# Patient Record
Sex: Female | Born: 1955 | Race: White | Hispanic: No | Marital: Married | Smoking: Former smoker
Health system: Southern US, Community
[De-identification: ages and names within clinical notes are randomized; demographics above are authoritative.]

## PROBLEM LIST (undated history)

## (undated) DIAGNOSIS — E119 Type 2 diabetes mellitus without complications: Secondary | ICD-10-CM

## (undated) DIAGNOSIS — J189 Pneumonia, unspecified organism: Secondary | ICD-10-CM

## (undated) DIAGNOSIS — E282 Polycystic ovarian syndrome: Secondary | ICD-10-CM

## (undated) DIAGNOSIS — I10 Essential (primary) hypertension: Secondary | ICD-10-CM

## (undated) DIAGNOSIS — R011 Cardiac murmur, unspecified: Secondary | ICD-10-CM

## (undated) DIAGNOSIS — K760 Fatty (change of) liver, not elsewhere classified: Secondary | ICD-10-CM

## (undated) DIAGNOSIS — E041 Nontoxic single thyroid nodule: Secondary | ICD-10-CM

## (undated) DIAGNOSIS — T7840XA Allergy, unspecified, initial encounter: Secondary | ICD-10-CM

## (undated) DIAGNOSIS — K219 Gastro-esophageal reflux disease without esophagitis: Secondary | ICD-10-CM

## (undated) DIAGNOSIS — T884XXA Failed or difficult intubation, initial encounter: Secondary | ICD-10-CM

## (undated) DIAGNOSIS — L680 Hirsutism: Secondary | ICD-10-CM

## (undated) DIAGNOSIS — M1990 Unspecified osteoarthritis, unspecified site: Secondary | ICD-10-CM

## (undated) DIAGNOSIS — C50412 Malignant neoplasm of upper-outer quadrant of left female breast: Secondary | ICD-10-CM

## (undated) DIAGNOSIS — R112 Nausea with vomiting, unspecified: Secondary | ICD-10-CM

## (undated) DIAGNOSIS — Z9889 Other specified postprocedural states: Secondary | ICD-10-CM

## (undated) DIAGNOSIS — C50919 Malignant neoplasm of unspecified site of unspecified female breast: Secondary | ICD-10-CM

## (undated) DIAGNOSIS — N920 Excessive and frequent menstruation with regular cycle: Secondary | ICD-10-CM

## (undated) DIAGNOSIS — N926 Irregular menstruation, unspecified: Secondary | ICD-10-CM

## (undated) HISTORY — DX: Cardiac murmur, unspecified: R01.1

## (undated) HISTORY — DX: Polycystic ovarian syndrome: E28.2

## (undated) HISTORY — DX: Irregular menstruation, unspecified: N92.6

## (undated) HISTORY — PX: ABDOMINAL HYSTERECTOMY: SHX81

## (undated) HISTORY — DX: Type 2 diabetes mellitus without complications: E11.9

## (undated) HISTORY — DX: Hirsutism: L68.0

## (undated) HISTORY — PX: CERVICAL CONE BIOPSY: SUR198

## (undated) HISTORY — DX: Pneumonia, unspecified organism: J18.9

## (undated) HISTORY — DX: Essential (primary) hypertension: I10

## (undated) HISTORY — DX: Unspecified osteoarthritis, unspecified site: M19.90

## (undated) HISTORY — DX: Allergy, unspecified, initial encounter: T78.40XA

## (undated) HISTORY — DX: Nontoxic single thyroid nodule: E04.1

## (undated) HISTORY — DX: Malignant neoplasm of unspecified site of unspecified female breast: C50.919

---

## 1979-02-17 HISTORY — PX: DILATION AND CURETTAGE OF UTERUS: SHX78

## 1999-09-30 ENCOUNTER — Encounter: Admission: RE | Admit: 1999-09-30 | Discharge: 1999-10-29 | Payer: Self-pay | Admitting: Occupational Medicine

## 1999-11-07 ENCOUNTER — Encounter: Admission: RE | Admit: 1999-11-07 | Discharge: 2000-02-05 | Payer: Self-pay | Admitting: Occupational Medicine

## 2001-02-17 ENCOUNTER — Encounter: Payer: Self-pay | Admitting: Family Medicine

## 2001-02-17 LAB — CONVERTED CEMR LAB

## 2004-01-22 ENCOUNTER — Emergency Department: Payer: Self-pay | Admitting: General Practice

## 2004-01-25 ENCOUNTER — Ambulatory Visit: Payer: Self-pay | Admitting: Family Medicine

## 2004-02-06 ENCOUNTER — Ambulatory Visit: Payer: Self-pay | Admitting: Family Medicine

## 2004-07-16 ENCOUNTER — Ambulatory Visit: Payer: Self-pay | Admitting: General Surgery

## 2004-07-24 ENCOUNTER — Ambulatory Visit: Payer: Self-pay | Admitting: Family Medicine

## 2004-08-15 ENCOUNTER — Ambulatory Visit: Payer: Self-pay | Admitting: Family Medicine

## 2004-10-03 ENCOUNTER — Ambulatory Visit: Payer: Self-pay | Admitting: Family Medicine

## 2005-03-02 ENCOUNTER — Ambulatory Visit: Payer: Self-pay | Admitting: Family Medicine

## 2005-10-21 ENCOUNTER — Ambulatory Visit: Payer: Self-pay | Admitting: Family Medicine

## 2006-11-29 ENCOUNTER — Encounter: Payer: Self-pay | Admitting: Family Medicine

## 2006-11-29 DIAGNOSIS — N6019 Diffuse cystic mastopathy of unspecified breast: Secondary | ICD-10-CM

## 2006-11-29 DIAGNOSIS — J309 Allergic rhinitis, unspecified: Secondary | ICD-10-CM | POA: Insufficient documentation

## 2007-01-18 ENCOUNTER — Ambulatory Visit: Payer: Self-pay | Admitting: Family Medicine

## 2007-01-18 DIAGNOSIS — E663 Overweight: Secondary | ICD-10-CM

## 2007-01-18 DIAGNOSIS — I10 Essential (primary) hypertension: Secondary | ICD-10-CM

## 2007-01-19 LAB — CONVERTED CEMR LAB
Calcium: 10.2 mg/dL (ref 8.4–10.5)
Cortisol, Plasma: 15.5 ug/dL
GFR calc Af Amer: 98 mL/min
GFR calc non Af Amer: 81 mL/min
TSH: 1.24 microintl units/mL (ref 0.35–5.50)

## 2007-02-24 ENCOUNTER — Ambulatory Visit: Payer: Self-pay | Admitting: Family Medicine

## 2007-02-24 ENCOUNTER — Encounter: Payer: Self-pay | Admitting: Family Medicine

## 2007-02-25 ENCOUNTER — Telehealth: Payer: Self-pay | Admitting: Family Medicine

## 2007-02-28 ENCOUNTER — Ambulatory Visit: Payer: Self-pay | Admitting: Family Medicine

## 2007-02-28 ENCOUNTER — Encounter: Payer: Self-pay | Admitting: Family Medicine

## 2007-03-01 ENCOUNTER — Telehealth: Payer: Self-pay | Admitting: Family Medicine

## 2007-03-02 ENCOUNTER — Encounter (INDEPENDENT_AMBULATORY_CARE_PROVIDER_SITE_OTHER): Payer: Self-pay | Admitting: *Deleted

## 2007-05-20 ENCOUNTER — Ambulatory Visit: Payer: Self-pay | Admitting: Family Medicine

## 2007-05-20 LAB — CONVERTED CEMR LAB
BUN: 11 mg/dL (ref 6–23)
CO2: 30 meq/L (ref 19–32)
Cholesterol: 172 mg/dL (ref 0–200)
Creatinine, Ser: 0.9 mg/dL (ref 0.4–1.2)
Direct LDL: 99.9 mg/dL
HDL: 39 mg/dL (ref >39.0)
Sodium: 141 meq/L (ref 135–145)
Total CHOL/HDL Ratio: 4.4
Triglycerides: 210 mg/dL (ref 0–149)
VLDL: 42 mg/dL — ABNORMAL HIGH (ref 0–40)

## 2007-05-24 ENCOUNTER — Ambulatory Visit: Payer: Self-pay | Admitting: Family Medicine

## 2007-05-24 DIAGNOSIS — M25569 Pain in unspecified knee: Secondary | ICD-10-CM

## 2007-07-14 ENCOUNTER — Ambulatory Visit: Payer: Self-pay | Admitting: Family Medicine

## 2007-07-14 DIAGNOSIS — H1851 Endothelial corneal dystrophy: Secondary | ICD-10-CM

## 2007-07-20 ENCOUNTER — Encounter: Payer: Self-pay | Admitting: Family Medicine

## 2007-07-25 ENCOUNTER — Ambulatory Visit: Payer: Self-pay | Admitting: Family Medicine

## 2007-08-26 ENCOUNTER — Encounter: Payer: Self-pay | Admitting: Family Medicine

## 2007-08-26 ENCOUNTER — Ambulatory Visit: Payer: Self-pay | Admitting: Family Medicine

## 2007-08-30 ENCOUNTER — Encounter (INDEPENDENT_AMBULATORY_CARE_PROVIDER_SITE_OTHER): Payer: Self-pay | Admitting: *Deleted

## 2007-09-13 ENCOUNTER — Ambulatory Visit: Payer: Self-pay | Admitting: Family Medicine

## 2007-09-14 LAB — CONVERTED CEMR LAB
BUN: 10 mg/dL (ref 6–23)
CO2: 27 meq/L (ref 19–32)
Calcium: 10.1 mg/dL (ref 8.4–10.5)
Chloride: 100 meq/L (ref 96–112)
GFR calc Af Amer: 97 mL/min
Magnesium: 2.1 mg/dL (ref 1.5–2.5)
Potassium: 3.7 meq/L (ref 3.5–5.1)

## 2007-10-17 ENCOUNTER — Ambulatory Visit: Payer: Self-pay | Admitting: Family Medicine

## 2007-11-23 ENCOUNTER — Ambulatory Visit: Payer: Self-pay | Admitting: Family Medicine

## 2008-02-27 ENCOUNTER — Ambulatory Visit: Payer: Self-pay | Admitting: Family Medicine

## 2008-02-29 ENCOUNTER — Ambulatory Visit: Payer: Self-pay | Admitting: Family Medicine

## 2008-02-29 ENCOUNTER — Encounter: Payer: Self-pay | Admitting: Family Medicine

## 2008-03-07 ENCOUNTER — Encounter (INDEPENDENT_AMBULATORY_CARE_PROVIDER_SITE_OTHER): Payer: Self-pay | Admitting: *Deleted

## 2008-03-26 ENCOUNTER — Encounter: Payer: Self-pay | Admitting: Family Medicine

## 2008-08-30 ENCOUNTER — Ambulatory Visit: Payer: Self-pay | Admitting: Family Medicine

## 2008-10-04 ENCOUNTER — Ambulatory Visit: Payer: Self-pay | Admitting: Family Medicine

## 2008-10-04 LAB — CONVERTED CEMR LAB
AST: 51 units/L — ABNORMAL HIGH (ref 0–37)
Basophils Absolute: 0 10*3/uL (ref 0.0–0.1)
Basophils Relative: 0.5 % (ref 0.0–3.0)
Bilirubin, Direct: 0 mg/dL (ref 0.0–0.3)
CO2: 28 meq/L (ref 19–32)
Chloride: 101 meq/L (ref 96–112)
Cholesterol: 212 mg/dL — ABNORMAL HIGH (ref 0–200)
Direct LDL: 49.7 mg/dL
Eosinophils Absolute: 0.1 10*3/uL (ref 0.0–0.7)
Eosinophils Relative: 1.2 % (ref 0.0–5.0)
HDL: 35.1 mg/dL — ABNORMAL LOW (ref >39.00)
Hemoglobin: 16 g/dL — ABNORMAL HIGH (ref 12.0–15.0)
Lymphs Abs: 1.6 10*3/uL (ref 0.7–4.0)
Neutro Abs: 6.5 10*3/uL (ref 1.4–7.7)
Potassium: 3.8 meq/L (ref 3.5–5.1)
RBC: 4.89 M/uL (ref 3.87–5.11)
Sodium: 138 meq/L (ref 135–145)
Total CHOL/HDL Ratio: 6
VLDL: 54.8 mg/dL — ABNORMAL HIGH (ref 0.0–40.0)

## 2008-10-09 ENCOUNTER — Ambulatory Visit: Payer: Self-pay | Admitting: Family Medicine

## 2008-10-09 DIAGNOSIS — E559 Vitamin D deficiency, unspecified: Secondary | ICD-10-CM

## 2008-10-11 LAB — CONVERTED CEMR LAB: Vit D, 25-Hydroxy: 7 ng/mL — ABNORMAL LOW (ref 30–89)

## 2008-11-08 ENCOUNTER — Ambulatory Visit: Payer: Self-pay | Admitting: Internal Medicine

## 2008-11-15 ENCOUNTER — Encounter: Payer: Self-pay | Admitting: Family Medicine

## 2008-12-06 ENCOUNTER — Encounter: Admission: RE | Admit: 2008-12-06 | Discharge: 2008-12-06 | Payer: Self-pay | Admitting: Endocrinology

## 2009-01-14 ENCOUNTER — Ambulatory Visit: Payer: Self-pay | Admitting: Family Medicine

## 2009-01-15 LAB — CONVERTED CEMR LAB: Vit D, 25-Hydroxy: 15 ng/mL — ABNORMAL LOW (ref 30–89)

## 2009-01-17 ENCOUNTER — Ambulatory Visit: Payer: Self-pay | Admitting: Family Medicine

## 2009-01-24 ENCOUNTER — Encounter: Admission: RE | Admit: 2009-01-24 | Discharge: 2009-02-13 | Payer: Self-pay | Admitting: Endocrinology

## 2009-03-12 ENCOUNTER — Encounter: Admission: RE | Admit: 2009-03-12 | Discharge: 2009-03-12 | Payer: Self-pay | Admitting: Endocrinology

## 2009-04-22 ENCOUNTER — Telehealth: Payer: Self-pay | Admitting: Family Medicine

## 2009-04-23 ENCOUNTER — Ambulatory Visit: Payer: Self-pay | Admitting: Family Medicine

## 2009-04-24 LAB — CONVERTED CEMR LAB: Vit D, 25-Hydroxy: 25 ng/mL — ABNORMAL LOW (ref 30–89)

## 2009-04-25 ENCOUNTER — Ambulatory Visit: Payer: Self-pay | Admitting: Family Medicine

## 2009-04-26 ENCOUNTER — Encounter: Payer: Self-pay | Admitting: Family Medicine

## 2009-04-26 LAB — HM DIABETES EYE EXAM: HM Diabetic Eye Exam: NORMAL

## 2009-05-06 ENCOUNTER — Encounter: Payer: Self-pay | Admitting: Family Medicine

## 2009-05-09 ENCOUNTER — Encounter: Payer: Self-pay | Admitting: Family Medicine

## 2009-05-09 ENCOUNTER — Ambulatory Visit: Payer: Self-pay | Admitting: Family Medicine

## 2009-05-13 ENCOUNTER — Encounter (INDEPENDENT_AMBULATORY_CARE_PROVIDER_SITE_OTHER): Payer: Self-pay | Admitting: *Deleted

## 2009-05-20 ENCOUNTER — Encounter: Payer: Self-pay | Admitting: Family Medicine

## 2009-06-24 ENCOUNTER — Ambulatory Visit: Payer: Self-pay

## 2009-06-25 ENCOUNTER — Ambulatory Visit: Payer: Self-pay

## 2009-06-25 HISTORY — PX: DILATION AND CURETTAGE OF UTERUS: SHX78

## 2009-07-08 ENCOUNTER — Ambulatory Visit: Payer: Self-pay | Admitting: Family Medicine

## 2009-07-23 ENCOUNTER — Encounter: Admission: RE | Admit: 2009-07-23 | Discharge: 2009-07-23 | Payer: Self-pay | Admitting: Endocrinology

## 2009-09-23 ENCOUNTER — Encounter (INDEPENDENT_AMBULATORY_CARE_PROVIDER_SITE_OTHER): Payer: Self-pay | Admitting: *Deleted

## 2009-10-11 ENCOUNTER — Ambulatory Visit: Payer: Self-pay | Admitting: Gastroenterology

## 2009-10-11 ENCOUNTER — Encounter: Payer: Self-pay | Admitting: Family Medicine

## 2009-10-15 LAB — PATHOLOGY REPORT

## 2009-11-21 ENCOUNTER — Encounter
Admission: RE | Admit: 2009-11-21 | Discharge: 2009-11-21 | Payer: Self-pay | Source: Home / Self Care | Attending: Endocrinology | Admitting: Endocrinology

## 2010-01-14 ENCOUNTER — Ambulatory Visit: Payer: Self-pay | Admitting: Family Medicine

## 2010-01-14 LAB — CONVERTED CEMR LAB
ALT: 33 units/L (ref 0–35)
Albumin: 4.2 g/dL (ref 3.5–5.2)
Alkaline Phosphatase: 61 units/L (ref 39–117)
Basophils Relative: 0.3 % (ref 0.0–3.0)
CO2: 28 meq/L (ref 19–32)
Cholesterol: 226 mg/dL — ABNORMAL HIGH (ref 0–200)
Direct LDL: 115.1 mg/dL
Eosinophils Absolute: 0.1 10*3/uL (ref 0.0–0.7)
HCT: 46.6 % — ABNORMAL HIGH (ref 36.0–46.0)
Hemoglobin: 16.2 g/dL — ABNORMAL HIGH (ref 12.0–15.0)
Lymphs Abs: 1.5 10*3/uL (ref 0.7–4.0)
MCHC: 34.7 g/dL (ref 30.0–36.0)
MCV: 100.4 fL — ABNORMAL HIGH (ref 78.0–100.0)
Monocytes Absolute: 0.7 10*3/uL (ref 0.1–1.0)
Monocytes Relative: 8 % (ref 3.0–12.0)
Neutro Abs: 5.9 10*3/uL (ref 1.4–7.7)
Potassium: 4.5 meq/L (ref 3.5–5.1)
RDW: 12.4 % (ref 11.5–14.6)
TSH: 0.66 microintl units/mL (ref 0.35–5.50)
Total CHOL/HDL Ratio: 5
Triglycerides: 218 mg/dL — ABNORMAL HIGH (ref 0.0–149.0)
WBC: 8.2 10*3/uL (ref 4.5–10.5)

## 2010-01-16 ENCOUNTER — Ambulatory Visit: Payer: Self-pay | Admitting: Family Medicine

## 2010-02-18 ENCOUNTER — Telehealth: Payer: Self-pay | Admitting: Family Medicine

## 2010-03-18 NOTE — Assessment & Plan Note (Signed)
Summary: 4 MONTH FOLLOW UP/[facility]   Vital Signs:  Patient profile:   55 year old female Weight:      212.25 pounds Temp:     99.0 degrees F oral Pulse rate:   80 / minute Pulse rhythm:   regular BP sitting:   148 / 78  (left arm) Cuff size:   large  Vitals Entered By: Sydell Axon LPN (April 25, 2009 3:23 PM) CC: 4 Month follow-up after labs, had nausea and diarrhea Sunday thru Monday   History of Present Illness: Alexandria Fry is a 55 y/o caucasian female who presents today for a 4 month follow up for Vitamin D labs.  7 months ago her Vit D was measured at 7 and 4 months ago was measured at 15.  She's been taking weekly Vit. D supplements (Ergocalciferol) for the last 7 months.  Has missed 2 doses recently, one was forgotten and the other due to illness.  Over the weekend she experienced nausea and vomiting and was instructed to take Immodium.  She took one dose and has since felt much better. However she had blood with her last bout of diarrhea with an internal pop and then two small blood clots and no further blood. She still hurts in the rectal area after sitting or after prolonged standing.  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 Take 1 Tablet  By Mouth Once A Day 2)  Flonase 50 Mcg/act  Susp (Fluticasone Propionate) .... One Squirt Each Nostril Twice A Day 3)  Metoprolol Succinate 50 Mg Xr24h-Tab (Metoprolol Succinate) .... Take 1/2  By Mouth in The Morning and 1 By Mouth in The Evening 4)  Ergocalciferol 50000 Unit Caps (Ergocalciferol) .... One Tab By Mouth Once A Week 5)  Levothyroxine Sodium 50 Mcg Tabs (Levothyroxine Sodium) .... Take One By Mouth Daily 6)  Metformin Hcl 500 Mg Tabs (Metformin Hcl) .... Take One By Mouth Daily  Allergies: 1)  ! Macrobid (Nitrofurantoin Monohyd Macro) 2)  ! Benadryl (Diphenhydramine-Zinc Acetate) 3)  ! Hydrocodone-Acetaminophen (Hydrocodone-Acetaminophen) 4)  ! Tylox (Oxycodone-Acetaminophen) 5)  ! Augmentin  Physical Exam  General:  Well-developed,well-nourished,in no acute distress; alert,appropriate and cooperative throughout examination Head:  Normocephalic and atraumatic without obvious abnormalities. No apparent alopecia or balding. Sinuses NT. Eyes:  Conjunctiva clear bilaterally.  Ears:  External ear exam shows no significant lesions or deformities.  Otoscopic examination reveals clear canals, tympanic membranes are intact bilaterally without bulging, retraction, inflammation or discharge. Hearing is grossly normal bilaterally. Nose:  External nasal examination shows no deformity or inflammation. Nasal mucosa are pink and moist without lesions or exudates. Mouth:  Oral mucosa and oropharynx without lesions or exudates. Pharynx significantly erythematous with some exudate.Teeth in good repair. Neck:  No deformities, masses, or tenderness noted. Lungs:  Normal respiratory effort, chest expands symmetrically. Lungs are clear to auscultation, no crackles or wheezes. Heart:  Normal rate and regular rhythm. S1 and S2 normal without gallop, murmur,  click, rub or other extra sounds. Abdomen:  Bowel sounds positive,abdomen soft and non-tender without masses, organomegaly or hernias noted. Rectal:  Declined.   Impression & Recommendations:  Problem # 1:  DIARRHEA, BLOODY (ICD-787.91) Assessment Improved Gastroenteritis is better but had episode of probable int hemms bleeding and  decompressing. Feel she should be seen by GI for colonoscopy. Orders: Gastroenterology Referral (GI)  Problem # 2:  OTHER SCREENING MAMMOGRAM (ICD-V76.12) Assessment: Unchanged Due mammo and will order. Is currently back to screening. Orders: Radiology Referral (Radiology)  Problem # 3:  UNSPECIFIED VITAMIN D DEFICIENCY (ICD-268.9) Assessment: Improved Vit D therapeutic. Will go to Vit D 1000Iu two times a day and recheck in te future.  Problem # 4:  HYPERTENSION (ICD-401.9) Assessment: Deteriorated Slightly high but wants to hold off increasing meds. Will follow and reassesss next time. Her updated medication list for this problem includes:    Maxzide-25 37.5-25 Mg Tabs (Triamterene-hctz) .Marland Kitchen... Take 1 tablet  by mouth once a day    Metoprolol Succinate 50 Mg Xr24h-tab (Metoprolol succinate) .Marland Kitchen... Take 1/2 by mouth in the morning and 1 by mouth in the evening  BP today: 148/78 Prior BP: 128/84 (01/17/2009)  Labs Reviewed: K+: 3.8 (10/04/2008) Creat: : 0.8 (10/04/2008)   Chol: 212 (10/04/2008)   HDL: 35.10 (10/04/2008)   LDL: DEL (05/20/2007)   TG: 274.0 (10/04/2008)  Complete Medication List: 1)  Maxzide-25 37.5-25 Mg Tabs (Triamterene-hctz) .... Take 1 tablet  by mouth once a day 2)  Flonase 50 Mcg/act Susp (Fluticasone propionate) .... One squirt each nostril twice a day 3)  Metoprolol Succinate 50 Mg Xr24h-tab (Metoprolol succinate) .... Take 1/2 by mouth in the morning and 1 by mouth in the evening 4)  Ergocalciferol 50000 Unit Caps (Ergocalciferol) .... One tab by mouth once a week 5)  Levothyroxine Sodium 50 Mcg Tabs (Levothyroxine sodium) .... Take one by mouth daily 6)  Metformin Hcl 500 Mg Tabs (Metformin hcl) .... Take one by mouth two times a day 7)  Anusol 1-12.5 % Oint (Pramoxine-zinc oxide in mo) .... Use as directed 8)  Anusol-hc 25 Mg Supp (Hydrocortisone acetate) .... Insert per rectum three times a day  Patient Instructions: 1)  Refer for mammo 2)  Go to Vit  D 1000Iu three times a day  3)  RTC Oct for Comp Exam with Pap. Check Vit D then. 4)  Recheck BP. Prescriptions: ANUSOL-HC 25 MG SUPP (HYDROCORTISONE ACETATE) insert Per rectum three times a day  #30 x 0   Entered and Authorized by:   Shaune Leeks MD   Signed by:   Shaune Leeks MD on 04/25/2009   Method used:   Electronically to        [id number] (retail)       [address]       [place], [place]  [address]       Ph: [phone]       Fax: [phone]   RxID:   [id number] ANUSOL 1-12.5 % OINT (PRAMOXINE-ZINC OXIDE IN MO) use as directed  #30 x 0   Entered and Authorized by:   Shaune Leeks MD   Signed by:   Shaune Leeks MD on 04/25/2009   Method used:   Electronically to        [id number] (retail)       [address]       [place], [place]  [address]       Ph:  [phone]       Fax: [phone]   RxID:   [id number]   Current Allergies (reviewed today): ! MACROBID (NITROFURANTOIN MONOHYD MACRO) ! BENADRYL (DIPHENHYDRAMINE-ZINC ACETATE) ! HYDROCODONE-ACETAMINOPHEN (HYDROCODONE-ACETAMINOPHEN) ! TYLOX (OXYCODONE-ACETAMINOPHEN) ! AUGMENTIN

## 2010-03-18 NOTE — Consult Note (Signed)
Summary: Alexandria Fry  Vikki Metheny,ANP-BC,GI,Note   Imported By: Beau Fanny 05/31/2009 10:29:09  _____________________________________________________________________  External Attachment:    Type:   Image     Comment:   External Document

## 2010-03-18 NOTE — Letter (Signed)
Summary: [facility],Note  Dr.Bindubal Balan,[facility],Note   Imported By: Beau Fanny 05/13/2009 16:42:19  _____________________________________________________________________  External Attachment:    Type:   Image     Comment:   External Document

## 2010-03-18 NOTE — Procedures (Signed)
Summary: Colonoscopy by Dr.Martin [facility]  Colonoscopy by Dr.Martin [facility]   Imported By: Beau Fanny 10/16/2009 16:30:57  _____________________________________________________________________  External Attachment:    Type:   Image     Comment:   External Document  Appended Document: Colonoscopy by Dr.Martin [facility]    Clinical Lists Changes  Observations: Added new observation of COLPO: 4 polyps removed  (10/11/2009 20:42)       Colposcopy  Procedure date:  10/11/2009  Findings:      4 polyps removed

## 2010-03-18 NOTE — Assessment & Plan Note (Signed)
Summary: CPX/CLE   Vital Signs:  Patient profile:   55 year old female Weight:      191.75 pounds Temp:     98.5 degrees F oral Pulse rate:   60 / minute Pulse rhythm:   regular BP sitting:   146 / 76  (left arm) Cuff size:   large  Vitals Entered By: Sydell Axon LPN 02-03-10 9:20 AM) CC: 30  Minute checkup, sees Dr. Luella Cook for her GYN care   History of Present Illness: Pt here for Comp Exam. She has lost almost 30 pounds.  Is walking and eating more carefully.  She has had colonoscopy and D&C, had propofalol as anesthetic and had bad sxs afterward...vertigo was the worst. It finally wore off and she is now feeling back to nml. She today is having probs with the right ear with accentuated heartbeat. She also has left shoulder pain generally.   Preventive Screening-Counseling & Management  Alcohol-Tobacco     Alcohol drinks/day: 2     Alcohol type: wine, mixed drink     Smoking Status: never     Passive Smoke Exposure: no  Caffeine-Diet-Exercise     Caffeine use/day: 1     Does Patient Exercise: yes     Type of exercise: walking     Exercise (avg: min/session): 30-60     Times/week: 7  Problems Prior to Update: 1)  Allergic Reaction (PRINCIPALLY SULFA)  (ICD-995.3) 2)  Diarrhea, Bloody  (ICD-787.91) 3)  Other Screening Mammogram  (ICD-V76.12) 4)  Hypertension  (ICD-401.9) 5)  Health Maintenance Exam  (ICD-V70.0) 6)  Unspecified Vitamin D Deficiency  (ICD-268.9) 7)  Ingrown Nail, R Gt Toe  (ICD-703.0) 8)  Strep Throat  (ICD-034.0) 9)  Fuch's Dystrophy  (ICD-371.57) 10)  Knee Pain, Left, Chronic  (ICD-719.46) 11)  Hypertension, Benign Essential  (ICD-401.1) 12)  Overweight  (ICD-278.02) 13)  Fibrocystic Breast Disease  (ICD-610.1) 14)  Mitral Valve Prolapse, Negative By Echo  (ICD-424.0) 15)  Allergic Rhinitis  (ICD-477.9)  Medications Prior to Update: 1)  Flonase 50 Mcg/act  Susp (Fluticasone Propionate) .... One Squirt Each Nostril Twice A Day 2)   Metoprolol Succinate 50 Mg Xr24h-Tab (Metoprolol Succinate) .... Take 1/2 By Mouth in The Morning and 1 By Mouth in The Evening 3)  Levothyroxine Sodium 50 Mcg Tabs (Levothyroxine Sodium) .... Take One By Mouth Daily 4)  Vitamin D 1000 Unit Tabs (Cholecalciferol) .... Take 3 By Mouth Daily  Current Medications (verified): 1)  Flonase 50 Mcg/act  Susp (Fluticasone Propionate) .... One Squirt Each Nostril Twice A Day 2)  Metoprolol Succinate 50 Mg Xr24h-Tab (Metoprolol Succinate) .... Take One By Mouth Two Times A Day 3)  Levothyroxine Sodium 50 Mcg Tabs (Levothyroxine Sodium) .... Take One By Mouth Daily 4)  Vitamin D 1000 Unit Tabs (Cholecalciferol) .... Take 3 By Mouth Daily  Allergies: 1)  ! Macrobid (Nitrofurantoin Monohyd Macro) 2)  ! Benadryl (Diphenhydramine-Zinc Acetate) 3)  ! Hydrocodone-Acetaminophen (Hydrocodone-Acetaminophen) 4)  ! Tylox (Oxycodone-Acetaminophen) 5)  ! Augmentin 6)  ! Sulfa 7)  ! Maxzide (Triamterene-Hctz) 8)  ! Bactrim Ds (Sulfamethoxazole-Trimethoprim)  Past History:  Past Medical History: Last updated: 11/08/2008 Allergic rhinitis Hypertension  Family History: Last updated: 02-03-10 Father: Died at age 16, pancreatic cancer,  he also had colon polyps Mother: A 26  Breast cancer with a mastectomy at age 60 that was hormone receptor positive.  She also had rectal cancer at age 11 with a proctectomy, ileostomy and colectomy. Multiple Polyps.  DM Brother A 50 Sister A 51 CV - none HBP + Mother DM + Mother Pancreatic cancer + Father Breast cancer + Mother Colon cancer + Mother Depression - none ETOH/Drug abuse + Father/ GF Strokes - none  Social History: Last updated: 11/29/2006 Marital Status: Married Children: 2 Occupation: Production manager for [employer]  Risk Factors: Alcohol Use: 2 (01/16/2010) Caffeine Use: 1 (01/16/2010) Exercise: yes (01/16/2010)  Risk Factors: Smoking Status: never (01/16/2010) Passive Smoke Exposure: no  (01/16/2010)  Past Surgical History: NSVD x 2 D&C [facility] BX 1981 ECHO, WNL, no MVP 06/15/00 D&C Nml (Dr Luella Cook) 06/25/2009 Colonoscopy  3 polyps benign (Dr Marva Panda) 10/11/2009  Family History: Father: Died at age 85, pancreatic cancer,  he also had colon polyps Mother: A 49  Breast cancer with a mastectomy at age 69 that was hormone receptor positive.  She also had rectal cancer at age 66 with a proctectomy, ileostomy and colectomy. Multiple Polyps. DM Brother A 68 Sister A 51 CV - none HBP + Mother DM + Mother Pancreatic cancer + Father Breast cancer + Mother Colon cancer + Mother Depression - none ETOH/Drug abuse + Father/ GF Strokes - none  Social History: Caffeine use/day:  1 Does Patient Exercise:  yes  Review of Systems General:  Denies chills, fatigue, fever, sweats, weakness, and weight loss. Eyes:  Denies blurring, discharge, and eye pain. ENT:  Denies decreased hearing, earache, nosebleeds, and ringing in ears; fullness in right ear.. CV:  Complains of swelling of feet; denies chest pain or discomfort, fainting, fatigue, palpitations, shortness of breath with exertion, and swelling of hands; improved, L swells more than right. Resp:  Denies cough, pleuritic, and wheezing. GI:  Denies abdominal pain, bloody stools, change in bowel habits, constipation, dark tarry stools, diarrhea, indigestion, loss of appetite, nausea, vomiting, vomiting blood, and yellowish skin color; occa BRBPR from fissure.. GU:  Denies discharge, dysuria, nocturia, and urinary frequency. MS:  Complains of joint pain and stiffness; denies low back pain, muscle aches, and cramps; left shoulder. Derm:  Complains of dryness; denies itching and rash; improved. Neuro:  Denies numbness, poor balance, tingling, and tremors; middle fingers sometimes go numb.Marland Kitchen  Physical Exam  General:  Well-developed,well-nourished,in no acute distress; alert,appropriate and cooperative throughout examination Head:   Normocephalic and atraumatic without obvious abnormalities. No apparent alopecia or balding. Sinuses NT. Eyes:  Conjunctiva clear bilaterally.  Ears:  External ear exam shows no significant lesions or deformities.  Otoscopic examination reveals clear canals, tympanic membranes are intact bilaterally without bulging, retraction, inflammation or discharge. Hearing is grossly normal bilaterally. L TM mildly dull to LR. Nose:  External nasal examination shows no deformity or inflammation. Nasal mucosa are pink and moist without lesions or exudates. Mouth:  Oral mucosa and oropharynx without lesions or exudates. Pharynx significantly erythematous with some exudate.Teeth in good repair. Neck:  No deformities, masses, or tenderness noted. Chest Wall:  No deformities, masses, or tenderness noted. Breasts:  per Dr Luella Cook Lungs:  Normal respiratory effort, chest expands symmetrically. Lungs are clear to auscultation, no crackles or wheezes. Good air exchange. Heart:  Normal rate and regular rhythm. S1 and S2 normal without gallop, murmur, click, rub or other extra sounds. Abdomen:  Bowel sounds positive,abdomen soft and non-tender without masses, organomegaly or hernias noted. Rectal:  per Dr Fara Olden:  per Dr Luella Cook. Msk:  No deformity or scoliosis noted of thoracic or lumbar spine.   Pulses:  R and L carotid,radial,femoral,dorsalis pedis and posterior tibial pulses are full and equal  bilaterally Extremities:  L shoulder with slightly decr ROM, mostly extension and ext rotation. Neurologic:  No cranial nerve deficits noted. Station and gait are normal. Sensory, motor and coordinative functions appear intact. Skin:  Intact without suspicious lesions or rashes, a few focally erythem and swollen patches on the left wrist and arms. Cervical Nodes:  No lymphadenopathy noted Inguinal Nodes:  No significant adenopathy Psych:  Cognition and judgment appear intact. Alert and cooperative with normal  attention span and concentration. No apparent delusions, illusions, hallucinations   Impression & Recommendations:  Problem # 1:  HEALTH MAINTENANCE EXAM (ICD-V70.0) Assessment Comment Only  Problem # 2:  HYPERTENSION, BENIGN ESSENTIAL (ICD-401.1) Assessment: Deteriorated Will observe. She takes her BP at home and has been stable. Her updated medication list for this problem includes:    Metoprolol Succinate 50 Mg Xr24h-tab (Metoprolol succinate) .Marland Kitchen... Take one by mouth two times a day  BP today: 146/76 Prior BP: 124/74 (07/08/2009)  Labs Reviewed: K+: 4.5 (01/14/2010) Creat: : 0.7 (01/14/2010)   Chol: 226 (01/14/2010)   HDL: 44.10 (01/14/2010)   LDL: DEL (05/20/2007)   TG: 218.0 (01/14/2010)  Problem # 3:  UNSPECIFIED VITAMIN D DEFICIENCY (ICD-268.9) Assessment: Unchanged Minimally decreased. Try to remember better to take the pill.  Problem # 4:  INGROWN NAIL, R GT TOE (ICD-703.0) Assessment: Unchanged She thinks it may need partial removal again with phenol trmt to the matrix.  Problem # 5:  KNEE PAIN, LEFT, CHRONIC (ICD-719.46) Assessment: Unchanged Discussed chronic exercises to do.   Problem # 6:  OVERWEIGHT (ICD-278.02) Assessment: Improved Has lost weight by hard work. Congrats! Cont efforts.   BP today: 146/76 Prior BP: 124/74 (07/08/2009)  Labs Reviewed: K+: 4.5 (01/14/2010) Creat: : 0.7 (01/14/2010)   Chol: 226 (01/14/2010)   HDL: 44.10 (01/14/2010)   LDL: DEL (05/20/2007)   TG: 218.0 (01/14/2010)  Problem # 7:  ALLERGIC RHINITIS (ICD-477.9) Assessment: Unchanged Cont meds as needed. Her updated medication list for this problem includes:    Flonase 50 Mcg/act Susp (Fluticasone propionate) ..... One squirt each nostril twice a day  Discussed use of allergy medications and environmental measures.   Complete Medication List: 1)  Flonase 50 Mcg/act Susp (Fluticasone propionate) .... One squirt each nostril twice a day 2)  Metoprolol Succinate 50 Mg  Xr24h-tab (Metoprolol succinate) .... Take one by mouth two times a day 3)  Levothyroxine Sodium 50 Mcg Tabs (Levothyroxine sodium) .... Take one by mouth daily 4)  Vitamin D 1000 Unit Tabs (Cholecalciferol) .... Take 3 by mouth daily  Other Orders: Tdap => 72yrs IM (30865) Admin 1st Vaccine (78469)  Patient Instructions: 1)  RTC 6 mos, sooner as needed.   Orders Added: 1)  Tdap => 69yrs IM [90715] 2)  Admin 1st Vaccine [90471] 3)  Est. Patient 40-64 years [99396]   Immunization History:  Influenza Immunization History:    Influenza:  historical (11/27/2009)  Immunizations Administered:  Tetanus Vaccine:    Vaccine Type: Tdap    Site: left deltoid    Mfr: GlaxoSmithKline    Dose: 0.5 ml    Route: IM    Given by: Mervin Hack CMA (AAMA)    Exp. Date: 12/06/2011    Lot #: [id number]    VIS given: 01/04/08 version given January 16, 2010.   Immunization History:  Influenza Immunization History:    Influenza:  Historical (11/27/2009)  Immunizations Administered:  Tetanus Vaccine:    Vaccine Type: Tdap    Site: left deltoid    Mfr:  GlaxoSmithKline    Dose: 0.5 ml    Route: IM    Given by: Mervin Hack CMA (AAMA)    Exp. Date: 12/06/2011    Lot #: [id number]    VIS given: 01/04/08 version given January 16, 2010.  Current Allergies (reviewed today): ! MACROBID (NITROFURANTOIN MONOHYD MACRO) ! BENADRYL (DIPHENHYDRAMINE-ZINC ACETATE) ! HYDROCODONE-ACETAMINOPHEN (HYDROCODONE-ACETAMINOPHEN) ! TYLOX (OXYCODONE-ACETAMINOPHEN) ! AUGMENTIN ! SULFA ! MAXZIDE (TRIAMTERENE-HCTZ) ! BACTRIM DS (SULFAMETHOXAZOLE-TRIMETHOPRIM)

## 2010-03-18 NOTE — Progress Notes (Signed)
Summary: diarrhea  Phone Note Call from Patient Call back at Home Phone [phone] Call back at [phone]   Caller: Patient Call For: Shaune Leeks MD Summary of Call: Pt has been on metformin since december, dose was increased in january.  She has had some diarrhea since starting this but developed watery diarrhea yesterday.  She is asking what she can take for this.  She wants to take kayopectate, but bottle says not to take if diabetic.  Please advise. Initial call taken by: Lowella Petties CMA,  April 22, 2009 8:11 AM  Follow-up for Phone Call        Use Immodium, 2 to start and one every 4-6 hrs if diarrhea conts. Every day is new cycle, start with 2 in AM. See me if continues. Follow-up by: Shaune Leeks MD,  April 22, 2009 8:43 AM  Additional Follow-up for Phone Call Additional follow up Details #1::        Advised pt. Additional Follow-up by: Lowella Petties CMA,  April 22, 2009 9:46 AM

## 2010-03-18 NOTE — Letter (Signed)
Summary: Dr.Frank Spaeth,[facility],Primary Care Provider Repo  Dr.Frank Spaeth,[facility] Care Provider Report   Imported By: Beau Fanny 05/31/2009 16:24:00  _____________________________________________________________________  External Attachment:    Type:   Image     Comment:   External Document  Appended Document: Dr.Frank Spaeth,[facility] Care Provider Repo    Clinical Lists Changes  Observations: Added new observation of DMEYEEXAMNXT: 04/2010 (06/02/2009 13:52) Added new observation of DMEYEEXMRES: normal (04/26/2009 13:53) Added new observation of EYE EXAM BY: Dr Marlyne Beards (04/26/2009 13:53) Added new observation of DIAB EYE EX: normal (04/26/2009 13:53)        Diabetes Management Exam:    Eye Exam:       Eye Exam done elsewhere          Date: 04/26/2009          Results: normal          Done by: Dr Marlyne Beards

## 2010-03-18 NOTE — Letter (Signed)
Summary: Results Follow up Letter  [place] [facility]  [address] [place], [place] [address]   Phone: [phone]  Fax: [phone]    05/13/2009 MRN: [id number]     Alexandria Fry [address] [place], [place]  [address]    Dear Alexandria Fry,  The following are the results of your recent test(s):  Test         Result    Pap Smear:        Normal _____  Not Normal _____ Comments: ______________________________________________________ Cholesterol: LDL(Bad cholesterol):         Your goal is less than:         HDL (Good cholesterol):       Your goal is more than: Comments:  ______________________________________________________ Mammogram:        Normal __x___  Not Normal _____ Comments:Repeat in 1 year  ___________________________________________________________________ Hemoccult:        Normal _____  Not normal _______ Comments:    _____________________________________________________________________ Other Tests:    We routinely do not discuss normal results over the telephone.  If you desire a copy of the results, or you have any questions about this information we can discuss them at your next office visit.   Sincerely,    Laurita Quint MD

## 2010-03-18 NOTE — Letter (Signed)
Summary: Alexandria Fry letter  [place] [facility]  [address] [place], [place] [address]   Phone: [phone]  Fax: [phone]       09/23/2009 MRN: [id number]  LYNNMARIE LOVETT [address] [place], [place]  [address]  Dear Ms. [facility] [place], and [facility] announce the retirement of Arta Silence, M.D., from full-time practice at the [facility] office effective August 15, 2009 and his plans of returning part-time.  It is important to Dr. Hetty Ely and to our practice that you understand that [facility] has seven physicians in our office for your health care needs.  We will continue to offer the same exceptional care that you have today.    Dr. Hetty Ely has spoken to many of you about his plans for retirement and returning part-time in the fall.   We will continue to work with you through the transition to schedule appointments for you in the office and meet the high standards that [facility] is committed to.   Again, it is with great pleasure that we share the news that Dr. Hetty Ely will return to [facility] at [facility] in October of 2011 with a reduced schedule.    If you have any questions, or would like to request an appointment with one of our physicians, please call us at [phone] and press the option for Scheduling an appointment.  We take pleasure in providing you with excellent patient care and look forward to seeing you at your next office visit.  Our [facility] Physicians are:  Tillman Abide, M.D. Laurita Quint, M.D. Roxy Manns, M.D. Kerby Nora, M.D. Hannah Beat, M.D. Ruthe Mannan, M.D. We proudly welcomed Raechel Ache, M.D. and Eustaquio Boyden, M.D. to the practice in July/August 2011.  Sincerely,  [facility]

## 2010-03-18 NOTE — Assessment & Plan Note (Signed)
Summary: ?ALLERGIC REACTION TO MEDICATION/CE   Vital Signs:  Patient profile:   55 year old female Weight:      206.50 pounds BMI:     34.49 Temp:     98.8 degrees F oral Pulse rate:   64 / minute Pulse rhythm:   regular BP sitting:   124 / 74  (left arm) Cuff size:   large  Vitals Entered By: Sydell Axon LPN (Jul 08, 2009 8:54 AM) CC: ? Reaction to Maxzide, hives and tightness in chest   History of Present Illness: Pt here for recheck. She had to take Bactrim Ds for a UTI and had a bad reaction to it. Then really has had reaction to Metformin and Maxzide. She has been off all of these and is on only Metoprolol. She ihas been and now again proven that she is sensitive to multiple moities, what seems principally sulfa in any and all forms. She has mini37malk to no swelling this AM. She recently had D&C for mennomettroragia with benign findings.  Problems Prior to Update: 1)  Diarrhea, Bloody  (ICD-787.91) 2)  Other Screening Mammogram  (ICD-V76.12) 3)  Hypertension  (ICD-401.9) 4)  Health Maintenance Exam  (ICD-V70.0) 5)  Unspecified Vitamin D Deficiency  (ICD-268.9) 6)  Ingrown Nail, R Gt Toe  (ICD-703.0) 7)  Strep Throat  (ICD-034.0) 8)  Fuch's Dystrophy  (ICD-371.57) 9)  Knee Pain, Left, Chronic  (ICD-719.46) 10)  Hypertension, Benign Essential  (ICD-401.1) 11)  Overweight  (ICD-278.02) 12)  Fibrocystic Breast Disease  (ICD-610.1) 13)  Mitral Valve Prolapse, Negative By Echo  (ICD-424.0) 14)  Allergic Rhinitis  (ICD-477.9)  Medications Prior to Update: 1)  Maxzide-25 37.5-25 Mg  Tabs (Triamterene-Hctz) .... Take 1 Tablet  By Mouth Once A Day 2)  Flonase 50 Mcg/act  Susp (Fluticasone Propionate) .... One Squirt Each Nostril Twice A Day 3)  Metoprolol Succinate 50 Mg Xr24h-Tab (Metoprolol Succinate) .... Take 1/2 By Mouth in The Morning and 1 By Mouth in The Evening 4)  Ergocalciferol 50000 Unit Caps (Ergocalciferol) .... One Tab By Mouth Once A Week 5)  Levothyroxine  Sodium 50 Mcg Tabs (Levothyroxine Sodium) .... Take One By Mouth Daily 6)  Metformin Hcl 500 Mg Tabs (Metformin Hcl) .... Take One By Mouth Two Times A Day 7)  Anusol 1-12.5 % Oint (Pramoxine-Zinc Oxide in Mo) .... Use As Directed 8)  Anusol-Hc 25 Mg Supp (Hydrocortisone Acetate) .... Insert Per Rectum Three Times A Day  Allergies: 1)  ! Macrobid (Nitrofurantoin Monohyd Macro) 2)  ! Benadryl (Diphenhydramine-Zinc Acetate) 3)  ! Hydrocodone-Acetaminophen (Hydrocodone-Acetaminophen) 4)  ! Tylox (Oxycodone-Acetaminophen) 5)  ! Augmentin 6)  ! Sulfa 7)  ! Maxzide (Triamterene-Hctz) 8)  ! Bactrim Ds (Sulfamethoxazole-Trimethoprim)  Physical Exam  General:  Well-developed,well-nourished,in no acute distress; alert,appropriate and cooperative throughout examination Head:  Normocephalic and atraumatic without obvious abnormalities. No apparent alopecia or balding. Sinuses NT. Eyes:  Conjunctiva clear bilaterally.  Ears:  External ear exam shows no significant lesions or deformities.  Otoscopic examination reveals clear canals, tympanic membranes are intact bilaterally without bulging, retraction, inflammation or discharge. Hearing is grossly normal bilaterally. Nose:  External nasal examination shows no deformity or inflammation. Nasal mucosa are pink and moist without lesions or exudates. Mouth:  Oral mucosa and oropharynx without lesions or exudates. Pharynx significantly erythematous with some exudate.Teeth in good repair. Neck:  No deformities, masses, or tenderness noted. Lungs:  Normal respiratory effort, chest expands symmetrically. Lungs are clear to auscultation, no crackles or wheezes. Heart:  Normal rate and regular rhythm. S1 and S2 normal without gallop, murmur, click, rub or other extra sounds. Skin:  Intact without suspicious lesions or rashes, a few focally erythem and swollen patches on the left wrist and arms.   Impression & Recommendations:  Problem # 1:  ALLERGIC REACTION  (PRINCIPALLY SULFA) (ICD-995.3) Assessment New Probably has caused most of the events she has recently experienced. She is now off most meds. Cont as is. Avoid most diuretics. Avoid AceIs as she had anaphylactic reaction to those in the past.  Problem # 2:  HYPERTENSION (ICD-401.9) Assessment: Improved Cont on only Metoprpolol. The following medications were removed from the medication list:    Maxzide-25 37.5-25 Mg Tabs (Triamterene-hctz) .Marland Kitchen... Take 1 tablet  by mouth once a day Her updated medication list for this problem includes:    Metoprolol Succinate 50 Mg Xr24h-tab (Metoprolol succinate) .Marland Kitchen... Take 1/2 by mouth in the morning and 1 by mouth in the evening  BP today: 124/74 Prior BP: 148/78 (04/25/2009)  Labs Reviewed: K+: 3.8 (10/04/2008) Creat: : 0.8 (10/04/2008)   Chol: 212 (10/04/2008)   HDL: 35.10 (10/04/2008)   LDL: DEL (05/20/2007)   TG: 274.0 (10/04/2008)  Complete Medication List: 1)  Flonase 50 Mcg/act Susp (Fluticasone propionate) .... One squirt each nostril twice a day 2)  Metoprolol Succinate 50 Mg Xr24h-tab (Metoprolol succinate) .... Take 1/2 by mouth in the morning and 1 by mouth in the evening 3)  Levothyroxine Sodium 50 Mcg Tabs (Levothyroxine sodium) .... Take one by mouth daily 4)  Vitamin D 1000 Unit Tabs (Cholecalciferol) .... Take 3 by mouth daily  Patient Instructions: 1)  RTC as needed. 2)  F/U with Dr Talmage Nap as scheduled.  Current Allergies (reviewed today): ! MACROBID (NITROFURANTOIN MONOHYD MACRO) ! BENADRYL (DIPHENHYDRAMINE-ZINC ACETATE) ! HYDROCODONE-ACETAMINOPHEN (HYDROCODONE-ACETAMINOPHEN) ! TYLOX (OXYCODONE-ACETAMINOPHEN) ! AUGMENTIN ! SULFA ! MAXZIDE (TRIAMTERENE-HCTZ) ! BACTRIM DS (SULFAMETHOXAZOLE-TRIMETHOPRIM)

## 2010-03-20 NOTE — Progress Notes (Signed)
Summary: Rx Metoprolol  Phone Note Refill Request Call back at [phone] Message [facility]:  [facility] on February 18, 2010 8:25 AM  Received refill request for Metoprolol 50 mg, take 1/2 in the morning and 1 in the pm. Does not match med sheet. It appears that medication was changed at last visit.   Method Requested: Electronic Initial call taken by: Sydell Axon LPN,  February 18, 2010 8:27 AM    Prescriptions: METOPROLOL SUCCINATE 50 MG XR24H-TAB (METOPROLOL SUCCINATE) Take one by mouth two times a day  #60 x 12   Entered and Authorized by:   Shaune Leeks MD   Signed by:   Shaune Leeks MD on 02/18/2010   Method used:   [facility] to        [id number]       [address]       [place], [place]  [address]       Ph: [phone]       Fax: [phone]   RxID:   [id number]

## 2010-03-25 ENCOUNTER — Encounter: Payer: Managed Care, Other (non HMO) | Attending: Endocrinology | Admitting: *Deleted

## 2010-03-25 ENCOUNTER — Encounter: Admit: 2010-03-25 | Payer: Self-pay | Admitting: Endocrinology

## 2010-03-25 DIAGNOSIS — E669 Obesity, unspecified: Secondary | ICD-10-CM | POA: Insufficient documentation

## 2010-03-25 DIAGNOSIS — Z713 Dietary counseling and surveillance: Secondary | ICD-10-CM | POA: Insufficient documentation

## 2010-04-04 ENCOUNTER — Telehealth: Payer: Self-pay | Admitting: Family Medicine

## 2010-04-15 NOTE — Progress Notes (Signed)
Summary: order for mammogram   Phone Note Call from Patient Call back at Work Phone [phone]   Caller: Patient Call For: Shaune Leeks MD Summary of Call: Patient is due for a mammogram. She goes to [facility]. She would like early morning appt.  Initial call taken by: Melody Comas,  April 04, 2010 9:00 AM  Follow-up for Phone Call        [facility] on 05/14/2010 at 7:30am. Order faxed. Follow-up by: Carlton Adam,  April 09, 2010 4:16 PM

## 2010-05-14 ENCOUNTER — Ambulatory Visit: Payer: Self-pay | Admitting: Family Medicine

## 2010-05-16 ENCOUNTER — Telehealth: Payer: Self-pay | Admitting: *Deleted

## 2010-05-16 NOTE — Telephone Encounter (Signed)
Agree with below.  rec stop canned water, could very well be reaction if had sulfa in it.  If swelling worsening, will need urgent eval.  Agree with [facility] or ER.

## 2010-05-16 NOTE — Telephone Encounter (Signed)
Patient called and said she was concerned that her tongue was swelling. She said she is allergic to Sulfa drugs and had been drinking some canned water up until Wednesday when she noticed that it contained sulfonamides. She said she has had an increasing metallic taste in her mouth since she began drinking it and then her tongue was swollen today. She denies any difficulty breathing, swallowing, SOB but said she had hives a couple of days ago. She said that her tongue had previously been swollen before when her thyroid was found to be low and is wondering if that may have something to do with it now. I told her that I couldn't tell her that over the phone and suggested that she seek urgent care just because her tongue is swollen. I explained that it could continue to swell and cause problems and that she should go for immediate evaluation. She verbalized understanding but said she may hold off just because she wasn't currently having any problems. I advised her again that problems could start quickly if indeed it was some type of allergic reaction. I told her I didn't think it was the water from 2 days ago, but possibly from something today. She said she can't take benadryl due to allergy to that. She said she understood and would probably go get looked at. This is FYI only, but I told her I would call her back if there was anything any different that I needed to tell her.

## 2010-05-20 ENCOUNTER — Encounter: Payer: Self-pay | Admitting: Family Medicine

## 2010-05-21 ENCOUNTER — Ambulatory Visit (INDEPENDENT_AMBULATORY_CARE_PROVIDER_SITE_OTHER): Payer: Managed Care, Other (non HMO) | Admitting: Family Medicine

## 2010-05-21 ENCOUNTER — Encounter: Payer: Self-pay | Admitting: Family Medicine

## 2010-05-21 VITALS — BP 110/72 | HR 60 | Temp 99.0°F | Ht 65.0 in | Wt 191.8 lb

## 2010-05-21 DIAGNOSIS — T7840XA Allergy, unspecified, initial encounter: Secondary | ICD-10-CM

## 2010-05-21 NOTE — Progress Notes (Signed)
S: Pt here for allergic reaction. She had tried to decrease carbs and she started drinking a carbonated water and finally realized she was having a reaction to the water with an overwhelming metallic taste which eventually became so reactive that she had some swelling of the throat eventually. 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 She finally had a bigger reaction at work last Friday where her tongue swelled and she went to [facility] and was seen. She was given DepoMedrol 80mg  IM and OTC Zyrtec at night since. She has continued with a muscle soreness of the back of the throat toeward the end of the dosing cycle of the Zyrtec. She has had some swelling percieved of the upper eyelid on the right. She has been using cinnamon and cloves daily. The cloves kind of make her tongue burn a lot like the burning she had with the reaction she had. O:  WDWN WF NAD        HEENT WNL except mild swelling upper right eyelid, Tongue appears slightly swollen generally, No nodes felt.       Heart RRR       Lungs CTA A: Presumed allergic reaction to carbonated water with sulfates P: Hold steroids for now, unless referral more than two weeks. Would pulse with 50mg  Prednisone daily if grtr than two weeks.     Incr Zyrtec to bid every twelve hours for a few weeks.     RTC if sxs worsen, to ER for acutwe breathing problems     Has Epi pen.

## 2010-05-21 NOTE — Patient Instructions (Addendum)
Refer to allergy

## 2010-06-24 ENCOUNTER — Ambulatory Visit: Payer: Self-pay | Admitting: Dietician

## 2011-01-13 ENCOUNTER — Other Ambulatory Visit: Payer: Self-pay | Admitting: Family Medicine

## 2011-01-13 DIAGNOSIS — I10 Essential (primary) hypertension: Secondary | ICD-10-CM

## 2011-01-13 DIAGNOSIS — D751 Secondary polycythemia: Secondary | ICD-10-CM

## 2011-01-13 DIAGNOSIS — R739 Hyperglycemia, unspecified: Secondary | ICD-10-CM

## 2011-01-13 DIAGNOSIS — E559 Vitamin D deficiency, unspecified: Secondary | ICD-10-CM

## 2011-01-13 DIAGNOSIS — E781 Pure hyperglyceridemia: Secondary | ICD-10-CM

## 2011-01-19 ENCOUNTER — Other Ambulatory Visit (INDEPENDENT_AMBULATORY_CARE_PROVIDER_SITE_OTHER): Payer: Managed Care, Other (non HMO)

## 2011-01-19 DIAGNOSIS — E559 Vitamin D deficiency, unspecified: Secondary | ICD-10-CM

## 2011-01-19 DIAGNOSIS — E781 Pure hyperglyceridemia: Secondary | ICD-10-CM

## 2011-01-19 DIAGNOSIS — R7309 Other abnormal glucose: Secondary | ICD-10-CM

## 2011-01-19 DIAGNOSIS — D751 Secondary polycythemia: Secondary | ICD-10-CM

## 2011-01-19 DIAGNOSIS — R739 Hyperglycemia, unspecified: Secondary | ICD-10-CM

## 2011-01-19 LAB — CBC WITH DIFFERENTIAL/PLATELET
Basophils Absolute: 0 10*3/uL (ref 0.0–0.1)
Eosinophils Absolute: 0.1 10*3/uL (ref 0.0–0.7)
Lymphocytes Relative: 17.5 % (ref 12.0–46.0)
MCV: 99.6 fl (ref 78.0–100.0)
Neutro Abs: 5.7 10*3/uL (ref 1.4–7.7)
Neutrophils Relative %: 73.1 % (ref 43.0–77.0)
Platelets: 208 10*3/uL (ref 150.0–400.0)
RDW: 12.6 % (ref 11.5–14.6)
WBC: 7.7 10*3/uL (ref 4.5–10.5)

## 2011-01-19 LAB — HEPATIC FUNCTION PANEL
ALT: 28 U/L (ref 0–35)
Albumin: 4.3 g/dL (ref 3.5–5.2)
Alkaline Phosphatase: 53 U/L (ref 39–117)
Bilirubin, Direct: 0 mg/dL (ref 0.0–0.3)

## 2011-01-19 LAB — RENAL FUNCTION PANEL
BUN: 19 mg/dL (ref 6–23)
Chloride: 104 mEq/L (ref 96–112)
GFR: 81.59 mL/min (ref >60.00)
Glucose, Bld: 104 mg/dL — ABNORMAL HIGH (ref 70–99)
Phosphorus: 3 mg/dL (ref 2.3–4.6)
Sodium: 140 mEq/L (ref 135–145)

## 2011-01-19 LAB — LIPID PANEL
Cholesterol: 238 mg/dL — ABNORMAL HIGH (ref 0–200)
HDL: 49.2 mg/dL (ref >39.00)
VLDL: 37.6 mg/dL (ref 0.0–40.0)

## 2011-01-19 LAB — TSH: TSH: 0.73 u[IU]/mL (ref 0.35–5.50)

## 2011-01-21 ENCOUNTER — Ambulatory Visit (INDEPENDENT_AMBULATORY_CARE_PROVIDER_SITE_OTHER): Payer: Managed Care, Other (non HMO) | Admitting: Family Medicine

## 2011-01-21 ENCOUNTER — Encounter: Payer: Self-pay | Admitting: Family Medicine

## 2011-01-21 VITALS — BP 150/80 | HR 64 | Temp 97.9°F | Ht 65.0 in | Wt 202.8 lb

## 2011-01-21 DIAGNOSIS — E042 Nontoxic multinodular goiter: Secondary | ICD-10-CM | POA: Insufficient documentation

## 2011-01-21 DIAGNOSIS — N6019 Diffuse cystic mastopathy of unspecified breast: Secondary | ICD-10-CM

## 2011-01-21 DIAGNOSIS — K601 Chronic anal fissure: Secondary | ICD-10-CM

## 2011-01-21 DIAGNOSIS — E559 Vitamin D deficiency, unspecified: Secondary | ICD-10-CM

## 2011-01-21 DIAGNOSIS — I10 Essential (primary) hypertension: Secondary | ICD-10-CM

## 2011-01-21 DIAGNOSIS — E039 Hypothyroidism, unspecified: Secondary | ICD-10-CM

## 2011-01-21 DIAGNOSIS — K602 Anal fissure, unspecified: Secondary | ICD-10-CM

## 2011-01-21 DIAGNOSIS — E663 Overweight: Secondary | ICD-10-CM

## 2011-01-21 MED ORDER — LEVOTHYROXINE SODIUM 50 MCG PO TABS: 50.0000 ug | ORAL_TABLET | Freq: Every day | ORAL | Status: DC

## 2011-01-21 NOTE — Progress Notes (Signed)
  Subjective:    Patient ID: Alexandria Fry, female    DOB: May 19, 1955, 55 y.o.   MRN: [id number]  HPI Pt here for Comp Exam. She sees Dr Luella Cook for her Gyn care, last seen in Jun. Her last Mammo was 3/12, nml. She has no complaints but had Tdap in Nov and had signif swelling and then tenderness from that. This impeded her exercise. In Apr she had uterine bleeding. 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  Her nos today are acutely worse.     Review of Systems  Constitutional: Negative for fever, chills, diaphoresis, fatigue and unexpected weight change.  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 ). Negative for ear pain, rhinorrhea, trouble swallowing and tinnitus.   Eyes: Negative for pain, discharge and visual disturbance.  Respiratory: Negative for cough, shortness of breath and wheezing.   Cardiovascular: Negative for chest pain, palpitations and leg swelling.       No Fainting or Fatigue.  Gastrointestinal: Negative for nausea, vomiting, abdominal pain, diarrhea, constipation and blood in stool.       No Heartburn  Genitourinary: Negative for dysuria and frequency.  Musculoskeletal: Negative for myalgias, back pain and arthralgias.  Skin: Negative for rash.       No Itching or Dryness.  Neurological: Negative for tremors and numbness.       No Tingling. No Balance Problems.  Hematological: Negative for adenopathy. Does not bruise/bleed easily.  Psychiatric/Behavioral: Negative for dysphoric mood and agitation.       Objective:   Physical Exam  Constitutional: She is oriented to person, place, and time. She appears well-developed and well-nourished. No distress.  HENT:  Head: Normocephalic and atraumatic.  Left Ear: External ear normal.  Nose: Nose normal.  Mouth/Throat: Oropharynx is clear and moist. No oropharyngeal exudate.  Eyes: Conjunctivae and EOM are normal. Pupils are equal, round, and  reactive to light. No scleral icterus.  Neck: Normal range of motion. Neck supple. No thyromegaly present.  Cardiovascular: Normal rate, regular rhythm and normal heart sounds.  Exam reveals no friction rub.   No murmur heard. Pulmonary/Chest: Effort normal and breath sounds normal. No respiratory distress. She has no wheezes. She has no rales.  Abdominal: Soft. Bowel sounds are normal. She exhibits no mass. There is no tenderness.  Genitourinary:       Breast /pelvic not done.  Musculoskeletal: Normal range of motion. She exhibits no edema and no tenderness.  Lymphadenopathy:    She has no cervical adenopathy.  Neurological: She is alert and oriented to person, place, and time. She has normal reflexes.  Skin: Skin is warm and dry. No rash noted. She is not diaphoretic. No erythema.  Psychiatric: She has a normal mood and affect. Her behavior is normal. Judgment and thought content normal.          Assessment & Plan:  HMPE

## 2011-01-21 NOTE — Assessment & Plan Note (Signed)
Could still be better. Increase to 5 1000Iu daily.

## 2011-01-21 NOTE — Assessment & Plan Note (Signed)
Had been started, followed and titrated by endocrinology who has now turned her lose due to her stability. Will prescribe here and follow. Lab Results  Component Value Date   TSH 0.73 01/19/2011

## 2011-01-21 NOTE — Assessment & Plan Note (Signed)
Improved but had been better. Encouraged.

## 2011-01-21 NOTE — Patient Instructions (Addendum)
Refer to Dr Michela Pitcher for Surgery.

## 2011-01-21 NOTE — Assessment & Plan Note (Signed)
Mildly elevated. Will follow her weight. Encouraged to lose more.

## 2011-01-21 NOTE — Assessment & Plan Note (Signed)
Mammo to be scheduled.

## 2011-02-12 ENCOUNTER — Telehealth: Payer: Self-pay | Admitting: Internal Medicine

## 2011-02-12 MED ORDER — VITAMIN D 1000 UNITS PO CAPS: 1000.0000 [IU] | ORAL_CAPSULE | Freq: Three times a day (TID) | ORAL | Status: DC

## 2011-02-12 MED ORDER — LEVOTHYROXINE SODIUM 50 MCG PO TABS: 50.0000 ug | ORAL_TABLET | Freq: Every day | ORAL | Status: DC

## 2011-02-12 MED ORDER — METOPROLOL SUCCINATE ER 50 MG PO TB24: ORAL_TABLET | ORAL | Status: DC

## 2011-02-12 MED ORDER — FLUTICASONE PROPIONATE 50 MCG/ACT NA SUSP: 1.0000 | Freq: Every day | NASAL | Status: DC | PRN

## 2011-02-12 NOTE — Telephone Encounter (Signed)
Written.

## 2011-02-12 NOTE — Telephone Encounter (Signed)
Patient called and stated her new insurance starts in January and she was told to call and you would print out her meds for a 90 day supply and year's worth of refills until she can find someone in the network.

## 2011-02-12 NOTE — Telephone Encounter (Signed)
Patient advised by telephone that written scripts are up front and ready for pickup.

## 2011-02-20 ENCOUNTER — Telehealth: Payer: Self-pay | Admitting: *Deleted

## 2011-02-20 MED ORDER — METOPROLOL SUCCINATE ER 50 MG PO TB24: ORAL_TABLET | ORAL | Status: DC

## 2011-02-20 MED ORDER — LEVOTHYROXINE SODIUM 50 MCG PO TABS: 50.0000 ug | ORAL_TABLET | Freq: Every day | ORAL | Status: DC

## 2011-02-20 NOTE — Telephone Encounter (Signed)
Patient came to pick up Rx for mail order from Dr. Hetty Ely.  She says she takes the Toprol 50 mg 1 tablet twice daily.  Her Rx says 1/2 tab in the am and 1 tab in the pm.  She says her dose has fluctuated from time to time but Endo has told her to stick with 1 twice daily.  Also, she does not need the Vitamin D.  She and Dr. Kathie Rhodes talked about it and her level is not that low so she is taking OTC Vit D as replacement.  Old Rx in your in box.

## 2011-02-20 NOTE — Telephone Encounter (Signed)
New rx's printed, old rx's voided and shredded.  I took the rx for vit D off the list.  toprol dose adjust on med list.  Please give to patient.  Thanks.

## 2011-03-13 ENCOUNTER — Other Ambulatory Visit: Payer: Self-pay | Admitting: *Deleted

## 2011-03-13 MED ORDER — METOPROLOL SUCCINATE ER 50 MG PO TB24: ORAL_TABLET | ORAL | Status: DC

## 2011-05-19 ENCOUNTER — Ambulatory Visit: Payer: Self-pay | Admitting: Family Medicine

## 2011-05-19 ENCOUNTER — Encounter: Payer: Self-pay | Admitting: Family Medicine

## 2011-05-21 ENCOUNTER — Encounter: Payer: Self-pay | Admitting: *Deleted

## 2011-06-16 ENCOUNTER — Encounter: Payer: Self-pay | Admitting: Family Medicine

## 2011-12-21 ENCOUNTER — Ambulatory Visit (INDEPENDENT_AMBULATORY_CARE_PROVIDER_SITE_OTHER): Payer: Managed Care, Other (non HMO) | Admitting: Family Medicine

## 2011-12-21 ENCOUNTER — Encounter: Payer: Self-pay | Admitting: Family Medicine

## 2011-12-21 VITALS — BP 150/84 | HR 72 | Temp 99.0°F | Wt 221.8 lb

## 2011-12-21 DIAGNOSIS — J069 Acute upper respiratory infection, unspecified: Secondary | ICD-10-CM

## 2011-12-21 MED ORDER — BENZONATATE 200 MG PO CAPS: 200.0000 mg | ORAL_CAPSULE | Freq: Three times a day (TID) | ORAL | Status: AC | PRN

## 2011-12-21 NOTE — Progress Notes (Signed)
2 weeks ago got a flu shot.  Over the last week, had diarrhea, ST, voice changes, clear rhinorrhea and congestion.  No fevers.  No vomiting. Eyes are injected, but this is similar to baseline.  Had restarted flonase in last week, w/o any improvement yet.  Barky cough but no sputum.  Non smoker.    Works at [employer].    Meds, vitals, and allergies reviewed.   ROS: See HPI.  Otherwise, noncontributory.  GEN: nad, alert and oriented HEENT: mucous membranes moist, tm w/o erythema, nasal exam w/o erythema, clear discharge noted,  OP with cobblestoning, conjunctiva injected B NECK: supple w/o LA CV: rrr.   Murmur noted PULM: ctab, no inc wob EXT: no edema SKIN: no acute rash

## 2011-12-21 NOTE — Patient Instructions (Signed)
Drink plenty of fluids, use tessalon for the cough, and gargle with warm salt water for your throat.  This should gradually improve.  Take care.  Let us know if you have other concerns.

## 2011-12-22 ENCOUNTER — Encounter: Payer: Self-pay | Admitting: Family Medicine

## 2011-12-22 DIAGNOSIS — J069 Acute upper respiratory infection, unspecified: Secondary | ICD-10-CM | POA: Insufficient documentation

## 2011-12-22 NOTE — Assessment & Plan Note (Signed)
Likely viral, nontoxic.  Tessalon for cough and f/u prn.  Supportive tx o/w.  She agrees.

## 2012-01-04 ENCOUNTER — Encounter: Payer: Self-pay | Admitting: *Deleted

## 2012-01-19 ENCOUNTER — Other Ambulatory Visit: Payer: Self-pay | Admitting: Family Medicine

## 2012-01-19 DIAGNOSIS — I10 Essential (primary) hypertension: Secondary | ICD-10-CM

## 2012-01-19 DIAGNOSIS — E039 Hypothyroidism, unspecified: Secondary | ICD-10-CM

## 2012-01-19 DIAGNOSIS — E559 Vitamin D deficiency, unspecified: Secondary | ICD-10-CM

## 2012-01-22 ENCOUNTER — Other Ambulatory Visit (INDEPENDENT_AMBULATORY_CARE_PROVIDER_SITE_OTHER): Payer: Managed Care, Other (non HMO)

## 2012-01-22 DIAGNOSIS — E559 Vitamin D deficiency, unspecified: Secondary | ICD-10-CM

## 2012-01-22 DIAGNOSIS — E039 Hypothyroidism, unspecified: Secondary | ICD-10-CM

## 2012-01-22 DIAGNOSIS — I10 Essential (primary) hypertension: Secondary | ICD-10-CM

## 2012-01-22 LAB — COMPREHENSIVE METABOLIC PANEL
ALT: 35 U/L (ref 0–35)
BUN: 14 mg/dL (ref 6–23)
Calcium: 9 mg/dL (ref 8.4–10.5)
GFR: 75.66 mL/min (ref >60.00)
Potassium: 4.1 mEq/L (ref 3.5–5.1)
Total Bilirubin: 0.8 mg/dL (ref 0.3–1.2)
Total Protein: 7.4 g/dL (ref 6.0–8.3)

## 2012-01-22 LAB — LIPID PANEL
Cholesterol: 204 mg/dL — ABNORMAL HIGH (ref 0–200)
VLDL: 60 mg/dL — ABNORMAL HIGH (ref 0.0–40.0)

## 2012-01-22 LAB — LDL CHOLESTEROL, DIRECT: Direct LDL: 117.5 mg/dL

## 2012-01-22 LAB — TSH: TSH: 1.16 u[IU]/mL (ref 0.35–5.50)

## 2012-01-29 ENCOUNTER — Encounter: Payer: Self-pay | Admitting: Family Medicine

## 2012-01-29 ENCOUNTER — Ambulatory Visit (INDEPENDENT_AMBULATORY_CARE_PROVIDER_SITE_OTHER): Payer: Managed Care, Other (non HMO) | Admitting: Family Medicine

## 2012-01-29 VITALS — BP 152/88 | HR 67 | Temp 99.1°F | Ht 65.0 in | Wt 221.0 lb

## 2012-01-29 DIAGNOSIS — E663 Overweight: Secondary | ICD-10-CM

## 2012-01-29 DIAGNOSIS — E039 Hypothyroidism, unspecified: Secondary | ICD-10-CM

## 2012-01-29 DIAGNOSIS — I10 Essential (primary) hypertension: Secondary | ICD-10-CM

## 2012-01-29 DIAGNOSIS — Z0000 Encounter for general adult medical examination without abnormal findings: Secondary | ICD-10-CM

## 2012-01-29 MED ORDER — FLUTICASONE PROPIONATE 50 MCG/ACT NA SUSP: 1.0000 | Freq: Every day | NASAL | Status: DC | PRN

## 2012-01-29 MED ORDER — LEVOTHYROXINE SODIUM 50 MCG PO TABS: 50.0000 ug | ORAL_TABLET | Freq: Every day | ORAL | Status: DC

## 2012-01-29 MED ORDER — METOPROLOL SUCCINATE ER 50 MG PO TB24: ORAL_TABLET | ORAL | Status: DC

## 2012-01-29 NOTE — Progress Notes (Signed)
CPE- See plan.  Routine anticipatory guidance given to patient.  See health maintenance. Tetanus 2011 Flu shot 2013 Colonoscopy 2011 Mammogram up to date Breast/pap/pelvic via GYN clinic.  2-3 glasses of wine a day, is cutting back.  Labs d/w pt.  Living will d/w pt.  Husband designated if incapacitated.  Diet and exercise discussed.   Hypothyroid.  TSH wnl.  Compliant with meds.  No dysphagia.    Recently had neg endometrial biopsy per gyn clinic.  Has very irregular menses.  Per pt, labs indicated that she hadn't gone through menopause yet.   Hypertension:    Using medication without problems or lightheadedness: yes Chest pain with exertion:no Edema:no Short of breath:no  PMH and SH reviewed  Meds, vitals, and allergies reviewed.   ROS: See HPI.  Otherwise negative.    GEN: nad, alert and oriented HEENT: mucous membranes moist NECK: supple w/o LA, no tmg CV: rrr. PULM: ctab, no inc wob ABD: soft, +bs EXT: no edema SKIN: no acute rash

## 2012-01-29 NOTE — Patient Instructions (Addendum)
I would try biotin for your nails.  Try to work on your weight with diet and exercise.   Take care.

## 2012-01-31 ENCOUNTER — Encounter: Payer: Self-pay | Admitting: Family Medicine

## 2012-01-31 DIAGNOSIS — Z0000 Encounter for general adult medical examination without abnormal findings: Secondary | ICD-10-CM | POA: Insufficient documentation

## 2012-01-31 NOTE — Assessment & Plan Note (Signed)
Continue to work on diet and weight, continue meds.  She'll follow up on BP out of clinic.  If persistently elevated then notify clinic.  Recheck 160/80.

## 2012-01-31 NOTE — Assessment & Plan Note (Signed)
Routine anticipatory guidance given to patient.  See health maintenance. Tetanus 2011 Flu shot 2013 Colonoscopy 2011 Mammogram up to date Breast/pap/pelvic via GYN clinic.  2-3 glasses of wine a day, is cutting back.  Labs d/w pt.  Living will d/w pt.  Husband designated if incapacitated.  Diet and exercise discussed.

## 2012-01-31 NOTE — Assessment & Plan Note (Signed)
Controlled, continue replacement and recheck yearly.

## 2012-01-31 NOTE — Assessment & Plan Note (Signed)
D/w pt about diet and exercise.  She is cutting back on etoh.

## 2012-02-17 DIAGNOSIS — J189 Pneumonia, unspecified organism: Secondary | ICD-10-CM

## 2012-02-17 HISTORY — DX: Pneumonia, unspecified organism: J18.9

## 2012-08-01 ENCOUNTER — Emergency Department: Payer: Self-pay | Admitting: Emergency Medicine

## 2012-08-04 ENCOUNTER — Ambulatory Visit (INDEPENDENT_AMBULATORY_CARE_PROVIDER_SITE_OTHER): Payer: Managed Care, Other (non HMO) | Admitting: Family Medicine

## 2012-08-04 ENCOUNTER — Encounter: Payer: Self-pay | Admitting: Family Medicine

## 2012-08-04 VITALS — BP 142/86 | HR 72 | Temp 98.0°F | Wt 223.8 lb

## 2012-08-04 DIAGNOSIS — E039 Hypothyroidism, unspecified: Secondary | ICD-10-CM

## 2012-08-04 DIAGNOSIS — R040 Epistaxis: Secondary | ICD-10-CM

## 2012-08-04 DIAGNOSIS — I10 Essential (primary) hypertension: Secondary | ICD-10-CM

## 2012-08-04 LAB — BASIC METABOLIC PANEL
BUN: 13 mg/dL (ref 6–23)
CO2: 30 mEq/L (ref 19–32)
Chloride: 102 mEq/L (ref 96–112)
Glucose, Bld: 107 mg/dL — ABNORMAL HIGH (ref 70–99)
Potassium: 4.1 mEq/L (ref 3.5–5.1)
Sodium: 137 mEq/L (ref 135–145)

## 2012-08-04 NOTE — Progress Notes (Signed)
Seen at [facility] ER (08/01/12) initially, then [facility] (6/17 and 6/18).  Packed yesterday at ENT.  Will be removed on Monday at ENT.   BP was high at the ER, but she admits to being panicked. She had been eating more salty food. She may have missed some doses of her BP med. Her ankles were puffy, but resolved in meantime. No CP, SOB.  She admits social stressors may play a role in her BP elevation.    She wanted to f/u today about her BP.    Meds, vitals, and allergies reviewed.   ROS: See HPI.  Otherwise, noncontributory.  nad L nostril packed.   R nostril wnl rrr ctab abd soft Ext w/o edema

## 2012-08-04 NOTE — Patient Instructions (Addendum)
Go to the lab on the way out.  We'll contact you with your lab report. Take care.  Don't change your meds for now.   

## 2012-08-05 DIAGNOSIS — R040 Epistaxis: Secondary | ICD-10-CM | POA: Insufficient documentation

## 2012-08-05 LAB — CBC WITH DIFFERENTIAL/PLATELET
Basophils Relative: 0.1 % (ref 0.0–3.0)
Eosinophils Relative: 0.9 % (ref 0.0–5.0)
Lymphocytes Relative: 11.1 % — ABNORMAL LOW (ref 12.0–46.0)
MCV: 100.6 fl — ABNORMAL HIGH (ref 78.0–100.0)
Monocytes Relative: 5.9 % (ref 3.0–12.0)
Neutrophils Relative %: 82 % — ABNORMAL HIGH (ref 43.0–77.0)
Platelets: 209 10*3/uL (ref 150.0–400.0)
RBC: 4.71 Mil/uL (ref 3.87–5.11)
WBC: 10 10*3/uL (ref 4.5–10.5)

## 2012-08-05 NOTE — Assessment & Plan Note (Signed)
Improved today.  She had missed some doses of med.  Social factors and diet may have contributed to recent elevation.  Continue current meds, check basic labs today, f/u with ENT next week.  She agrees.

## 2012-08-05 NOTE — Assessment & Plan Note (Signed)
Per ENT

## 2012-09-27 ENCOUNTER — Ambulatory Visit (INDEPENDENT_AMBULATORY_CARE_PROVIDER_SITE_OTHER): Payer: Managed Care, Other (non HMO) | Admitting: Family Medicine

## 2012-09-27 ENCOUNTER — Encounter: Payer: Self-pay | Admitting: Family Medicine

## 2012-09-27 VITALS — BP 148/80 | HR 84 | Temp 100.0°F | Wt 223.0 lb

## 2012-09-27 DIAGNOSIS — R509 Fever, unspecified: Secondary | ICD-10-CM

## 2012-09-27 NOTE — Progress Notes (Signed)
Dec in appetite, diffuse aches, dry cough.  Sinus pressure but not rhinorrhea.  L mastoid pain prev, resolved not ttp now.  HA with cough. Mild scratchy throat, better now.  Fatigued, sleeping more.  Fevers noted.  Sweaty.  Taking advil.  She had possible brief vision change today in the lower fields, resolved, unclear if both eyes.  No other ocular sx.  No vision loss.  Meds, vitals, and allergies reviewed.   ROS: See HPI.  Otherwise, noncontributory.  GEN: Sweaty but NAD, alert and oriented HEENT: mucous membranes moist, tm w/o erythema, nasal exam w/o erythema, clear discharge noted,  OP with cobblestoning NECK: supple w/o LA, goiter noted CV: rrr.   PULM: ctab, no inc wob EXT: no edema SKIN: no acute rash CN 2-12 wnl B, S/S wnl with equal DTRs Fundus wnl, PERRL

## 2012-09-27 NOTE — Patient Instructions (Addendum)
Drink plenty of fluids, take ibuprofen as needed, and gargle with warm salt water for your throat if needed.  This should gradually improve.  Take care.  Let us know if you have other concerns.

## 2012-09-28 NOTE — Assessment & Plan Note (Signed)
With fever but nontoxic and okay for outpatient f/u.  Likely viral with diffuse sx.  She agrees with plan for supportive tx and f/u prn.  See instructions.

## 2012-09-30 ENCOUNTER — Inpatient Hospital Stay: Payer: Self-pay | Admitting: Internal Medicine

## 2012-09-30 ENCOUNTER — Encounter: Payer: Self-pay | Admitting: Family Medicine

## 2012-09-30 ENCOUNTER — Telehealth: Payer: Self-pay

## 2012-09-30 ENCOUNTER — Ambulatory Visit (INDEPENDENT_AMBULATORY_CARE_PROVIDER_SITE_OTHER): Payer: Managed Care, Other (non HMO) | Admitting: Family Medicine

## 2012-09-30 ENCOUNTER — Ambulatory Visit (INDEPENDENT_AMBULATORY_CARE_PROVIDER_SITE_OTHER)
Admission: RE | Admit: 2012-09-30 | Discharge: 2012-09-30 | Disposition: A | Discharge status: Home / Self Care | Payer: Managed Care, Other (non HMO) | Source: Ambulatory Visit | Attending: Family Medicine | Admitting: Family Medicine

## 2012-09-30 VITALS — BP 146/76 | HR 96 | Temp 99.5°F | Wt 226.0 lb

## 2012-09-30 DIAGNOSIS — R05 Cough: Secondary | ICD-10-CM

## 2012-09-30 DIAGNOSIS — J189 Pneumonia, unspecified organism: Secondary | ICD-10-CM | POA: Insufficient documentation

## 2012-09-30 LAB — COMPREHENSIVE METABOLIC PANEL
Albumin: 2.7 g/dL — ABNORMAL LOW (ref 3.4–5.0)
Alkaline Phosphatase: 133 U/L (ref 50–136)
Anion Gap: 10 (ref 7–16)
Calcium, Total: 9.2 mg/dL (ref 8.5–10.1)
Chloride: 104 mmol/L (ref 98–107)
Co2: 23 mmol/L (ref 21–32)
EGFR (Non-African Amer.): >60
Glucose: 93 mg/dL (ref 65–99)
Osmolality: 272 (ref 275–301)
Potassium: 3.7 mmol/L (ref 3.5–5.1)
SGOT(AST): 52 U/L — ABNORMAL HIGH (ref 15–37)
SGPT (ALT): 50 U/L (ref 12–78)
Sodium: 137 mmol/L (ref 136–145)
Total Protein: 8.2 g/dL (ref 6.4–8.2)

## 2012-09-30 LAB — CBC
HCT: 44.3 % (ref 35.0–47.0)
HGB: 15 g/dL (ref 12.0–16.0)
MCH: 33.4 pg (ref 26.0–34.0)
MCHC: 33.8 g/dL (ref 32.0–36.0)
Platelet: 275 10*3/uL (ref 150–440)
WBC: 21.6 10*3/uL — ABNORMAL HIGH (ref 3.6–11.0)

## 2012-09-30 LAB — TROPONIN I: Troponin-I: <0.02 ng/mL

## 2012-09-30 NOTE — Telephone Encounter (Signed)
Pt seen 09/27/12; still has fever (no thermometer) and non prod cough; wheezing and SOB worse; pt said can wait for appt this afternoon; pt scheduled at 09/30/12 at 2:30 pm. Pt advised if condition worsens or changes prior to appt pt will call back.

## 2012-09-30 NOTE — Progress Notes (Signed)
Last OV note reviewed.  In the interval, no CP.  She is more sob and coughing more.  Still with fevers and sweats.  No ear pain.  Voice is hoarse.  No ST.    Meds, vitals, and allergies reviewed.   ROS: See HPI.  Otherwise, noncontributory.  nad but sweaty Mmm rrr No focal dec in BS No wheeze abd soft Ext w/o edema  Pulse ox 87-91% on RA here in clinic.   CXR with [facility].

## 2012-09-30 NOTE — Patient Instructions (Signed)
To ER

## 2012-09-30 NOTE — Assessment & Plan Note (Signed)
D/w pt.  Will send to [facility] ER for likely inpatient tx as pulse ox is not high, she is more SOB over the last few days and isn't improving.  Would be reasonable for likely levaquin tx, but will defer to ER/inpatient MD.  She agrees with ER eval.

## 2012-10-01 LAB — CBC WITH DIFFERENTIAL/PLATELET
Bands: 10 %
Comment - H1-Com3: NORMAL
HCT: 40.7 % (ref 35.0–47.0)
HGB: 13.7 g/dL (ref 12.0–16.0)
Lymphocytes: 5 %
MCV: 98 fL (ref 80–100)
Monocytes: 1 %
RDW: 12.7 % (ref 11.5–14.5)
Segmented Neutrophils: 84 %
WBC: 19.3 10*3/uL — ABNORMAL HIGH (ref 3.6–11.0)

## 2012-10-01 LAB — BASIC METABOLIC PANEL
Anion Gap: 5 — ABNORMAL LOW (ref 7–16)
BUN: 8 mg/dL (ref 7–18)
Co2: 29 mmol/L (ref 21–32)
Creatinine: 0.83 mg/dL (ref 0.60–1.30)
EGFR (African American): >60
Osmolality: 272 (ref 275–301)
Sodium: 135 mmol/L — ABNORMAL LOW (ref 136–145)

## 2012-10-02 ENCOUNTER — Telehealth: Payer: Self-pay | Admitting: Family Medicine

## 2012-10-02 LAB — CBC WITH DIFFERENTIAL/PLATELET
Basophil #: 0.1 10*3/uL (ref 0.0–0.1)
Basophil %: 0.3 %
Eosinophil #: 0.1 10*3/uL (ref 0.0–0.7)
Eosinophil %: 0.7 %
HGB: 13.3 g/dL (ref 12.0–16.0)
MCHC: 33.2 g/dL (ref 32.0–36.0)
Monocyte %: 6.7 %
Neutrophil #: 14.6 10*3/uL — ABNORMAL HIGH (ref 1.4–6.5)
Neutrophil %: 84.3 %
RBC: 4.06 10*6/uL (ref 3.80–5.20)
RDW: 13.1 % (ref 11.5–14.5)
WBC: 17.3 10*3/uL — ABNORMAL HIGH (ref 3.6–11.0)

## 2012-10-02 NOTE — Telephone Encounter (Signed)
Sent to ER with [facility] Friday.  Please get update on patient.  Thanks.

## 2012-10-03 LAB — VANCOMYCIN, TROUGH: Vancomycin, Trough: 10 ug/mL (ref 10–20)

## 2012-10-03 NOTE — Telephone Encounter (Signed)
Noted, thanks, I'll await hospital records.

## 2012-10-03 NOTE — Telephone Encounter (Signed)
Left message on VM.

## 2012-10-03 NOTE — Telephone Encounter (Signed)
Pt admitted [facility] rm 235; on multiple IV antibiotics; pt feels terrible having a lot of nausea.Pt wanted Dr Para March to know even though took xray with her pt had to repeat xray at hospital. Pt not sure when will be able to go home. Pt did have severe itching and flushing reaction to Levaquin while in hospital. Added to allergy list.

## 2012-10-04 LAB — CBC WITH DIFFERENTIAL/PLATELET
Basophil #: 0 10*3/uL (ref 0.0–0.1)
Basophil %: 0.3 %
HGB: 13.6 g/dL (ref 12.0–16.0)
Lymphocyte #: 0.9 10*3/uL — ABNORMAL LOW (ref 1.0–3.6)
Lymphocyte %: 8.6 %
MCH: 34 pg (ref 26.0–34.0)
MCHC: 34.9 g/dL (ref 32.0–36.0)
MCV: 98 fL (ref 80–100)
Neutrophil #: 8.5 10*3/uL — ABNORMAL HIGH (ref 1.4–6.5)
Neutrophil %: 79.8 %
RBC: 4.01 10*6/uL (ref 3.80–5.20)
RDW: 13.3 % (ref 11.5–14.5)
WBC: 10.6 10*3/uL (ref 3.6–11.0)

## 2012-10-04 LAB — CREATININE, SERUM
Creatinine: 0.83 mg/dL (ref 0.60–1.30)
EGFR (Non-African Amer.): >60

## 2012-10-06 DIAGNOSIS — I319 Disease of pericardium, unspecified: Secondary | ICD-10-CM

## 2012-10-06 LAB — CBC WITH DIFFERENTIAL/PLATELET
Lymphocytes: 15 %
MCHC: 33.9 g/dL (ref 32.0–36.0)
MCV: 98 fL (ref 80–100)
Metamyelocyte: 1 %
Monocytes: 7 %
Platelet: 277 10*3/uL (ref 150–440)
RDW: 13.1 % (ref 11.5–14.5)
Variant Lymphocyte - H1-Rlymph: 1 %
WBC: 9.5 10*3/uL (ref 3.6–11.0)

## 2012-10-06 LAB — BASIC METABOLIC PANEL
Chloride: 101 mmol/L (ref 98–107)
Co2: 34 mmol/L — ABNORMAL HIGH (ref 21–32)
Creatinine: 0.75 mg/dL (ref 0.60–1.30)
EGFR (African American): >60
EGFR (Non-African Amer.): >60
Glucose: 102 mg/dL — ABNORMAL HIGH (ref 65–99)
Osmolality: 274 (ref 275–301)
Sodium: 138 mmol/L (ref 136–145)

## 2012-10-07 ENCOUNTER — Telehealth: Payer: Self-pay | Admitting: Family Medicine

## 2012-10-07 LAB — VANCOMYCIN, TROUGH: Vancomycin, Trough: 14 ug/mL (ref 10–20)

## 2012-10-07 NOTE — Telephone Encounter (Signed)
I called pt.  Still admitted, on O2, has IV abx and possible broch planned per patient report.  She feels some better.  I app help of all involved.  Will await inpatient notes.  She thanked me for the call.

## 2012-10-10 LAB — VANCOMYCIN, TROUGH: Vancomycin, Trough: 20 ug/mL (ref 10–20)

## 2012-10-10 LAB — CREATININE, SERUM
Creatinine: 0.72 mg/dL (ref 0.60–1.30)
EGFR (African American): >60

## 2012-10-10 LAB — PLATELET COUNT: Platelet: 343 10*3/uL (ref 150–440)

## 2012-10-19 ENCOUNTER — Ambulatory Visit (INDEPENDENT_AMBULATORY_CARE_PROVIDER_SITE_OTHER): Payer: Managed Care, Other (non HMO) | Admitting: Family Medicine

## 2012-10-19 ENCOUNTER — Encounter: Payer: Self-pay | Admitting: Family Medicine

## 2012-10-19 VITALS — BP 128/78 | HR 72 | Temp 98.6°F | Wt 218.0 lb

## 2012-10-19 DIAGNOSIS — Z23 Encounter for immunization: Secondary | ICD-10-CM

## 2012-10-19 DIAGNOSIS — J189 Pneumonia, unspecified organism: Secondary | ICD-10-CM

## 2012-10-19 NOTE — Patient Instructions (Addendum)
I'll work on Environmental manager.   Wear the oxygen when walking.  Gradually try to increase your distances but don't walk in the heat.   When you can walk more easily then try walking off oxygen and then try the stairs.  Give me an update in about 1 week.  Take care.

## 2012-10-19 NOTE — Assessment & Plan Note (Addendum)
Done with abx, no fevrs.  desats to 86% here walking on RA.  Still needs to be on O2 for walking, sleeping.   Continue O2 for now.  Gradually inc exercise.  When walking distance required for work, can return to work.  Out of work in meantime.   She agrees.  Recheck CXR in about 4 weeks.  Reviewing all available hospital records.   >25 min spent with face to face with patient, >50% counseling and/or coordinating care.  She has been out of work from 09/27/12 to now with this illness.  She will likely be out for 1-2 more weeks.  She'll update me on her progress next week.

## 2012-10-19 NOTE — Progress Notes (Signed)
Admitted for PNA.  Initial OV 8/12. Recheck 8/15.  Admitted 8/15-->8/25.    Inpatient course- Echo done.  Not intubated.  Mult abx, completed as inpatient.  Discharged on O2.    Still on O2 at night and if up and active. Not used if sitting.  She is less SOB than prev. She can occ get up 1 flight of stairs if going slowly but this is difficult for her.  Off O2 at OV today. She'll need to be able to walk up stairs or walk sig distance to get to/from work.   She feels better from admission and at discharge.  Still with come cough.  No fevers.   PMH and SH reviewed  ROS: See HPI, otherwise noncontributory.  Meds, vitals, and allergies reviewed.   nad ncat Mmm Murmur noted rrr ctab abd soft, not ttp Ext w/o cyanosis.   desats to 86% here walking on RA.

## 2012-10-26 ENCOUNTER — Telehealth: Payer: Self-pay

## 2012-10-26 NOTE — Telephone Encounter (Signed)
Patient notified as instructed by telephone. Patient states that she is to return to work Monday 11-07-12 and does not feel that she is going to be able to go to work with her oxygen. Patient states that she has her pulmonary testing scheduled for 11/16/12. Patient will get the paperwork needed from her company to be completed to extend her work release and will have it delivered to the office to be completed.

## 2012-10-26 NOTE — Telephone Encounter (Signed)
Note, thanks.

## 2012-10-26 NOTE — Telephone Encounter (Signed)
I think the pulmonary recs are reasonable, for the additional testing and the O2 at 3L.   I would continue with that plan and I can update her work release as needed.  I'll await the pulmonary testing.   I would like another update from patient in about 2 weeks, sooner if needed.  My expectation is that she will still make progress.  Thanks for the update.

## 2012-10-26 NOTE — Telephone Encounter (Signed)
Pt was seen 10/19/12 and pt feels basically the same as when seen on 10/19/12. Pt saw pulmonologist today; pt to have another echocardiogram, have a sleep study and pulmonary function test. Pt to have f/u appt with pulmonologist in mid October. Pt said walking is not going any better. Today at appt without Oxygen after 30 seconds pulse ox 86 %, O2 at 2 L after walking 2 mins pulse ox was 86%. Pt was advised when walking to have O2 at 3 L. Please advise. Pt request cb with Dr Lianne Bushy thoughts.

## 2012-11-02 ENCOUNTER — Ambulatory Visit (INDEPENDENT_AMBULATORY_CARE_PROVIDER_SITE_OTHER): Payer: Managed Care, Other (non HMO) | Admitting: Family Medicine

## 2012-11-02 ENCOUNTER — Encounter: Payer: Self-pay | Admitting: Family Medicine

## 2012-11-02 VITALS — BP 140/80 | HR 64 | Temp 98.2°F | Wt 220.2 lb

## 2012-11-02 DIAGNOSIS — J189 Pneumonia, unspecified organism: Secondary | ICD-10-CM

## 2012-11-02 NOTE — Assessment & Plan Note (Signed)
She could walk farther today than at last check but still dropped to 86% RA.  She feels some better, not worse overall, but clearly not back to normal.  Would hold her out of work until exercise tolerance is improved and not desating.  She agrees.  Nontoxic. Call back with update.

## 2012-11-02 NOTE — Patient Instructions (Addendum)
Out of work from today 11/13/12.   Gradually try to increase your walking distance and time.   If you need another extension notify me at the end of next week.  Take care.

## 2012-11-02 NOTE — Progress Notes (Signed)
Was seen at pulmonary but even with supplemental O2 she had dec in exercise tolerance and would still desat.  She got SOB walking into the clinic today from the waiting room to the exam room, ie short distance. She feels some better, not worse overall, but clearly not back to normal. She uses O2 with the treadmill at home and she is gradually trying to increase her time with that.  She has tried going up stairs. Can do 2 flights very slowly, but not 3.  She needs to be able to do 3 to go back to work.  No sputum, very minimal cough.  No CP.  No fevers.  She has f/u with pulmonary pending.  She continues with IS at home.    She could walk farther today than at last check but still dropped to 86% RA.   Meds, vitals, and allergies reviewed.   ROS: See HPI.  Otherwise, noncontributory.  nad ncat mmm ctab  rrr Ext well perfused.  Trace BLE edema.

## 2012-11-08 ENCOUNTER — Telehealth: Payer: Self-pay

## 2012-11-08 NOTE — Telephone Encounter (Signed)
I wrote the letter - please print and I will sign it, thanks

## 2012-11-08 NOTE — Telephone Encounter (Signed)
Pt said she just needs a letter saying that we are extending her short term disability and then needs it faxed to [facility] (fax # in our system under media tab)

## 2012-11-08 NOTE — Telephone Encounter (Signed)
That is fine - do I need to fill out a form ? - not familiar since not my patient

## 2012-11-08 NOTE — Telephone Encounter (Signed)
Pt left v/m requesting an extension of short term disability for one more week to allow pt time to wien off oxygen.pt request cb.

## 2012-11-08 NOTE — Telephone Encounter (Signed)
Letter faxed and pt notified.

## 2012-11-14 ENCOUNTER — Encounter: Payer: Self-pay | Admitting: Family Medicine

## 2012-11-14 ENCOUNTER — Ambulatory Visit: Payer: Self-pay | Admitting: Family Medicine

## 2012-11-15 ENCOUNTER — Ambulatory Visit (INDEPENDENT_AMBULATORY_CARE_PROVIDER_SITE_OTHER)
Admission: RE | Admit: 2012-11-15 | Discharge: 2012-11-15 | Disposition: A | Discharge status: Home / Self Care | Payer: Managed Care, Other (non HMO) | Source: Ambulatory Visit | Attending: Family Medicine | Admitting: Family Medicine

## 2012-11-15 ENCOUNTER — Encounter: Payer: Self-pay | Admitting: Family Medicine

## 2012-11-15 ENCOUNTER — Ambulatory Visit (INDEPENDENT_AMBULATORY_CARE_PROVIDER_SITE_OTHER): Payer: Managed Care, Other (non HMO) | Admitting: Family Medicine

## 2012-11-15 VITALS — BP 114/60 | HR 60 | Temp 98.8°F | Wt 219.5 lb

## 2012-11-15 DIAGNOSIS — M25539 Pain in unspecified wrist: Secondary | ICD-10-CM | POA: Insufficient documentation

## 2012-11-15 DIAGNOSIS — J189 Pneumonia, unspecified organism: Secondary | ICD-10-CM

## 2012-11-15 DIAGNOSIS — M25532 Pain in left wrist: Secondary | ICD-10-CM

## 2012-11-15 NOTE — Patient Instructions (Addendum)
Go to the lab on the way out.  We'll contact you with your xray report. Use a wrist brace on your left wrist.  Ice it and take ibuprofen with food if needed.   I would get a flu shot each fall.

## 2012-11-15 NOTE — Assessment & Plan Note (Signed)
See cxr report.  Clinically improved.  Able to walk at faster pace in office with O2 88-90%, returns to 96% at rest.  Less SOB now with exertion.  Return to work 11/21/12.  Form signed for patient. She'll f/u with pulm.

## 2012-11-15 NOTE — Progress Notes (Signed)
Exercise tolerance has increased, more recently.  Off O2 now.  Was able to get to grocery store w/o O2.  Able to walk 10 min on treadmill.  This is improved.  She still gets some fatigue.  Not coughing.  No sputum.  No fevers.  She feels better overall.  Has fu with pulmonary pending for tomorrow.   L wrist pain after recent overuse, opening a jar, lifting a light weight. No trauma.  Prev puffy, better after icing.    Meds, vitals, and allergies reviewed.   ROS: See HPI.  Otherwise, noncontributory.  nad ncat Mmm rrr ctab abd soft, not ttp Ext w/o edema No cyanosis  L wrist minimally puffy along the distal ulna with pain on radial/ulnar deviation but not point tender at a bony prominence  Distally nv intact

## 2012-11-15 NOTE — Assessment & Plan Note (Signed)
Likely strain, tendonitis.  Would use a brace and ice, f/u prn.  She agrees.  Gi caution on nsaids.

## 2012-11-17 ENCOUNTER — Encounter: Payer: Self-pay | Admitting: Family Medicine

## 2012-12-08 ENCOUNTER — Ambulatory Visit (INDEPENDENT_AMBULATORY_CARE_PROVIDER_SITE_OTHER): Payer: Managed Care, Other (non HMO) | Admitting: Family Medicine

## 2012-12-08 ENCOUNTER — Encounter: Payer: Self-pay | Admitting: Family Medicine

## 2012-12-08 VITALS — BP 142/84 | HR 84 | Temp 98.4°F | Wt 221.0 lb

## 2012-12-08 DIAGNOSIS — R05 Cough: Secondary | ICD-10-CM

## 2012-12-08 NOTE — Patient Instructions (Signed)
I think this is either allergies or a cold.  Work from home today, update me tomorrow.  Get some rest and drink plenty of fluids.  Take care.

## 2012-12-08 NOTE — Assessment & Plan Note (Signed)
Nontoxic, doesn't resemble the prev illness.  Likely is either environmental allergies or a virus.  Benign exam.  Out of work today, rest, she'll notify if worsening.  She agrees.

## 2012-12-08 NOTE — Progress Notes (Signed)
Prev with PNA hx noted. Was doing better until this AM.  She didn't know if she had recent sx due to allergies.  Possible sick contacts at a restaurant.  No fevers. R ear feels plugged, R nasal congestion. Ears popping. Some cough this AM, dry.  Used afrin and that helped with nasal congestion.  ST, mild, irritated.  She doesn't feel poorly but is a little fatigued.    Meds, vitals, and allergies reviewed.   ROS: See HPI.  Otherwise, noncontributory.  GEN: nad, alert and oriented HEENT: mucous membranes moist, tm w/o erythema, nasal exam w/o erythema, clear discharge noted,  OP with cobblestoning, sinuses not ttp NECK: supple w/o LA CV: rrr.   PULM: completely ctab, no inc wob, no wheeze EXT: no edema SKIN: no acute rash

## 2012-12-09 ENCOUNTER — Encounter: Payer: Self-pay | Admitting: Family Medicine

## 2012-12-16 ENCOUNTER — Ambulatory Visit (INDEPENDENT_AMBULATORY_CARE_PROVIDER_SITE_OTHER): Payer: Managed Care, Other (non HMO)

## 2012-12-16 DIAGNOSIS — Z23 Encounter for immunization: Secondary | ICD-10-CM

## 2013-01-30 ENCOUNTER — Other Ambulatory Visit: Payer: Self-pay | Admitting: Family Medicine

## 2013-01-30 MED ORDER — LEVOTHYROXINE SODIUM 50 MCG PO TABS: 50.0000 ug | ORAL_TABLET | Freq: Every day | ORAL | Status: DC

## 2013-01-30 MED ORDER — METOPROLOL SUCCINATE ER 50 MG PO TB24: ORAL_TABLET | ORAL | Status: DC

## 2013-05-11 ENCOUNTER — Encounter: Payer: Self-pay | Admitting: Family Medicine

## 2013-05-11 ENCOUNTER — Ambulatory Visit (INDEPENDENT_AMBULATORY_CARE_PROVIDER_SITE_OTHER)
Admission: RE | Admit: 2013-05-11 | Discharge: 2013-05-11 | Disposition: A | Discharge status: Home / Self Care | Payer: Managed Care, Other (non HMO) | Source: Ambulatory Visit | Attending: Family Medicine | Admitting: Family Medicine

## 2013-05-11 ENCOUNTER — Ambulatory Visit (INDEPENDENT_AMBULATORY_CARE_PROVIDER_SITE_OTHER): Payer: Managed Care, Other (non HMO) | Admitting: Family Medicine

## 2013-05-11 VITALS — BP 126/84 | HR 67 | Temp 98.4°F | Wt 215.2 lb

## 2013-05-11 DIAGNOSIS — R059 Cough, unspecified: Secondary | ICD-10-CM

## 2013-05-11 DIAGNOSIS — R05 Cough: Secondary | ICD-10-CM

## 2013-05-11 MED ORDER — BENZONATATE 200 MG PO CAPS: 200.0000 mg | ORAL_CAPSULE | Freq: Three times a day (TID) | ORAL | Status: DC | PRN

## 2013-05-11 NOTE — Assessment & Plan Note (Signed)
Likely viral given the exam.  Ctab. Nontoxic.  Use tessalon for cough for now.  Check cxr given her hx.  She agrees with plan.

## 2013-05-11 NOTE — Patient Instructions (Signed)
Use zyrtec, nasal saline, and get some rest. Out of work until the cough is improved.  Go to the lab on the way out.  We'll contact you with your xray report. Take care.

## 2013-05-11 NOTE — Progress Notes (Signed)
Pre visit review using our clinic review tool, if applicable. No additional management support is needed unless otherwise documented below in the visit note.  She had fully recovered from PNA, was walking on treadmill 1 mile at a time.  She had a GI illness in the meantime, that resolved.  Sick exposures noted.  Then the cough started in the last week. She thought it was allergies initially, with sneezing and rhinorrhea.  Worse in the meantime.  Fatigue.  Coughing.  Sputum this AM, discolored.  No vomiting. No fevers.  Dec in appetite noted.  The cough feels like it is coming from her throat, not down in her chest.  Meds, vitals, and allergies reviewed.   ROS: See HPI.  Otherwise, noncontributory.  GEN: nad, alert and oriented HEENT: mucous membranes moist, tm w/o erythema, nasal exam w/o erythema, clear discharge noted,  OP with cobblestoning, sinuses not ttp NECK: supple w/o LA CV: rrr.   PULM: ctab, no inc wob EXT: no edema SKIN: no acute rash

## 2013-12-22 ENCOUNTER — Ambulatory Visit: Payer: Self-pay | Admitting: Family Medicine

## 2013-12-25 ENCOUNTER — Encounter: Payer: Self-pay | Admitting: Family Medicine

## 2013-12-26 ENCOUNTER — Encounter: Payer: Self-pay | Admitting: *Deleted

## 2014-01-28 ENCOUNTER — Other Ambulatory Visit: Payer: Self-pay | Admitting: Family Medicine

## 2014-01-28 DIAGNOSIS — I10 Essential (primary) hypertension: Secondary | ICD-10-CM

## 2014-01-30 ENCOUNTER — Other Ambulatory Visit (INDEPENDENT_AMBULATORY_CARE_PROVIDER_SITE_OTHER): Payer: Managed Care, Other (non HMO)

## 2014-01-30 DIAGNOSIS — I10 Essential (primary) hypertension: Secondary | ICD-10-CM

## 2014-01-30 LAB — LIPID PANEL
Cholesterol: 228 mg/dL — ABNORMAL HIGH (ref 0–200)
HDL: 40.2 mg/dL
NonHDL: 187.8
Total CHOL/HDL Ratio: 6
Triglycerides: 300 mg/dL — ABNORMAL HIGH (ref 0.0–149.0)
VLDL: 60 mg/dL — ABNORMAL HIGH (ref 0.0–40.0)

## 2014-01-30 LAB — COMPREHENSIVE METABOLIC PANEL WITH GFR
ALT: 38 U/L — ABNORMAL HIGH (ref 0–35)
AST: 38 U/L — ABNORMAL HIGH (ref 0–37)
Albumin: 4.2 g/dL (ref 3.5–5.2)
Alkaline Phosphatase: 51 U/L (ref 39–117)
BUN: 14 mg/dL (ref 6–23)
CO2: 28 meq/L (ref 19–32)
Calcium: 9.4 mg/dL (ref 8.4–10.5)
Chloride: 102 meq/L (ref 96–112)
Creatinine, Ser: 0.9 mg/dL (ref 0.4–1.2)
GFR: 73.08 mL/min
Glucose, Bld: 112 mg/dL — ABNORMAL HIGH (ref 70–99)
Potassium: 4.2 meq/L (ref 3.5–5.1)
Sodium: 136 meq/L (ref 135–145)
Total Bilirubin: 0.7 mg/dL (ref 0.2–1.2)
Total Protein: 7.8 g/dL (ref 6.0–8.3)

## 2014-01-30 LAB — TSH: TSH: 1.11 u[IU]/mL (ref 0.35–4.50)

## 2014-01-30 LAB — LDL CHOLESTEROL, DIRECT: LDL DIRECT: 124.2 mg/dL

## 2014-02-06 ENCOUNTER — Encounter: Payer: Self-pay | Admitting: Family Medicine

## 2014-02-06 ENCOUNTER — Ambulatory Visit (INDEPENDENT_AMBULATORY_CARE_PROVIDER_SITE_OTHER): Payer: Managed Care, Other (non HMO) | Admitting: Family Medicine

## 2014-02-06 VITALS — BP 146/84 | HR 61 | Temp 98.7°F | Ht 65.0 in | Wt 231.2 lb

## 2014-02-06 DIAGNOSIS — L680 Hirsutism: Secondary | ICD-10-CM

## 2014-02-06 DIAGNOSIS — E663 Overweight: Secondary | ICD-10-CM

## 2014-02-06 DIAGNOSIS — E559 Vitamin D deficiency, unspecified: Secondary | ICD-10-CM

## 2014-02-06 DIAGNOSIS — E039 Hypothyroidism, unspecified: Secondary | ICD-10-CM

## 2014-02-06 DIAGNOSIS — Z7189 Other specified counseling: Secondary | ICD-10-CM

## 2014-02-06 DIAGNOSIS — Z0000 Encounter for general adult medical examination without abnormal findings: Secondary | ICD-10-CM

## 2014-02-06 DIAGNOSIS — E785 Hyperlipidemia, unspecified: Secondary | ICD-10-CM

## 2014-02-06 DIAGNOSIS — J069 Acute upper respiratory infection, unspecified: Secondary | ICD-10-CM

## 2014-02-06 MED ORDER — METOPROLOL SUCCINATE ER 50 MG PO TB24: ORAL_TABLET | ORAL | Status: DC

## 2014-02-06 MED ORDER — LEVOTHYROXINE SODIUM 50 MCG PO TABS: 50.0000 ug | ORAL_TABLET | Freq: Every day | ORAL | Status: DC

## 2014-02-06 NOTE — Progress Notes (Signed)
Pre visit review using our clinic review tool, if applicable. No additional management support is needed unless otherwise documented below in the visit note.  CPE- See plan.  Routine anticipatory guidance given to patient.  See health maintenance. Tetanus 2011 Flu 2015 Shingles and PNA not due now.  PNA prev done 2014 Pap per gyn, she'll f/u.  DXA not due Mammogram done 2015 Living will d/w pt. Husband designated if patient were incapacitated.  Diet and exercise d/w pt.  She is trying to work this around her job schedule.   Hypothyroid.  On replacement but has had some neck mass enlargement. TSH wnl.  Minimal dysphagia.  Fatigue, scalp hair changes noted.    She has noted some facial hair growth.    Mother died this year.  Condolences offered.   Recent URI sx.  No FCNAVD.  R ear pain recently.  Stuffy.  Some cough.   ST.    PMH and SH reviewed  Meds, vitals, and allergies reviewed.   ROS: See HPI.  Otherwise negative.    GEN: nad, alert and oriented HEENT: mucous membranes moist, tm w/o erythema, nasal exam w/o erythema, clear discharge noted,  OP with cobblestoning NECK: supple w/o LA, she may have some thyroid enlargement but her habitus limits clear delineation of the tissue borders.  CV: rrr.   PULM: ctab, no inc wob EXT: no edema SKIN: no acute rash

## 2014-02-06 NOTE — Patient Instructions (Signed)
Go to the lab on the way out.  We'll contact you with your lab report. Rosaria Ferries will call about your referral for the ultrasound and we'll go from there.  Take care.  Don't change your meds in the meantime. It looks like you have a cold that should gradually improve.  Glad to see you.  Please check with gynecology about your pap smear when you get a chance.

## 2014-02-07 DIAGNOSIS — E785 Hyperlipidemia, unspecified: Secondary | ICD-10-CM | POA: Insufficient documentation

## 2014-02-07 DIAGNOSIS — Z7189 Other specified counseling: Secondary | ICD-10-CM | POA: Insufficient documentation

## 2014-02-07 DIAGNOSIS — J069 Acute upper respiratory infection, unspecified: Secondary | ICD-10-CM | POA: Insufficient documentation

## 2014-02-07 LAB — T3, FREE: T3, Free: 2.5 pg/mL (ref 2.3–4.2)

## 2014-02-07 LAB — LUTEINIZING HORMONE: LH: 23.54 m[IU]/mL

## 2014-02-07 LAB — FOLLICLE STIMULATING HORMONE: FSH: 14.7 m[IU]/mL

## 2014-02-07 LAB — VITAMIN D 25 HYDROXY (VIT D DEFICIENCY, FRACTURES): VITD: 21.76 ng/mL — ABNORMAL LOW (ref 30.00–100.00)

## 2014-02-07 LAB — T4, FREE: FREE T4: 0.86 ng/dL (ref 0.60–1.60)

## 2014-02-07 LAB — TESTOSTERONE: TESTOSTERONE: 66.05 ng/dL — AB (ref 15.00–40.00)

## 2014-02-07 NOTE — Assessment & Plan Note (Signed)
D/w pt about diet and weight loss.   

## 2014-02-07 NOTE — Assessment & Plan Note (Signed)
Looks to be viral, nontoxic, should resolve.  Supportive tx, f/u prn.  D/w pt.

## 2014-02-07 NOTE — Assessment & Plan Note (Signed)
TSH wnl but with likely goiter and hair changes, progressive hirsutism. Check labs today and we'll get u/s done.  We'll proceed from there.  She agrees.

## 2014-02-07 NOTE — Assessment & Plan Note (Signed)
Needs weight loss more than med change. D/w pt about diet and weight loss. Labs d/w pt.

## 2014-02-07 NOTE — Assessment & Plan Note (Signed)
Routine anticipatory guidance given to patient.  See health maintenance. Tetanus 2011 Flu 2015 Shingles and PNA not due now.  PNA prev done 2014 Pap per gyn, she'll f/u.  DXA not due Mammogram done 2015 Living will d/w pt. Husband designated if patient were incapacitated.  Diet and exercise d/w pt.  She is trying to work this around her job schedule.

## 2014-02-07 NOTE — Assessment & Plan Note (Signed)
Recheck vit D.  See notes on labs.

## 2014-02-11 ENCOUNTER — Other Ambulatory Visit: Payer: Self-pay | Admitting: Family Medicine

## 2014-02-11 DIAGNOSIS — L680 Hirsutism: Secondary | ICD-10-CM

## 2014-02-11 DIAGNOSIS — E559 Vitamin D deficiency, unspecified: Secondary | ICD-10-CM

## 2014-02-24 ENCOUNTER — Other Ambulatory Visit: Payer: Self-pay | Admitting: Family Medicine

## 2014-03-07 ENCOUNTER — Ambulatory Visit: Payer: Self-pay | Admitting: Family Medicine

## 2014-03-08 ENCOUNTER — Encounter: Payer: Self-pay | Admitting: Family Medicine

## 2014-03-21 ENCOUNTER — Encounter: Payer: Self-pay | Admitting: Endocrinology

## 2014-03-21 ENCOUNTER — Ambulatory Visit (INDEPENDENT_AMBULATORY_CARE_PROVIDER_SITE_OTHER): Payer: Managed Care, Other (non HMO) | Admitting: Endocrinology

## 2014-03-21 VITALS — BP 150/86 | HR 75 | Resp 14 | Ht 65.0 in | Wt 232.8 lb

## 2014-03-21 DIAGNOSIS — E042 Nontoxic multinodular goiter: Secondary | ICD-10-CM | POA: Insufficient documentation

## 2014-03-21 DIAGNOSIS — R7989 Other specified abnormal findings of blood chemistry: Secondary | ICD-10-CM | POA: Insufficient documentation

## 2014-03-21 DIAGNOSIS — E349 Endocrine disorder, unspecified: Secondary | ICD-10-CM

## 2014-03-21 DIAGNOSIS — E039 Hypothyroidism, unspecified: Secondary | ICD-10-CM

## 2014-03-21 MED ORDER — DEXAMETHASONE 1 MG PO TABS: ORAL_TABLET | ORAL | Status: DC

## 2014-03-21 NOTE — Progress Notes (Signed)
Reason for visit: Hypothyroidism, thyroid nodules, hirsutism  HPI  Alexandria Fry is a 59 y.o.-year-old female, referred by her PCP, Elsie Stain, MD,  for evaluation for hypothyroidism, thyroid nodules, hirsutism.  Hypothyroidism- Patient recalls being diagnosed with hypothyroidism in 2010/2011  following blood work, which by her report was in normal range - but she was started on treatment with levothyroxine based on her symptoms of hypothyroidism. Most recently, she has been on Levothyroxine 50 mcg. Patient has been taking this medication fasting, with water, separate from breakfast > 30 minutes and from pills ( calcium, iron, PPIs, multivitamins)> 4 hours. Denies missing any doses recently. About 3 weeks ago, her generic company changed and her current levothyroxine is working better for her.  Denies any recent use of iodine supplements or herbal medications.   I reviewed pt's thyroid tests: Lab Results  Component Value Date   TSH 1.11 01/30/2014   TSH 0.82 08/04/2012   TSH 1.16 01/22/2012   TSH 0.73 01/19/2011   TSH 0.66 01/14/2010   TSH 1.41 05/20/2007   TSH 1.24 01/18/2007   FREET4 0.86 02/06/2014   FREET4 1.00 08/04/2012   T3FREE 2.5 02/06/2014     Review of systems: [ x  ] complains of    [  ] denies [  ] weight gain-recently stable  [  ] constipation  [ x ] fatigue -always  [  ] dry skin  [  ] cold intolerance-lately [  ] hair loss [  ] noticing any enlargement in size of thyroid [  ] lumps in neck [ x ] dysphagia [  ] change in voice [  ]  SOB with lying down.  she has family history of  thyroid disorders in: none. No Family history of thyroid cancer.  No history of XRT to head or neck.   Thyroid nodules-  Due to history of dysphagia- the patient had a thyroid US done jan 20th, 2016 [facility].  It showed 2 sub cm hypoechoic nodules right thyroid lobe ( 5 x 8mm inferior pole with adjacent 5 x 3 x42mmm, inferior pole) Subtle Isoechoic nodule sub cm mid left lobe  (5x3x25mm) No LAD  I have reviewed the report with her at clinic visit.  Dysphagia is mild since Thanksgiving 2015- occasionally has been choking on her food and feels that her thraot closes - able to eat most foods though No other compressive symptoms  Hirsutism and elevated testosterone- She states that she always has been having excess hair growth on her face, chin, now having more on sideburns, occasionally on chest and abdomen- terminal hair- has been shaving abnormal hair Some recent worsening in this. Recent FSH not in menopausal range.  Recent Testosterone elevated.  Apparently, she has had a workup of this in the past.  Prior menses regular q 5 weeks, then started having irreg periods since age 43. 2 D+C since then for heavy intermittent bleeding. LMP Nov 2015 with spotting x 1 day. Reports pelvic US was done and negative at that time.  Skin tags and acne worsening.  Has been on metformin around 2011. At that time, "there was a lot going on and she was on multiple drugs with sulfa" and she was thought to develop an allergy and taken off metformin. Also, possibly had GI upset with metformin. Had been on high protein, low carb diet 2012, cant follow that anymore- lowest weight 180 lbs- recently stressed and having more work lately. hasnt been able to keep up with lifestyle.  No recent steroids, no abnormal stretch marks No change in shoe size/ring size Hx preDM  Lab Results  Component Value Date   TESTOSTERONE 66.05* 02/06/2014    I have reviewed the patient's past medical history, family and social history, surgical history, medications and allergies.    Past Medical History  Diagnosis Date  . Allergic rhinitis   . Hypertension   . Murmur, cardiac   . Hypothyroid   . Irregular menses     with neg endometrial biopsy 2013  . Pneumonia     2014   Past Surgical History  Procedure Laterality Date  . Vaginal delivery      NSVD x 2  . Dilation and curettage of uterus  1981     [facility]   . Dilation and curettage of uterus  06/25/2009    normal (Dr. Laurey Morale)   Family History  Problem Relation Age of Onset  . Cancer Mother 50    breast with a mastectomy that was hormone receptor  and rectal cancer at age 63 with a protectomy, ileostomy and colectomy, multiple polyps  . Diabetes Mother   . Hypertension Mother   . Colon cancer Mother   . Breast cancer Mother   . Osteoporosis Mother   . Cancer Father     pancreatic  . Colon polyps Father   . Alcohol abuse Father   . Heart disease Father   . Early death Sister 59    suicide  . Thyroid disease Neg Hx    History   Social History  . Marital Status: Married    Spouse Name: N/A    Number of Children: 2  . 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 Smoking status: Former Smoker -- 1.00 packs/day for 10 years    Quit date: 01/20/1982  . Smokeless tobacco: Never Used     Comment: quit for 28 years  . Alcohol Use: 0.0 oz/week     Comment: white wine  . Drug Use: No  . Sexual Activity: Yes   Other Topics Concern  . Not on file   Social History Narrative   Lives with husband   1 son at home   1 out of the house   Current Outpatient Prescriptions on File Prior to Visit  Medication Sig Dispense Refill  . cholecalciferol (VITAMIN D) 1000 UNITS tablet Take 5,000 Units by mouth daily.     Marland Kitchen levothyroxine (SYNTHROID) 50 MCG tablet Take 1 tablet (50 mcg total) by mouth daily. 90 tablet 3  . loteprednol (ALREX) 0.2 % SUSP 1 drop daily as needed.    . metoprolol succinate (TOPROL-XL) 50 MG 24 hr tablet Take 1 by mouth twice a day 180 tablet 3  . Multiple Vitamin (MULTIVITAMIN) tablet Take 1 tablet by mouth daily.     No current facility-administered medications on file prior to visit.   Allergies  Allergen Reactions  . Ace Inhibitors     tachycardia  . Amoxicillin-Pot Clavulanate     REACTION: Swelling  . Ceftin [Cefuroxime Axetil]      Rash  . Diphenhydramine Hcl     REACTION: Burns skin  . Hydrochlorothiazide W-Triamterene     REACTION: Hives and GI upset  . Hydrocodone-Acetaminophen     REACTION: Headache  . Levaquin [Levofloxacin In D5w] Itching    Severe itching and flushing.  . Metformin And Related  hives  . Nitrofurantoin     REACTION: Swelling, SOB  . Oxycodone-Acetaminophen     REACTION: Headache  . Sulfamethoxazole-Trimethoprim     REACTION: Hives and swollen neck  . Sulfonamide Derivatives     REACTION: Hives, tightness in chest  . Zithromax [Azithromycin] Hives      ROS: Review of Systems: [x]  complains of  [  ] denies General:   [  ] Recent weight change [ x ] Fatigue  [  ] Loss of appetite Eyes: [x  ]  Vision Difficulty [  ]  Eye pain ENT: [  ]  Hearing difficulty [  x]  Difficulty Swallowing CVS: [  ] Chest pain [  ]  Palpitations/Irregular Heart beat [  ]  Shortness of breath lying flat [ x ] Swelling of legs Resp: [  ] Frequent Cough [  ] Shortness of Breath  [  ]  Wheezing GI: [ x ] Heartburn  [  ] Nausea or Vomiting  [ x ] Diarrhea [  ] Constipation  [  ] Abdominal Pain GU: [  ]  Polyuria  [  ]  nocturia Bones/joints:  [ x ]  Muscle aches  [ x ] Joint Pain  [  ] Bone pain Skin/Hair/Nails: [  ]  Rash  [  ] New stretch marks [ x ]  Itching [  ] Hair loss [ x ]  Excessive hair growth Reproduction: [  ] Low sexual desire , [  ]  Women: Menstrual cycle problems [  ]  Women: Breast Discharge [  ] Men: Difficulty with erections [  ]  Men: Enlarged Breasts CNS: [  ] Frequent Headaches [  ] Blurry vision [  ] Tremors [  ] Seizures [  ] Loss of consciousness [  ] Localized weakness Endocrine: [  ]  Excess thirst [  ]  Feeling excessively hot [  x]  Feeling excessively cold Heme: [  ]  Easy bruising [ x ]  Enlarged glands or lumps in neck Allergy: [  ]  Food allergies [ x ] Environmental allergies  PE: BP 150/86 mmHg  Pulse 75  Resp 14  Ht 5\' 5"  (1.651 m)  Wt 232 lb 12 oz (105.575 kg)   BMI 38.73 kg/m2  SpO2 94% Wt Readings from Last 3 Encounters:  03/21/14 232 lb 12 oz (105.575 kg)  02/06/14 231 lb 4 oz (104.894 kg)  05/11/13 215 lb 4 oz (97.637 kg)    HEENT: [place]/AT, EOMI, no icterus, no proptosis, no chemosis, no mild lid lag, no retraction, eyes close completely, round facies, hirsutism over chin+ Neck: thyroid gland - smooth, mild enlargement, non-tender, no erythema, no tracheal deviation; negative Pemberton's sign; no lymphadenopathy; no bruits Lungs: good air entry, clear bilaterally Heart: S1&S2 normal, regular rate & rhythm; no murmurs, rubs or gallops Abd: soft, NT, ND, no HSM, +BS, no abnormal straie Ext: no tremor in hands bilaterally, no edema, 2+ DP/PT pulses, good muscle mass Neuro: normal gait, 2+ reflexes bilaterally, normal 5/5 strength, no proximal myopathy  Derm: no pretibial myxoedema/skin dryness   ASSESSMENT: 1. Hypothyroidism 2. Thyroid nodules 3. Elevated testosterone  Problem List Items Addressed This Visit      Endocrine   Hypothyroidism    We discussed about hypothyroidism-signs, symptoms and common lab testing.  It is not clear whether her labs at diagnosis were abnormal for TSH but she was symptomatic and by her report, that is why she was  started on thyroid hormone. TSH on EMR review around that time have been in normal range.  Her generic levothyroxine company changed recently. I have asked her to continue current levothyroxine for now and repeat levels for thyroid in 4 weeks. At that time, we can try to wean her off the thyroid medication if possible based on symptoms.  She is agreeable.  Would not recommend levothyroxine for nodule shrinkage.        Non-toxic multinodular goiter    Reviewed with her thyroid nodule etiology, pathology, follow up , risk of malignancy between 5-15% Currently, her nodules are sub cm and without mention of any concerning features. No risk factors for thyroid cancer.  Would recommend continue to  follow up with serial thyroid US and repeat US in 1 year ( Jan 2017) to assess nodule stability.  Notify sooner if compressive symptoms worsen or notices a nodule in her neck.   I dont think that the dysphagia is related to the thyroid at this time, given her recent thyroid US findings. If this continues to a symptom, she was asked to follow back with her PCP for further evaluation.         Other   Elevated testosterone level in female - Primary    We discussed that she most likely has insulin resistance and PCOS. Diagnosis is difficult as she is perimenopausal age , but given her [place] values -wonder how much of this recent menstrual irregularity is related to PCOS rather than age.   Will rule out subclinical Cushing's given her habitus and also check for screening IGF-1, PRL, A1c.   If this workup is negative, given the elevated Testosterone levels and her age, will get a follow up pelvic US to rule out ovarian masses.  Following that can address treatment - either trial of metformin versus treatment for hirsutism with Spironolactone. Discussed healthy lifestyle measures in the interim.   She is agreeable. RTC 1 month      Relevant Medications   dexamethasone (DECADRON) tablet   Other Relevant Orders   Cortisol   Insulin-like growth factor   Prolactin   Hemoglobin A1c       Ryka Beighley PUSHKAR  03/22/2014   11:50 AM

## 2014-03-21 NOTE — Patient Instructions (Signed)
Healthy eating , regular exercise, weight loss.   You should do a "dexamethasone suppression test."  for this- Take dexamethasone 1 mg at 11 pm, then come in for a "cortisol" blood test the next morning between 8-9 am. You do not need to be fasting for this test.  Further testing/treatment based on labs.   Obtain old pelvic US reports.   Thyroid US follow up in 1 year.

## 2014-03-21 NOTE — Progress Notes (Signed)
Pre visit review using our clinic review tool, if applicable. No additional management support is needed unless otherwise documented below in the visit note. 

## 2014-03-22 ENCOUNTER — Encounter: Payer: Self-pay | Admitting: Endocrinology

## 2014-03-22 NOTE — Assessment & Plan Note (Signed)
We discussed about hypothyroidism-signs, symptoms and common lab testing.  It is not clear whether her labs at diagnosis were abnormal for TSH but she was symptomatic and by her report, that is why she was started on thyroid hormone. TSH on EMR review around that time have been in normal range.  Her generic levothyroxine company changed recently. I have asked her to continue current levothyroxine for now and repeat levels for thyroid in 4 weeks. At that time, we can try to wean her off the thyroid medication if possible based on symptoms.  She is agreeable.  Would not recommend levothyroxine for nodule shrinkage.

## 2014-03-22 NOTE — Assessment & Plan Note (Addendum)
We discussed that she most likely has insulin resistance and PCOS. Diagnosis is difficult as she is perimenopausal age , but given her [place] values -wonder how much of this recent menstrual irregularity is related to PCOS rather than age.   Will rule out subclinical Cushing's given her habitus and also check for screening IGF-1, PRL, A1c.   If this workup is negative, given the elevated Testosterone levels and her age, will get a follow up pelvic US to rule out ovarian masses.  Following that can address treatment - either trial of metformin versus treatment for hirsutism with Spironolactone. Discussed healthy lifestyle measures in the interim.   She is agreeable. RTC 1 month

## 2014-03-22 NOTE — Assessment & Plan Note (Signed)
Reviewed with her thyroid nodule etiology, pathology, follow up , risk of malignancy between 5-15% Currently, her nodules are sub cm and without mention of any concerning features. No risk factors for thyroid cancer.  Would recommend continue to follow up with serial thyroid US and repeat US in 1 year ( Jan 2017) to assess nodule stability.  Notify sooner if compressive symptoms worsen or notices a nodule in her neck.   I dont think that the dysphagia is related to the thyroid at this time, given her recent thyroid US findings. If this continues to a symptom, she was asked to follow back with her PCP for further evaluation.

## 2014-03-30 ENCOUNTER — Other Ambulatory Visit (INDEPENDENT_AMBULATORY_CARE_PROVIDER_SITE_OTHER): Payer: Managed Care, Other (non HMO)

## 2014-03-30 DIAGNOSIS — E349 Endocrine disorder, unspecified: Secondary | ICD-10-CM

## 2014-03-30 LAB — HEMOGLOBIN A1C: HEMOGLOBIN A1C: 5.7 % (ref 4.6–6.5)

## 2014-03-30 LAB — CORTISOL: Cortisol, Plasma: 1.3 ug/dL

## 2014-03-31 LAB — PROLACTIN: Prolactin: 2.9 ng/mL

## 2014-04-05 ENCOUNTER — Other Ambulatory Visit: Payer: Self-pay | Admitting: Endocrinology

## 2014-04-05 DIAGNOSIS — R7989 Other specified abnormal findings of blood chemistry: Secondary | ICD-10-CM

## 2014-04-05 LAB — INSULIN-LIKE GROWTH FACTOR
IGF-I, LC/MS: 104 ng/mL (ref 50–317)
Z-Score (Female): -0.5 SD (ref ?–2.0)

## 2014-04-12 ENCOUNTER — Ambulatory Visit: Payer: Self-pay | Admitting: Endocrinology

## 2014-04-17 LAB — HM PAP SMEAR: HM PAP: NORMAL

## 2014-04-18 ENCOUNTER — Ambulatory Visit (INDEPENDENT_AMBULATORY_CARE_PROVIDER_SITE_OTHER): Payer: Managed Care, Other (non HMO) | Admitting: Endocrinology

## 2014-04-18 ENCOUNTER — Encounter: Payer: Self-pay | Admitting: Endocrinology

## 2014-04-18 VITALS — BP 144/82 | HR 78 | Resp 14 | Ht 65.0 in | Wt 235.2 lb

## 2014-04-18 DIAGNOSIS — E039 Hypothyroidism, unspecified: Secondary | ICD-10-CM

## 2014-04-18 DIAGNOSIS — E349 Endocrine disorder, unspecified: Secondary | ICD-10-CM

## 2014-04-18 DIAGNOSIS — R7989 Other specified abnormal findings of blood chemistry: Secondary | ICD-10-CM

## 2014-04-18 NOTE — Assessment & Plan Note (Signed)
It is not clear whether her labs at diagnosis were abnormal for TSH but she was symptomatic and by her report, that is why she was started on thyroid hormone. TSH on EMR review around that time have been in normal range.  Her generic levothyroxine company changed about 6 weeks ago. I have asked her to continue current levothyroxine for now and repeat levels for thyroid today. Based on results from today, we can try to wean her off the thyroid medication if possible based on symptoms.  She is agreeable.  Would not recommend levothyroxine for nodule shrinkage.

## 2014-04-18 NOTE — Progress Notes (Signed)
Pre visit review using our clinic review tool, if applicable. No additional management support is needed unless otherwise documented below in the visit note. 

## 2014-04-18 NOTE — Assessment & Plan Note (Signed)
We discussed that she most likely has insulin resistance and PCOS. Diagnosis is difficult as she is perimenopausal age , but given her [place] values -wonder how much of this recent menstrual irregularity is related to PCOS rather than age. She has follow up with her GYN specialist tomorrow, and will show him a copy of her report of recent pelvic US for review.   I will talk to the radiologist to see whether an re-examination could be done to look for the right ovary  With [place]. If not, then would probably consider CT abdomen/pelvis.    Discussed treatment options- either trial of metformin versus treatment for hirsutism with Spironolactone versus local creams. She hasn't tolerated metformin in the past. Given her multiple intolerances to medications, she is hesistent to make any changes to her current medications or try any new medications at this time. Vaniqa cream probably too expensive for her at this time.  Discussed healthy lifestyle measures in the interim and encouraged her to follow this.Recheck Testosterone levels to assess trend.    She is agreeable.

## 2014-04-18 NOTE — Progress Notes (Signed)
Reason for visit: Hypothyroidism, thyroid nodules, hirsutism  HPI  Alexandria Fry is a 59 y.o.-year-old female,  for evaluation for hypothyroidism, thyroid nodules, hirsutism.  Hypothyroidism- Patient recalls being diagnosed with hypothyroidism in 2010/2011  following blood work, which by her report was in normal range - but she was started on treatment with levothyroxine based on her symptoms of hypothyroidism. Most recently, she has been on Levothyroxine 50 mcg. Patient has been taking this medication fasting, with water, separate from breakfast > 30 minutes and from pills ( calcium, iron, PPIs, multivitamins)> 4 hours. Denies missing any doses recently. About 7 weeks ago, her generic company changed and her current levothyroxine is working better for her.  Denies any recent use of iodine supplements or herbal medications.   I reviewed pt's thyroid tests: Lab Results  Component Value Date   TSH 1.11 01/30/2014   TSH 0.82 08/04/2012   TSH 1.16 01/22/2012   TSH 0.73 01/19/2011   TSH 0.66 01/14/2010   TSH 1.41 05/20/2007   TSH 1.24 01/18/2007   FREET4 0.86 02/06/2014   FREET4 1.00 08/04/2012   T3FREE 2.5 02/06/2014     Review of systems: [ x  ] complains of    [  ] denies [ x ] weight gain-recently stable  [  ] constipation  [ x ] fatigue -less foggy [  ] dry skin  [  ] cold intolerance [  ] hair loss [  ] noticing any enlargement in size of thyroid [  ] lumps in neck [ x ] dysphagia [  ] change in voice [x  ]  SOB with lying down- sinus trouble.  she has family history of  thyroid disorders in: none. No Family history of thyroid cancer.  No history of XRT to head or neck.   Thyroid nodules-  Due to history of dysphagia- the patient had a thyroid US done jan 20th, 2016 [facility].  It showed 2 sub cm hypoechoic nodules right thyroid lobe ( 5 x 37mm inferior pole with adjacent 5 x 3 x55mmm, inferior pole) Subtle Isoechoic nodule sub cm mid left lobe (5x3x12mm) No LAD  I have  reviewed the report with her at clinic visit.  Dysphagia is mild since Thanksgiving 2015- occasionally has been choking on her food and feels that her throat closes - able to eat most foods though No other compressive symptoms  Hirsutism and elevated testosterone- She states that she always has been having excess hair growth on her face, chin, now having more on sideburns, occasionally on chest and abdomen- terminal hair- has been shaving abnormal hair Some recent worsening in this. Recent FSH not in menopausal range.  Recent Testosterone elevated.  Apparently, she has had a workup of this in the past.  Prior menses regular q 5 weeks, then started having irreg periods since age 31. 2 D+C since then for heavy intermittent bleeding. LMP Nov 2015 with spotting x 1 day. Reports pelvic US was done and negative at that time.  Skin tags and acne worsening. Now reports that she probably has a cyst in her vaginal area too that recently ruptured.  Has been on metformin around 2011. At that time, "there was a lot going on and she was on multiple drugs with sulfa" and she was thought to develop an allergy and taken off metformin. Also, possibly had GI upset with metformin. Had been on high protein, low carb diet 2012, cant follow that anymore- lowest weight 180 lbs- recently stressed and having  more work lately. hasnt been able to keep up with lifestyle. Going to give up job April/may and plans to work on healthier lifestyle then.  No recent steroids, no abnormal stretch marks No change in shoe size/ring size Hx preDM  Lab Results  Component Value Date   TESTOSTERONE 66.05* 02/06/2014   Lab Results  Component Value Date   HGBA1C 5.7 03/30/2014   Since last visit, had negative 1 mg dex suppression test, normal PRL, IGF-1 levels as well.  Recd results of transabdominal and transvaginal US done 04/12/14- Uterus -no fibroids, 32mm cyst region of cervix most likely nabothian cyst, 12 mm uterine  thickness Right ovary not visualized. Left ovary- 2.1  X 1.3 x1.5cm- no mass/normal appearance/no adnexal mass  I have reviewed the patient's past medical history, medications and allergies.    Past Medical History  Diagnosis Date  . Allergic rhinitis   . Hypertension   . Murmur, cardiac   . Hypothyroid   . Irregular menses     with neg endometrial biopsy 2013  . Pneumonia     2014   Past Surgical History  Procedure Laterality Date  . Vaginal delivery      NSVD x 2  . Dilation and curettage of uterus  1981    [facility]   . Dilation and curettage of uterus  06/25/2009    normal (Dr. Laurey Morale)   Family History  Problem Relation Age of Onset  . Cancer Mother 47    breast with a mastectomy that was hormone receptor  and rectal cancer at age 65 with a protectomy, ileostomy and colectomy, multiple polyps  . Diabetes Mother   . Hypertension Mother   . Colon cancer Mother   . Breast cancer Mother   . Osteoporosis Mother   . Cancer Father     pancreatic  . Colon polyps Father   . Alcohol abuse Father   . Heart disease Father   . Early death Sister 58    suicide  . Thyroid disease Neg Hx    History   Social History  . Marital Status: Married    Spouse Name: N/A  . Number of Children: 2  . 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 Smoking status: Former Smoker -- 1.00 packs/day for 10 years    Quit date: 01/20/1982  . Smokeless tobacco: Never Used     Comment: quit for 28 years  . Alcohol Use: 0.0 oz/week     Comment: white wine  . Drug Use: No  . Sexual Activity: Yes   Other Topics Concern  . Not on file   Social History Narrative   Lives with husband   1 son at home   1 out of the house   Current Outpatient Prescriptions on File Prior to Visit  Medication Sig Dispense Refill  . cholecalciferol (VITAMIN D) 1000 UNITS tablet Take 5,000 Units by mouth daily.     Marland Kitchen levothyroxine (SYNTHROID)  50 MCG tablet Take 1 tablet (50 mcg total) by mouth daily. 90 tablet 3  . loteprednol (ALREX) 0.2 % SUSP 1 drop daily as needed.    . metoprolol succinate (TOPROL-XL) 50 MG 24 hr tablet Take 1 by mouth twice a day 180 tablet 3  . Multiple Vitamin (MULTIVITAMIN) tablet Take 1 tablet by mouth daily.     No current facility-administered medications on file prior to visit.  Allergies  Allergen Reactions  . Ace Inhibitors     tachycardia  . Amoxicillin-Pot Clavulanate     REACTION: Swelling  . Ceftin [Cefuroxime Axetil]     Rash  . Diphenhydramine Hcl     REACTION: Burns skin  . Hydrochlorothiazide W-Triamterene     REACTION: Hives and GI upset  . Hydrocodone-Acetaminophen     REACTION: Headache  . Levaquin [Levofloxacin In D5w] Itching    Severe itching and flushing.  . Metformin And Related     hives  . Nitrofurantoin     REACTION: Swelling, SOB  . Oxycodone-Acetaminophen     REACTION: Headache  . Sulfamethoxazole-Trimethoprim     REACTION: Hives and swollen neck  . Sulfonamide Derivatives     REACTION: Hives, tightness in chest  . Zithromax [Azithromycin] Hives   ROS: as above  PE: BP 144/82 mmHg  Pulse 78  Resp 14  Ht 5\' 5"  (1.651 m)  Wt 235 lb 4 oz (106.709 kg)  BMI 39.15 kg/m2  SpO2 95% Wt Readings from Last 3 Encounters:  04/18/14 235 lb 4 oz (106.709 kg)  03/21/14 232 lb 12 oz (105.575 kg)  02/06/14 231 lb 4 oz (104.894 kg)    Exam: deferred   ASSESSMENT:   Problem List Items Addressed This Visit      Endocrine   Hypothyroidism    It is not clear whether her labs at diagnosis were abnormal for TSH but she was symptomatic and by her report, that is why she was started on thyroid hormone. TSH on EMR review around that time have been in normal range.  Her generic levothyroxine company changed about 6 weeks ago. I have asked her to continue current levothyroxine for now and repeat levels for thyroid today. Based on results from today, we can try to wean  her off the thyroid medication if possible based on symptoms.  She is agreeable.  Would not recommend levothyroxine for nodule shrinkage.          Relevant Orders   TSH   T4, free     Other   Elevated testosterone level in female    We discussed that she most likely has insulin resistance and PCOS. Diagnosis is difficult as she is perimenopausal age , but given her [place] values -wonder how much of this recent menstrual irregularity is related to PCOS rather than age. She has follow up with her GYN specialist tomorrow, and will show him a copy of her report of recent pelvic US for review.   I will talk to the radiologist to see whether an re-examination could be done to look for the right ovary  With [place]. If not, then would probably consider CT abdomen/pelvis.    Discussed treatment options- either trial of metformin versus treatment for hirsutism with Spironolactone versus local creams. She hasn't tolerated metformin in the past. Given her multiple intolerances to medications, she is hesistent to make any changes to her current medications or try any new medications at this time. Vaniqa cream probably too expensive for her at this time.  Discussed healthy lifestyle measures in the interim and encouraged her to follow this.Recheck Testosterone levels to assess trend.    She is agreeable.        Relevant Orders   Testosterone,Free and Total     RTC 4 months  Jheremy Boger [facility] [place]  04/18/2014   3:19 PM

## 2014-04-18 NOTE — Patient Instructions (Addendum)
Work on diet and exercise for now.   Will talk to radiology and see what additional investigation is needed to rule our right ovarian mass.  She will also fwd result to her GYN physician for review.  Labs today and assess whether thyroid hormone could be weaned off.   Please come back for a follow-up appointment in 4 months

## 2014-04-19 LAB — TESTOSTERONE,FREE AND TOTAL
TESTOSTERONE: 48 ng/dL — AB (ref 3–41)
Testosterone, Free: 6.9 pg/mL — ABNORMAL HIGH (ref 0.0–4.2)

## 2014-04-19 LAB — T4, FREE: FREE T4: 0.9 ng/dL (ref 0.60–1.60)

## 2014-04-19 LAB — TSH: TSH: 1.23 u[IU]/mL (ref 0.35–4.50)

## 2014-04-20 ENCOUNTER — Encounter: Payer: Self-pay | Admitting: *Deleted

## 2014-05-10 ENCOUNTER — Encounter: Payer: Self-pay | Admitting: Endocrinology

## 2014-06-08 NOTE — Consult Note (Signed)
PATIENT NAME:  Alexandria Fry, Alexandria Fry MR#:  [id number] DATE OF BIRTH:  01/24/1956  DATE OF CONSULTATION:  10/06/2012  REFERRING PHYSICIAN:   CONSULTING PHYSICIAN:  Allyne Gee, MD  PULMONARY CONSULTATION  REASON FOR CONSULTATION:  Pneumonia.  A 59 year old Caucasian female really no significant past medical history presents to the hospital with having about a week's worth of coughing and shortness of breath. She noted that initially she went to her primary care physician and at that time was thought that she might have the flu; however, she did not improve and was now having palpitations, tachycardia and worsening shortness of breath and also was having night sweats and fevers. The patient came into the Emergency Room and was evaluated, had a chest x-ray done which showed pneumonia in the left side and has also had a CT scan which confirms the findings. No masses were seen. She was also noted to have saturations of only about 91% and had a significant elevation in her white blood cell count.   PAST MEDICAL HISTORY:  Significant for hypertension, hypothyroidism.   SOCIAL HISTORY: She says she does not smoke or drink. She had secondhand smoke exposure as a child and also did smoke for about 10 years from age 37 to 35. No alcohol or drug use is noted.   FAMILY HISTORY: Positive for coronary disease and.   ALLERGIES: Are multiple, INCLUDE SULFA, MACROBID, METFORMIN, BENADRYL, AMOXICILLIN, AUGMENTIN AND POSSIBLY LEVAQUIN.  HOME MEDICATIONS:  Are reviewed on the electronic chart.  REVIEW OF SYSTEMS:  GENERAL:  She had been having some fatigue and fever and chills and night sweats.  HEENT:  Negative for diplopia. No epistaxis. She has no tinnitus.  RESPIRATORY:  Positive for shortness of breath. CARDIAC:  Positive for palpitations.   GENITOURINARY:  Negative for hematuria.  GASTROINTESTINAL:  Negative for diarrhea, nausea or vomiting.  SKIN:  She had no papules or macules.  HEMATOLOGIC:   Negative for bruising.  MUSCULOSKELETAL: No synovitis.  The remainder of complete review of systems performed was unremarkable.   PHYSICAL EXAMINATION: GENERAL:  At the time that she was seen, she was awake, sitting up in a chair somewhat short of breath.  VITAL SIGNS: Temp was 98, pulse about 67, respiratory rate 20, blood pressure 146/85, saturations were 92%.  NECK:  Supple. There was no JVD and no adenopathy, no thyromegaly.  CHEST:  Showed good air entry bilaterally. There were rhonchi on the left side. No rales on the right.  CARDIOVASCULAR:  S1, S2 is normal. Regular rhythm. No gallop or rub.  ABDOMEN:  Soft and benign.  EXTREMITIES: Without cyanosis or clubbing. No edema. Pulses equal. Mouth was clear. Extraocular movements are intact. NEUROLOGIC:  She was awake and alert, moving all 4 extremities.  SKIN:  Without any acute rashes.  MUSCULOSKELETAL: Without any synovitis.  CT scan results show a significant infiltrate, left side, with air bronchograms.   LABORATORY DATA:  Her white count is 9.5. Hemoglobin was 13.3. Initial white count when she came in was 21.6. Chemistries: Glucose is 102, BUN 7, creatinine 0.75, sodium 138, k 3.6. The patient's serum CO2 was 34. She had strep antigen negative. Legionella titers were also sent and it was negative.   IMPRESSION:  Acute pneumonia, likely bacterial versus viral.   PLAN:  She is currently on azithromycin, vancomycin and aztreonam. Azithromycin is in the form of tablet that is 500 mg a day. She has significant infiltrate and, clinically, however, she seems to be stable and  does not show any decline. At this point, I would continue with the antibiotics and, of course, the chest x-ray picture can lag behind her clinical improvement and so, therefore, I would monitor her clinically at this point. If she does not have ongoing improvement, then I would proceed with doing a bronchoscopy. My feeling is that she is going to need prolonged course  of antibiotics and I would probably keep her here through the weekend and see how she is doing on Monday to consider if she needs to have a bronchoscopy. Would make further recommendations as deemed necessary.     ____________________________ Allyne Gee, MD sak:ce D: 10/06/2012 12:23:14 ET T: 10/06/2012 12:36:19 ET JOB#: [id number]  cc: Allyne Gee, MD, <Dictator> Allyne Gee MD ELECTRONICALLY SIGNED 10/15/2012 16:47

## 2014-06-08 NOTE — H&P (Signed)
PATIENT NAME:  CHAIRTY, TOMAN MR#:  [id number] DATE OF BIRTH:  [birth year]  DATE OF ADMISSION:  09/30/2012  ADDENDUM TO HISTORY AND PHYSICAL   MEDICATION LIST:  1.  Synthroid 0.05 mg oral daily.  2.  Metoprolol extended release 50 mg oral b.i.d.  3.  Vitamin D 1000 international units oral daily.    ____________________________ Albertine Patricia, MD dse:nts D: 10/01/2012 00:03:19 ET T: 10/01/2012 00:52:03 ET JOB#: [id number]  cc: Albertine Patricia, MD, <Dictator> Steel Kerney Graciela Husbands MD ELECTRONICALLY SIGNED 10/08/2012 12:19

## 2014-06-08 NOTE — Discharge Summary (Signed)
PATIENT NAME:  Alexandria Fry, Alexandria Fry MR#:  [id number] DATE OF BIRTH:  [birth year]  DATE OF ADMISSION:  09/30/2012 DATE OF DISCHARGE:  10/10/2012  ADMITTING DIAGNOSIS: Shortness of breath, cough.   DISCHARGE DIAGNOSES:  1. Shortness of breath and cough due to multilobar pneumonia.  2. Severe sepsis.  3. Pericardial effusion noted on CT scan, without any echocardiogram evidence of effusion.  4. Acute hypoxic respiratory failure. The patient's respiratory status improved; however, needs home oxygen likely for the next 1 or 2 weeks.  5. Hypothyroidism.  6. Hypertension.   CONSULTANTS: Dr. Humphrey Rolls of pulmonary.   PERTINENT LABORATORY AND EVALUATIONS: Admitting WBC count 10.6, hemoglobin 13.6, platelet count 247. CT scan of the chest showed dense left upper lobe and left lower lobe infiltrate, right perihilar infiltrate. Small bilateral effusions, small cardiac effusion. Admitting labs: Glucose 102, BUN 7, creatinine 0.75, sodium 138, potassium 3.6, chloride 101, CO2 34. WBC 9.5, hemoglobin 13.3, platelet count 277. Echocardiogram shows normal EF, mildly dilated left atrium.   HOSPITAL COURSE: Please see H and P done by the admitting physician. The patient is a pleasant 59 year old white female who presented with shortness of breath, cough. She was seen by primary care provider and was noted to have a pneumonia. The patient was noted to have leukocytosis, and the patient was started on IV antibiotics. Because of her allergies, she was treated with azithromycin and vancomycin and aztreonam.  The patient received a total of 10 days of these antibiotics. She was continuing to have a problem with hypoxia. Therefore, a CT scan of the chest was done without contrast. It did show multilobar pneumonia. She was also seen in consultation by Dr. Humphrey Rolls of pulmonary. He felt that if her hypoxia did not improve, she would need a bronchoscopy; however, she is doing much better. She is on 2.5 liters of oxygen and is able to  ambulate and is eating good, has been afebrile. WBC count is normal. Therefore, she is being discharged at this point. She has received enough antibiotics that she does not need any further antibiotics, plus she has multiple allergies.   DISCHARGE MEDICATIONS:  1. Synthroid 50 mcg daily.  2. Toprol-XL 50 mg 1 tab p.o. b.i.d. 3. Vitamin D 1000 international units daily. 4. Loteprednol 0.2% 1 drop to each affected eye as needed for inflammation. 5. Garamycin apply topically to affected area 2 times a day. 6. Tylenol 650 q.4 p.r.n. 7. Guaifenesin 600 mg 1 tab p.o. b.i.d. 8. O2 via nasal cannula at 2.5 liters.   DIET: Low sodium.   ACTIVITY: As tolerated.   FOLLOWUP:  1. Follow with Dr. Humphrey Rolls in 1 to 2 weeks.  2. Follow with primary MD in 1 to 2 weeks.   TIME SPENT: Note, 35 minutes spent on this discharge.    ____________________________ Lafonda Mosses. Posey Pronto, MD shp:OSi D: 10/11/2012 12:00:01 ET T: 10/11/2012 13:13:18 ET JOB#: [id number]  cc: Fin Hupp H. Posey Pronto, MD, <Dictator> Alric Seton MD ELECTRONICALLY SIGNED 10/11/2012 18:26

## 2014-06-08 NOTE — H&P (Signed)
PATIENT NAME:  Alexandria Fry, Alexandria Fry MR#:  [id number] DATE OF BIRTH:  1955-06-13  DATE OF ADMISSION:  09/30/2012  REFERRING PHYSICIAN:  Dr. Harvest Dark.  PRIMARY CARE PHYSICIAN:  Dr. Elsie Stain.   CHIEF COMPLAINT:  Shortness of breath, cough, sent by primary care physician for pneumonia.   HISTORY OF PRESENT ILLNESS:  This is a 59 year old female with significant past medical history of hypothyroidism and hypertension who presents with complaints of feeling sick for one week, the patient reports she was febrile at home, she was seen by PCP who did check a chest x-ray where she did have evidence of pneumonia so he sent her to ED due to her history of multiple allergies to antibiotics, the patient reports she had fever at home 99.6, where she took some Advil so she was afebrile in ED, but the patient was noticed to be hypoxic, saturating 91% on room air, requiring oxygen, the patient had chest x-ray done which did show a left lung infiltrate, the patient was found to have white blood cells at 21,000, the patient had blood cultures done in the ED, the patient denies any chest pain, any recent travel, any leg swelling, any hemoptysis, any productive sputum, but feels congested, as well reports some viral symptoms including some sinus drainage, though she denies any history of smoking, reports she quit more than 30 years ago, hospitalist service requested to admit the patient for IV antibiotic due to multiple allergies and hypoxia.   PAST MEDICAL HISTORY: 1.  Hypothyroidism.  2.  Hypertension.   PAST SURGICAL HISTORY:  D and C in the past.   SOCIAL HISTORY:  The patient used to smoke from age 5 to 41.  She quit more than 30 years ago.  No alcohol.  No illicit drug use.   FAMILY HISTORY:  Significant for coronary artery disease.  Father had CABG at the age of 53.   ALLERGIES: 1.  ACE INHIBITOR.  2.  AMOXICILLIN.  3.  AUGMENTIN.  4.  BENADRYL.  5.  MACROBID.  6.  METFORMIN.  7.  SULFA  DRUGS.   HOME MEDICATIONS: 1.  Vitamin D 1000 units by mouth daily.  2.  Gentamicin sulfate ointment topically 2 times daily.  3.  Levothyroxine 50 mcg oral daily.  5.  Metoprolol succinate extended release 50 mg oral 2 times a day.   REVIEW OF SYSTEMS: CONSTITUTIONAL:  The patient complains of fever, chills, fatigue, weakness.  Denies weight gain, weight loss.  EYES:  Denies blurry vision, double vision.  EARS, NOSE, THROAT:  Denies tinnitus, ear pain, hearing loss, complain of nasal drainage.  Had episode of epistaxis before, two months.  RESPIRATORY:  Complains of cough, shortness of breath.  Denies any COPD, any productive sputum and hemoptysis.  CARDIOVASCULAR:  Denies chest pain, edema, arrhythmia, palpitations, syncope.  GASTROINTESTINAL:  Denies nausea, vomiting, diarrhea, abdominal pain, hematemesis, melena.  GENITOURINARY:  Denies dysuria, hematuria, renal colic.  ENDOCRINE:  Denies polyuria, polydipsia, heat or cold intolerance. HEMATOLOGY:  Denies anemia, easy bruising, bleeding diathesis.  INTEGUMENTARY:  Denies acne, rash or skin lesions.  MUSCULOSKELETAL:  Denies any swelling, gout, arthritis.  NEUROLOGIC:  Denies, vertigo, ataxia, headache, CVA, TIA.  PSYCHIATRIC:  Denies anxiety, insomnia, nervousness or schizophrenia.   PHYSICAL EXAMINATION: VITAL SIGNS:  Temperature 98.6, pulse 94, respiratory rate 20, blood pressure 164/57, saturating 95% on 4 liters nasal cannula.  GENERAL:  Obese female, looks comfortable in bed, in no apparent distress.  HEENT:  Head atraumatic, normocephalic.  Pupils equal, reactive to light.  Pink conjunctivae.  Anicteric sclerae.  Dry oral mucosa.  NECK:  Supple.  No thyromegaly.  No JVD.  No lymphadenopathy.  CHEST:  Good air entry bilaterally.  Has significant rales and rhonchi in the left mid and lower lung with no wheezing.  CARDIOVASCULAR:  S1, S2 heard.  No rubs, murmurs or gallops.  ABDOMEN:  Soft, nontender, nondistended.  Bowel sounds  present, obese.  EXTREMITIES:  No edema.  No clubbing.  No cyanosis.  Dorsalis pedis pulse +2 bilaterally.  SKIN:  Normal skin turgor.  Warm and dry.  PSYCHIATRIC:  Appropriate affect.  Awake, alert x 3.  Intact judgment and insight.  NEUROLOGIC:  Cranial nerves grossly intact, motor 5 out of 5.  No focal deficits.  Sensation intact to light touch bilaterally.  LYMPHATICS:  No cervical or axillary or supraclavicular lymphadenopathy.   PERTINENT LABORATORY DATA:  BNP 206, glucose 93, BUN 9, creatinine 0.81, sodium 137, potassium 3.7, chloride 104, CO2 23, total protein 8.2, albumin 2.7, total bili 0.8, alk phos 133, AST 52, ALT 50.  Troponin less than 0.02.  White blood cells 21.6 thousand, hemoglobin 15, hematocrit 44.3, platelet 275.  Chest x-ray showing significant left mid and lower lung infiltrate.   ASSESSMENT AND PLAN: 1.  Healthcare-acquired pneumonia, the patient as well presents with significant hypoxia, the patient had no risk factors for healthcare-acquired pneumonia so she would be started on IV levofloxacin.  Her blood cultures were obtained, as well we will start her on Mucinex and DuoNebs and we will follow on the blood cultures.  We will have her on oxygen to keep her O2 sat around 100.  2.  Hypothyroidism.  We will continue with Synthroid.  3.  Hypertension, uncontrolled.  We will resume her back on metoprolol.  4.  Hypoxia, secondary to her pneumonia.  We will have her on oxygen as needed.  5.  DVT prophylaxis.  Subcutaneous heparin.  6.  CODE STATUS:  The patient is FULL CODE.   Total time spent on admission and patient care 50 minutes.    ____________________________ Albertine Patricia, MD dse:ea D: 09/30/2012 23:59:18 ET T: 10/01/2012 00:57:17 ET JOB#: [id number]  cc: Albertine Patricia, MD, <Dictator> Clarine Elrod Graciela Husbands MD ELECTRONICALLY SIGNED 10/08/2012 12:18

## 2014-06-22 ENCOUNTER — Telehealth: Payer: Self-pay | Admitting: *Deleted

## 2014-06-22 ENCOUNTER — Other Ambulatory Visit: Payer: Managed Care, Other (non HMO)

## 2014-06-22 NOTE — Telephone Encounter (Signed)
Pt came in today and asked what labs she was getting done, i told her there were no orders in but that i could collect the standard tubes and send you a message, she said she would come back Monday.

## 2014-06-25 ENCOUNTER — Encounter: Payer: Self-pay | Admitting: *Deleted

## 2014-06-25 NOTE — Telephone Encounter (Signed)
I had told her that I am not going to pre-order any labs. Will get them at time of appt. thanks

## 2014-06-26 ENCOUNTER — Other Ambulatory Visit: Payer: Managed Care, Other (non HMO)

## 2014-06-27 ENCOUNTER — Encounter: Payer: Self-pay | Admitting: Endocrinology

## 2014-06-27 ENCOUNTER — Ambulatory Visit (INDEPENDENT_AMBULATORY_CARE_PROVIDER_SITE_OTHER): Payer: Managed Care, Other (non HMO) | Admitting: Endocrinology

## 2014-06-27 ENCOUNTER — Other Ambulatory Visit: Payer: Self-pay | Admitting: Endocrinology

## 2014-06-27 VITALS — BP 150/80 | HR 64 | Resp 12 | Ht 65.0 in | Wt 237.4 lb

## 2014-06-27 DIAGNOSIS — E349 Endocrine disorder, unspecified: Secondary | ICD-10-CM | POA: Diagnosis not present

## 2014-06-27 DIAGNOSIS — E042 Nontoxic multinodular goiter: Secondary | ICD-10-CM | POA: Diagnosis not present

## 2014-06-27 DIAGNOSIS — E039 Hypothyroidism, unspecified: Secondary | ICD-10-CM

## 2014-06-27 DIAGNOSIS — R7989 Other specified abnormal findings of blood chemistry: Secondary | ICD-10-CM

## 2014-06-27 DIAGNOSIS — E663 Overweight: Secondary | ICD-10-CM | POA: Diagnosis not present

## 2014-06-27 LAB — TSH: TSH: 0.91 u[IU]/mL (ref 0.35–4.50)

## 2014-06-27 LAB — T4, FREE: FREE T4: 0.71 ng/dL (ref 0.60–1.60)

## 2014-06-27 NOTE — Assessment & Plan Note (Signed)
It is not clear whether her labs at diagnosis were abnormal for TSH but she was symptomatic and by her report, that is why she was started on thyroid hormone. TSH on EMR review around that time have been in normal range.  Will assess her thyroid levels at this visit, and assess whether she can be taken off the levothyroxine all together.   Would not recommend levothyroxine for nodule shrinkage.

## 2014-06-27 NOTE — Patient Instructions (Signed)
Continue current levothyroxine . Thyroid labs today.  Assess whether the dose of levothyroxine could be weaned down further.  Work on diet and exercise.  Please come back for a follow-up appointment in 2 months.

## 2014-06-27 NOTE — Assessment & Plan Note (Signed)
We discussed that she most likely has insulin resistance and PCOS. Diagnosis is difficult as she is perimenopausal age , but given her [place] values -wonder how much of this recent menstrual irregularity is related to PCOS rather than age.  Given low suspicion for ovarian tumor, will plan to repeat US ovary next visit to assess right ovary and left ovary again. Repeat testosterone levels after weight loss.   Discussed treatment options- either trial of metformin versus treatment for hirsutism with Spironolactone versus local creams. She hasn't tolerated metformin in the past. Given her multiple intolerances to medications, she is hesistent to make any changes to her current medications or try any new medications at this time. Vaniqa cream probably too expensive for her at this time.  Discussed healthy lifestyle measures in the interim and encouraged her to follow this. She is agreeable.

## 2014-06-27 NOTE — Assessment & Plan Note (Signed)
Encouraged her to get back to dietary modifications, cut back portion sizes and work on increasing activity.

## 2014-06-27 NOTE — Assessment & Plan Note (Signed)
  Currently, her nodules are sub cm and without mention of any concerning features. No risk factors for thyroid cancer. Last [place] Jan 2016, [facility]   Would recommend continue to follow up with serial thyroid US and repeat US in 1 year ( Jan 2017) to assess nodule stability.  Notify sooner if compressive symptoms worsen or notices a nodule in her neck.

## 2014-06-27 NOTE — Progress Notes (Signed)
Pre visit review using our clinic review tool, if applicable. No additional management support is needed unless otherwise documented below in the visit note. 

## 2014-06-27 NOTE — Progress Notes (Signed)
Patient ID: Alexandria Fry, female   DOB: [birth year], 59 y.o.   MRN: [id number]   Reason for visit: Hypothyroidism, thyroid nodules, hirsutism  HPI  Alexandria Fry is a 59 y.o.-year-old female,  for follow up for hypothyroidism, thyroid nodules, hirsutism.  Hypothyroidism- Patient recalls being diagnosed with hypothyroidism in 2010/2011  following blood work, which by her report was in normal range - but she was started on treatment with levothyroxine based on her symptoms of hypothyroidism. Most recently, she has been on Levothyroxine 25 mcg. Patient has been taking this medication fasting, with water, separate from breakfast > 30 minutes and from pills ( calcium, iron, PPIs, multivitamins)> 4 hours. Denies missing any doses recently. Dose of levothyroxine was decreased at last visit to current dose.  Recently has been having a "black spot " in her vision that goes away after blinking, round to square in shape, happens after she opens her eyes on awakening, in a dimly lit room. Recent visit with opthalmology and workup underway. Notes that the visual changes after she decreased dose of levothyroxine. Wonders if it is related. Also wants to know about parathyroid. Recent calcium normal.   Denies any recent use of iodine supplements or herbal medications.   I reviewed pt's thyroid tests: Lab Results  Component Value Date   TSH 1.23 04/18/2014   TSH 1.11 01/30/2014   TSH 0.82 08/04/2012   TSH 1.16 01/22/2012   TSH 0.73 01/19/2011   TSH 0.66 01/14/2010   TSH 1.41 05/20/2007   TSH 1.24 01/18/2007   FREET4 0.90 04/18/2014   FREET4 0.86 02/06/2014   FREET4 1.00 08/04/2012   T3FREE 2.5 02/06/2014     Review of systems: [ x  ] complains of    [  ] denies [ x ] weight gain-recently stable  [  ] constipation  [ x ] fatigue -more so recently [  ] dry skin  [  ] cold intolerance [  ] hair loss [  ] noticing any enlargement in size of thyroid [  ] lumps in neck [ x ] dysphagia [  ]  change in voice [x  ]  SOB with lying down- sinus trouble.  she has family history of  thyroid disorders in: none. No Family history of thyroid cancer.  No history of XRT to head or neck.   Thyroid nodules-  Due to history of dysphagia- the patient had a thyroid US done Jan 20th, 2016 [facility].  It showed 2 sub cm hypoechoic nodules right thyroid lobe ( 5 x 68mm inferior pole with adjacent 5 x 3 x46mmm, inferior pole) Subtle Isoechoic nodule sub cm mid left lobe (5x3x57mm) No LAD  I have reviewed the report with her at last clinic visit.  Dysphagia is mild since Thanksgiving 2015- occasionally has been choking on her food and feels that her throat closes - able to eat most foods though No other compressive symptoms  Hirsutism and elevated testosterone- She states that she always has been having excess hair growth on her face, chin, now having more on sideburns, occasionally on chest and abdomen- terminal hair- has been shaving abnormal hair Some recent worsening in this. Recent FSH not in menopausal range.  Recent Testosterone elevated- but downtrending.  Apparently, she has had a workup of this in the past.  Prior menses regular q 5 weeks, then started having irreg periods since age 59. 2 D+C since then for heavy intermittent bleeding. LMP Nov 2015 with spotting x 1 day. Then  May 2016 with spotting. Prior  pelvic US was done and negative for tumors. Repeat US Feb 2016 was negative for tumors, and right ovary was not visualized.  Skin tags and acne worsening.  Has been on metformin around 2011. At that time, "there was a lot going on and she was on multiple drugs with sulfa" and she was thought to develop an allergy and taken off metformin. Also, possibly had GI upset with metformin. Had been on high protein, low carb diet 2012, cant follow that anymore- lowest weight 180 lbs- now retired. hasnt been able to keep up with lifestyle efforts but planning on doing so over next few months. No recent  steroids, no abnormal stretch marks No change in shoe size/ring size Hx preDM  Lab Results  Component Value Date   TESTOSTERONE 48* 04/18/2014   Lab Results  Component Value Date   HGBA1C 5.7 03/30/2014   Since last visit, had negative 1 mg dex suppression test, normal PRL, IGF-1 levels as well.  Recd results of transabdominal and transvaginal US done 04/12/14- Uterus -no fibroids, 76mm cyst region of cervix most likely nabothian cyst, 12 mm uterine thickness Right ovary not visualized. Left ovary- 2.1  X 1.3 x1.5cm- no mass/normal appearance/no adnexal mass Reviewed prior record from [facility] from 2011 to 2013 and no ovarian masses were identified.   I have reviewed the patient's past medical history, medications and allergies.    Past Medical History  Diagnosis Date  . Allergic rhinitis   . Hypertension   . Murmur, cardiac   . Hypothyroid   . Irregular menses     with neg endometrial biopsy 2013  . Pneumonia     2014    Current Outpatient Prescriptions on File Prior to Visit  Medication Sig Dispense Refill  . cholecalciferol (VITAMIN D) 1000 UNITS tablet Take 5,000 Units by mouth daily.     Marland Kitchen levothyroxine (SYNTHROID) 50 MCG tablet Take 1 tablet (50 mcg total) by mouth daily. 90 tablet 3  . loteprednol (ALREX) 0.2 % SUSP 1 drop daily as needed.    . metoprolol succinate (TOPROL-XL) 50 MG 24 hr tablet Take 1 by mouth twice a day 180 tablet 3  . Multiple Vitamin (MULTIVITAMIN) tablet Take 1 tablet by mouth daily.     No current facility-administered medications on file prior to visit.   Allergies  Allergen Reactions  . Ace Inhibitors     tachycardia  . Amoxicillin-Pot Clavulanate     REACTION: Swelling  . Ceftin [Cefuroxime Axetil]     Rash  . Diphenhydramine Hcl     REACTION: Burns skin  . Hydrochlorothiazide W-Triamterene     REACTION: Hives and GI upset  . Hydrocodone-Acetaminophen     REACTION: Headache  . Levaquin [Levofloxacin In D5w] Itching    Severe  itching and flushing.  . Metformin And Related     hives  . Nitrofurantoin     REACTION: Swelling, SOB  . Oxycodone-Acetaminophen     REACTION: Headache  . Sulfamethoxazole-Trimethoprim     REACTION: Hives and swollen neck  . Sulfonamide Derivatives     REACTION: Hives, tightness in chest  . Zithromax [Azithromycin] Hives   ROS: as above  PE: BP 150/80 mmHg  Pulse 64  Resp 12  Ht 5\' 5"  (1.651 m)  Wt 237 lb 6.4 oz (107.684 kg)  BMI 39.51 kg/m2  SpO2 95% Wt Readings from Last 3 Encounters:  06/27/14 237 lb 6.4 oz (107.684 kg)  04/18/14 235 lb  4 oz (106.709 kg)  03/21/14 232 lb 12 oz (105.575 kg)    HEENT: [place]/AT, EOMI, no icterus, no proptosis, no chemosis, no mild lid lag, no retraction, eyes close completely Neck: thyroid gland - smooth, non-tender, no erythema, no tracheal deviation; negative Pemberton's sign; no lymphadenopathy; no bruits Lungs: good air entry, clear bilaterally Heart: S1&S2 normal, regular rate & rhythm; no murmurs, rubs or gallops Ext: no tremor in hands bilaterally, no edema, 2+ DP/PT pulses, good muscle mass Neuro: normal gait, 2+ reflexes bilaterally, normal 5/5 strength, no proximal myopathy  Derm: no pretibial myxoedema/skin dryness    ASSESSMENT:   Problem List Items Addressed This Visit      Endocrine   Hypothyroidism - Primary    It is not clear whether her labs at diagnosis were abnormal for TSH but she was symptomatic and by her report, that is why she was started on thyroid hormone. TSH on EMR review around that time have been in normal range.  Will assess her thyroid levels at this visit, and assess whether she can be taken off the levothyroxine all together.   Would not recommend levothyroxine for nodule shrinkage.            Relevant Orders   TSH   T4, free   Non-toxic multinodular goiter     Currently, her nodules are sub cm and without mention of any concerning features. No risk factors for thyroid cancer. Last [place] Jan  2016, [facility].   Would recommend continue to follow up with serial thyroid US and repeat US in 1 year ( Jan 2017) to assess nodule stability.  Notify sooner if compressive symptoms worsen or notices a nodule in her neck.          Relevant Orders   TSH   T4, free     Other   Overweight    Encouraged her to get back to dietary modifications, cut back portion sizes and work on increasing activity.      Elevated testosterone level in female    We discussed that she most likely has insulin resistance and PCOS. Diagnosis is difficult as she is perimenopausal age , but given her [place] values -wonder how much of this recent menstrual irregularity is related to PCOS rather than age.  Given low suspicion for ovarian tumor, will plan to repeat US ovary next visit to assess right ovary and left ovary again. Repeat testosterone levels after weight loss.   Discussed treatment options- either trial of metformin versus treatment for hirsutism with Spironolactone versus local creams. She hasn't tolerated metformin in the past. Given her multiple intolerances to medications, she is hesistent to make any changes to her current medications or try any new medications at this time. Alexandria Fry cream probably too expensive for her at this time.  Discussed healthy lifestyle measures in the interim and encouraged her to follow this. She is agreeable.            RTC 2 months  Licet Dunphy [facility]  06/27/2014   8:58 AM

## 2014-08-01 ENCOUNTER — Telehealth: Payer: Self-pay

## 2014-08-01 DIAGNOSIS — E039 Hypothyroidism, unspecified: Secondary | ICD-10-CM

## 2014-08-01 NOTE — Telephone Encounter (Signed)
Left message at home number to call back. 

## 2014-08-01 NOTE — Telephone Encounter (Signed)
Patient notified as instructed by telephone and verbalized understanding. 

## 2014-08-01 NOTE — Telephone Encounter (Signed)
Pt left v/m; pt has been seeing Dr Howell Rucks and Dr Howell Rucks is moving to [facility] and pt does not want to go to another endocrinologist. Dr Howell Rucks is trying to take pt off levothyroxine; pt is to have thyroid labs on 08/23/14 and pt wants to know if Dr Damita Dunnings will order thyroid labs to be done at [facility] lab when has Vit D checked on 08/14/14 (pt already has that appt). Pt request cb.

## 2014-08-01 NOTE — Telephone Encounter (Signed)
I put in the order.  It would be reasonable to get TSH and vit D done together.   Reasonable for OV a few days after labs to go over plan for all of her concerns and transfer all the endo plans back to [facility].   Thanks.

## 2014-08-01 NOTE — Telephone Encounter (Signed)
I'll have to check the chart and look into this.  I likely won't have an answer today.   Please route this back to me and I'll see what I can do.   Thanks.

## 2014-08-02 NOTE — Telephone Encounter (Signed)
Patient advised. Appointment scheduled.  

## 2014-08-14 ENCOUNTER — Other Ambulatory Visit (INDEPENDENT_AMBULATORY_CARE_PROVIDER_SITE_OTHER): Payer: Managed Care, Other (non HMO)

## 2014-08-14 DIAGNOSIS — E039 Hypothyroidism, unspecified: Secondary | ICD-10-CM

## 2014-08-14 DIAGNOSIS — E559 Vitamin D deficiency, unspecified: Secondary | ICD-10-CM | POA: Diagnosis not present

## 2014-08-14 LAB — VITAMIN D 25 HYDROXY (VIT D DEFICIENCY, FRACTURES): VITD: 15.88 ng/mL — ABNORMAL LOW (ref 30.00–100.00)

## 2014-08-14 LAB — TSH: TSH: 1.15 u[IU]/mL (ref 0.35–4.50)

## 2014-08-14 LAB — T4, FREE: FREE T4: 0.72 ng/dL (ref 0.60–1.60)

## 2014-08-16 ENCOUNTER — Encounter: Payer: Self-pay | Admitting: Family Medicine

## 2014-08-16 ENCOUNTER — Ambulatory Visit (INDEPENDENT_AMBULATORY_CARE_PROVIDER_SITE_OTHER): Payer: Managed Care, Other (non HMO) | Admitting: Family Medicine

## 2014-08-16 VITALS — BP 150/84 | HR 75 | Temp 99.1°F | Wt 235.8 lb

## 2014-08-16 DIAGNOSIS — E042 Nontoxic multinodular goiter: Secondary | ICD-10-CM

## 2014-08-16 DIAGNOSIS — E559 Vitamin D deficiency, unspecified: Secondary | ICD-10-CM | POA: Diagnosis not present

## 2014-08-16 NOTE — Progress Notes (Signed)
Pre visit review using our clinic review tool, if applicable. No additional management support is needed unless otherwise documented below in the visit note.  Will need repeat thyroid u/s in 02/2015.  D/w pt. TFTs wnl off med.  Weight stable.  D/w pt.   Swallowing food okay.  No choking on food or liquids.   Energy level is fair.   She is working on diet and exercise.  Weight d/w pt.    Pap done (normal per patient) per gyn clinic in 04/2014.    Vit D is down.  D/w pt.  Will inc to 4000 units a day.  No ade on med.   Meds, vitals, and allergies reviewed.   ROS: See HPI.  Otherwise, noncontributory.  nad ncat Neck supple, thyroid not ttp, no LA rrr ctab

## 2014-08-16 NOTE — Patient Instructions (Addendum)
Increase to 4000 units of vitamin D a day.   Your thyroid tests were fine.  We'll recheck your ultrasound in ~02/2015.  Schedule a physical for ~02/2015.  Take care.  Glad to see you.

## 2014-08-17 NOTE — Assessment & Plan Note (Signed)
Inc to 4000 units a day, can recheck later on.  She agrees.

## 2014-08-17 NOTE — Assessment & Plan Note (Signed)
Normalized off replacement.  Labs d/w pt.  Stay off med for now.  Will need repeat thyroid u/s in 02/2015.  D/w pt.  She agrees.

## 2014-08-21 ENCOUNTER — Ambulatory Visit: Payer: Managed Care, Other (non HMO) | Admitting: Endocrinology

## 2015-02-18 ENCOUNTER — Other Ambulatory Visit: Payer: Self-pay | Admitting: Family Medicine

## 2015-02-18 DIAGNOSIS — E042 Nontoxic multinodular goiter: Secondary | ICD-10-CM

## 2015-02-18 DIAGNOSIS — E559 Vitamin D deficiency, unspecified: Secondary | ICD-10-CM

## 2015-02-18 DIAGNOSIS — I10 Essential (primary) hypertension: Secondary | ICD-10-CM

## 2015-02-22 ENCOUNTER — Ambulatory Visit
Admission: RE | Admit: 2015-02-22 | Discharge: 2015-02-22 | Disposition: A | Discharge status: Home / Self Care | Payer: Managed Care, Other (non HMO) | Source: Ambulatory Visit | Attending: Family Medicine | Admitting: Family Medicine

## 2015-02-22 DIAGNOSIS — E042 Nontoxic multinodular goiter: Secondary | ICD-10-CM | POA: Insufficient documentation

## 2015-02-25 ENCOUNTER — Other Ambulatory Visit: Payer: Managed Care, Other (non HMO)

## 2015-03-01 ENCOUNTER — Encounter: Payer: Managed Care, Other (non HMO) | Admitting: Family Medicine

## 2015-03-04 ENCOUNTER — Encounter: Payer: Managed Care, Other (non HMO) | Admitting: Family Medicine

## 2015-03-04 ENCOUNTER — Other Ambulatory Visit: Payer: Managed Care, Other (non HMO)

## 2015-03-06 ENCOUNTER — Other Ambulatory Visit (INDEPENDENT_AMBULATORY_CARE_PROVIDER_SITE_OTHER): Payer: Managed Care, Other (non HMO)

## 2015-03-06 DIAGNOSIS — E042 Nontoxic multinodular goiter: Secondary | ICD-10-CM | POA: Diagnosis not present

## 2015-03-06 DIAGNOSIS — I10 Essential (primary) hypertension: Secondary | ICD-10-CM | POA: Diagnosis not present

## 2015-03-06 DIAGNOSIS — R7989 Other specified abnormal findings of blood chemistry: Secondary | ICD-10-CM

## 2015-03-06 DIAGNOSIS — E559 Vitamin D deficiency, unspecified: Secondary | ICD-10-CM

## 2015-03-06 LAB — COMPREHENSIVE METABOLIC PANEL
ALBUMIN: 3.9 g/dL (ref 3.5–5.2)
ALT: 36 U/L — AB (ref 0–35)
AST: 37 U/L (ref 0–37)
Alkaline Phosphatase: 56 U/L (ref 39–117)
BILIRUBIN TOTAL: 0.5 mg/dL (ref 0.2–1.2)
BUN: 11 mg/dL (ref 6–23)
CALCIUM: 9.5 mg/dL (ref 8.4–10.5)
CHLORIDE: 102 meq/L (ref 96–112)
CO2: 28 mEq/L (ref 19–32)
CREATININE: 0.76 mg/dL (ref 0.40–1.20)
GFR: 82.84 mL/min (ref >60.00)
Glucose, Bld: 131 mg/dL — ABNORMAL HIGH (ref 70–99)
Potassium: 4.1 mEq/L (ref 3.5–5.1)
Sodium: 137 mEq/L (ref 135–145)
Total Protein: 7.3 g/dL (ref 6.0–8.3)

## 2015-03-06 LAB — LIPID PANEL
CHOLESTEROL: 203 mg/dL — AB (ref 0–200)
HDL: 40.6 mg/dL (ref >39.00)
NonHDL: 162.24
TRIGLYCERIDES: 255 mg/dL — AB (ref 0.0–149.0)
Total CHOL/HDL Ratio: 5
VLDL: 51 mg/dL — ABNORMAL HIGH (ref 0.0–40.0)

## 2015-03-06 LAB — TSH: TSH: 1.31 u[IU]/mL (ref 0.35–4.50)

## 2015-03-06 LAB — LDL CHOLESTEROL, DIRECT: LDL DIRECT: 98 mg/dL

## 2015-03-06 LAB — VITAMIN D 25 HYDROXY (VIT D DEFICIENCY, FRACTURES): VITD: 21.53 ng/mL — ABNORMAL LOW (ref 30.00–100.00)

## 2015-03-08 ENCOUNTER — Encounter: Payer: Self-pay | Admitting: Family Medicine

## 2015-03-08 ENCOUNTER — Ambulatory Visit (INDEPENDENT_AMBULATORY_CARE_PROVIDER_SITE_OTHER): Payer: Managed Care, Other (non HMO) | Admitting: Family Medicine

## 2015-03-08 ENCOUNTER — Other Ambulatory Visit: Payer: Self-pay | Admitting: Family Medicine

## 2015-03-08 VITALS — BP 166/90 | HR 77 | Temp 98.8°F | Ht 65.0 in | Wt 240.5 lb

## 2015-03-08 DIAGNOSIS — E042 Nontoxic multinodular goiter: Secondary | ICD-10-CM

## 2015-03-08 DIAGNOSIS — J3489 Other specified disorders of nose and nasal sinuses: Secondary | ICD-10-CM

## 2015-03-08 DIAGNOSIS — E663 Overweight: Secondary | ICD-10-CM

## 2015-03-08 DIAGNOSIS — E559 Vitamin D deficiency, unspecified: Secondary | ICD-10-CM

## 2015-03-08 DIAGNOSIS — R739 Hyperglycemia, unspecified: Secondary | ICD-10-CM | POA: Diagnosis not present

## 2015-03-08 DIAGNOSIS — Z119 Encounter for screening for infectious and parasitic diseases, unspecified: Secondary | ICD-10-CM

## 2015-03-08 DIAGNOSIS — I10 Essential (primary) hypertension: Secondary | ICD-10-CM

## 2015-03-08 DIAGNOSIS — Z0000 Encounter for general adult medical examination without abnormal findings: Secondary | ICD-10-CM

## 2015-03-08 LAB — HEMOGLOBIN A1C: Hgb A1c MFr Bld: 6.1 % (ref 4.6–6.5)

## 2015-03-08 MED ORDER — GENTAMICIN SULFATE 0.1 % EX OINT: TOPICAL_OINTMENT | Freq: Every day | CUTANEOUS | Status: DC | PRN

## 2015-03-08 MED ORDER — METOPROLOL SUCCINATE ER 50 MG PO TB24: ORAL_TABLET | ORAL | Status: DC

## 2015-03-08 NOTE — Patient Instructions (Addendum)
You can call for a mammogram at: [facility].  [place] A1c in about 3-4 months.   Recheck BP in about 1-2 weeks and if still >140/>90 then let me know.  Keep working on your weight.   Take care.  Glad to see you.

## 2015-03-08 NOTE — Assessment & Plan Note (Signed)
D/w pt about diet and exercise.   

## 2015-03-08 NOTE — Assessment & Plan Note (Signed)
Had used gent nasally over the years after rec from ENT.  Used rarely.  rx done.  F/u prn.  No sx now.

## 2015-03-08 NOTE — Assessment & Plan Note (Signed)
TSH wnl. D/w pt. U/s w/o sig change. dw pt. She has no need/indication for bx at this point. Reasonable to repeat TSH and u/s in about 1 year.

## 2015-03-08 NOTE — Progress Notes (Signed)
Pre visit review using our clinic review tool, if applicable. No additional management support is needed unless otherwise documented below in the visit note.  CPE- See plan.  Routine anticipatory guidance given to patient.  See health maintenance. Tetanus 2011 Flu 2016 Shingles and PNA not due now. PNA prev done 2014 Pap per gyn, she'll f/u.  DXA not due Mammogram done 2015.   She'll call about f/u.   Living will d/w pt. Husband designated if patient were incapacitated.  Diet and exercise d/w pt. She is trying to get more exercise, recently working more to get more exercise.    Hypertension:    Using medication without problems or lightheadedness: yes Chest pain with exertion:no Edema: occ BLE edema but that is limited and inconsistent.   Short of breath:no Average home BPs: occ elevated with inc in salt intake.  Usually controlled o/w.    Thyroid nodules.  TSH wnl.  D/w pt.  U/s w/o sig change.   dw pt.  She has no need/indication for bx at this point.  Reasonable to repeat TSH and u/s in about 1 year.    PMH and SH reviewed  Meds, vitals, and allergies reviewed.   ROS: See HPI.  Otherwise negative.    GEN: nad, alert and oriented HEENT: mucous membranes moist NECK: supple w/o LA CV: rrr. PULM: ctab, no inc wob ABD: soft, +bs EXT: no edema SKIN: no acute rash

## 2015-03-08 NOTE — Assessment & Plan Note (Signed)
BP has been okay out of clinic.  She'll check out of clinic again and update me if not controlled.  Needs work on weight loss.  She agrees.

## 2015-03-08 NOTE — Addendum Note (Signed)
Addended by: Marchia Bond on: 03/08/2015 04:24 PM   Modules accepted: Miquel Dunn

## 2015-03-08 NOTE — Assessment & Plan Note (Signed)
Some improvement, continue repletion.  D/w pt.

## 2015-03-08 NOTE — Assessment & Plan Note (Signed)
Tetanus 2011 Flu 2016 Shingles and PNA not due now. PNA prev done 2014 Pap per gyn, she'll f/u.  DXA not due Mammogram done 2015.   She'll call about f/u.   Living will d/w pt. Husband designated if patient were incapacitated.  Diet and exercise d/w pt. She is trying to get more exercise, recently working more to get more exercise.   HIV and HCV pending with next set of labs, d/w pt about screening.

## 2015-03-09 ENCOUNTER — Other Ambulatory Visit: Payer: Self-pay | Admitting: Family Medicine

## 2015-03-09 DIAGNOSIS — R739 Hyperglycemia, unspecified: Secondary | ICD-10-CM

## 2015-03-09 LAB — HIV ANTIBODY (ROUTINE TESTING W REFLEX): HIV 1&2 Ab, 4th Generation: NONREACTIVE

## 2015-03-09 LAB — HEPATITIS C ANTIBODY: HCV AB: NEGATIVE

## 2015-03-18 ENCOUNTER — Encounter
Admission: RE | Admit: 2015-03-18 | Discharge: 2015-03-18 | Disposition: A | Discharge status: Home / Self Care | Payer: Managed Care, Other (non HMO) | Source: Ambulatory Visit | Attending: Obstetrics & Gynecology | Admitting: Obstetrics & Gynecology

## 2015-03-18 ENCOUNTER — Ambulatory Visit
Admission: RE | Admit: 2015-03-18 | Discharge: 2015-03-18 | Disposition: A | Discharge status: Home / Self Care | Payer: Managed Care, Other (non HMO) | Source: Ambulatory Visit | Attending: Obstetrics & Gynecology | Admitting: Obstetrics & Gynecology

## 2015-03-18 DIAGNOSIS — Z01812 Encounter for preprocedural laboratory examination: Secondary | ICD-10-CM | POA: Diagnosis present

## 2015-03-18 DIAGNOSIS — Z01810 Encounter for preprocedural cardiovascular examination: Secondary | ICD-10-CM | POA: Diagnosis not present

## 2015-03-18 DIAGNOSIS — I10 Essential (primary) hypertension: Secondary | ICD-10-CM

## 2015-03-18 HISTORY — DX: Gastro-esophageal reflux disease without esophagitis: K21.9

## 2015-03-18 LAB — TYPE AND SCREEN
ABO/RH(D): A POS
Antibody Screen: NEGATIVE

## 2015-03-18 LAB — ABO/RH: ABO/RH(D): A POS

## 2015-03-18 NOTE — Patient Instructions (Signed)
  Your procedure is scheduled on: March 21, 2015 (Thursday) Report to Day Surgery.[facility]) Second Floor To find out your arrival time please call [phone] between 1PM - 3PM on March 20, 2015 (Wednesday).  Remember: Instructions that are not followed completely may result in serious medical risk, up to and including death, or upon the discretion of your surgeon and anesthesiologist your surgery may need to be rescheduled.    __x__ 1. Do not eat food or drink liquids after midnight. No gum chewing or hard candies.     __x__ 2. No Alcohol for 24 hours before or after surgery.   ____ 3. Bring all medications with you on the day of surgery if instructed.    __x__ 4. Notify your doctor if there is any change in your medical condition     (cold, fever, infections).     Do not wear jewelry, make-up, hairpins, clips or nail polish.  Do not wear lotions, powders, or perfumes. You may wear deodorant.  Do not shave 48 hours prior to surgery. Men may shave face and neck.  Do not bring valuables to the hospital.    [facility] is not responsible for any belongings or valuables.               Contacts, dentures or bridgework may not be worn into surgery.  Leave your suitcase in the car. After surgery it may be brought to your room.  For patients admitted to the hospital, discharge time is determined by your                treatment team.   Patients discharged the day of surgery will not be allowed to drive home.   Please read over the following fact sheets that you were given:   Surgical Site Infection Prevention   ____ Take these medicines the morning of surgery with A SIP OF WATER:    1. Metoprolol  2.   3.   4.  5.  6.  ____ Fleet Enema (as directed)   ____ Use CHG Soap as directed  ____ Use inhalers on the day of surgery  ____ Stop metformin 2 days prior to surgery    ____ Take 1/2 of usual insulin dose the night before surgery and none on the morning of surgery.    ____ Stop Coumadin/Plavix/aspirin on   __x_ Stop Anti-inflammatories on (NO NSAIDS) Tylenol  ok to take for pain if needed   ____ Stop supplements until after surgery.    ____ Bring C-Pap to the hospital.

## 2015-03-20 ENCOUNTER — Other Ambulatory Visit: Payer: Self-pay | Admitting: Family Medicine

## 2015-03-21 ENCOUNTER — Encounter
Admission: RE | Disposition: A | Discharge status: Home / Self Care | Payer: Self-pay | Source: Ambulatory Visit | Attending: Obstetrics & Gynecology

## 2015-03-21 ENCOUNTER — Ambulatory Visit: Payer: Managed Care, Other (non HMO) | Admitting: Registered Nurse

## 2015-03-21 ENCOUNTER — Ambulatory Visit
Admission: RE | Admit: 2015-03-21 | Discharge: 2015-03-21 | Disposition: A | Discharge status: Home / Self Care | Payer: Managed Care, Other (non HMO) | Source: Ambulatory Visit | Attending: Obstetrics & Gynecology | Admitting: Obstetrics & Gynecology

## 2015-03-21 ENCOUNTER — Encounter: Payer: Self-pay | Admitting: *Deleted

## 2015-03-21 DIAGNOSIS — N950 Postmenopausal bleeding: Secondary | ICD-10-CM | POA: Insufficient documentation

## 2015-03-21 DIAGNOSIS — Z803 Family history of malignant neoplasm of breast: Secondary | ICD-10-CM | POA: Insufficient documentation

## 2015-03-21 DIAGNOSIS — Z881 Allergy status to other antibiotic agents status: Secondary | ICD-10-CM | POA: Insufficient documentation

## 2015-03-21 DIAGNOSIS — Z9889 Other specified postprocedural states: Secondary | ICD-10-CM | POA: Insufficient documentation

## 2015-03-21 DIAGNOSIS — Z8249 Family history of ischemic heart disease and other diseases of the circulatory system: Secondary | ICD-10-CM | POA: Insufficient documentation

## 2015-03-21 DIAGNOSIS — N858 Other specified noninflammatory disorders of uterus: Secondary | ICD-10-CM | POA: Insufficient documentation

## 2015-03-21 DIAGNOSIS — K219 Gastro-esophageal reflux disease without esophagitis: Secondary | ICD-10-CM | POA: Diagnosis not present

## 2015-03-21 DIAGNOSIS — E039 Hypothyroidism, unspecified: Secondary | ICD-10-CM | POA: Diagnosis not present

## 2015-03-21 DIAGNOSIS — Z880 Allergy status to penicillin: Secondary | ICD-10-CM | POA: Diagnosis not present

## 2015-03-21 DIAGNOSIS — Z800 Family history of malignant neoplasm of digestive organs: Secondary | ICD-10-CM | POA: Insufficient documentation

## 2015-03-21 DIAGNOSIS — Z885 Allergy status to narcotic agent status: Secondary | ICD-10-CM | POA: Insufficient documentation

## 2015-03-21 DIAGNOSIS — I10 Essential (primary) hypertension: Secondary | ICD-10-CM | POA: Diagnosis not present

## 2015-03-21 DIAGNOSIS — Z882 Allergy status to sulfonamides status: Secondary | ICD-10-CM | POA: Diagnosis not present

## 2015-03-21 DIAGNOSIS — Z8349 Family history of other endocrine, nutritional and metabolic diseases: Secondary | ICD-10-CM | POA: Diagnosis not present

## 2015-03-21 DIAGNOSIS — Z79899 Other long term (current) drug therapy: Secondary | ICD-10-CM | POA: Insufficient documentation

## 2015-03-21 DIAGNOSIS — Z888 Allergy status to other drugs, medicaments and biological substances status: Secondary | ICD-10-CM | POA: Diagnosis not present

## 2015-03-21 DIAGNOSIS — Z8701 Personal history of pneumonia (recurrent): Secondary | ICD-10-CM | POA: Insufficient documentation

## 2015-03-21 DIAGNOSIS — N840 Polyp of corpus uteri: Secondary | ICD-10-CM | POA: Diagnosis not present

## 2015-03-21 DIAGNOSIS — N8500 Endometrial hyperplasia, unspecified: Secondary | ICD-10-CM | POA: Diagnosis not present

## 2015-03-21 HISTORY — PX: DILATATION & CURETTAGE/HYSTEROSCOPY WITH MYOSURE: SHX6511

## 2015-03-21 SURGERY — DILATATION & CURETTAGE/HYSTEROSCOPY WITH MYOSURE
Anesthesia: General | Wound class: Clean Contaminated

## 2015-03-21 MED ORDER — ACETAMINOPHEN 325 MG PO TABS: 650.0000 mg | ORAL_TABLET | ORAL | Status: DC | PRN

## 2015-03-21 MED ORDER — ONDANSETRON HCL 4 MG/2ML IJ SOLN
4.0000 mg | Freq: Once | INTRAMUSCULAR | Status: AC | PRN
  Administered 2015-03-21: 4 mg via INTRAVENOUS

## 2015-03-21 MED ORDER — SUCCINYLCHOLINE CHLORIDE 20 MG/ML IJ SOLN
INTRAMUSCULAR | Status: DC | PRN
  Administered 2015-03-21: 100 mg via INTRAVENOUS

## 2015-03-21 MED ORDER — MORPHINE SULFATE (PF) 2 MG/ML IV SOLN: 1.0000 mg | INTRAVENOUS | Status: DC | PRN

## 2015-03-21 MED ORDER — MIDAZOLAM HCL 2 MG/2ML IJ SOLN
INTRAMUSCULAR | Status: DC | PRN
  Administered 2015-03-21: 2 mg via INTRAVENOUS

## 2015-03-21 MED ORDER — ACETAMINOPHEN 650 MG RE SUPP
650.0000 mg | RECTAL | Status: DC | PRN
  Filled 2015-03-21: qty 1

## 2015-03-21 MED ORDER — KETOROLAC TROMETHAMINE 30 MG/ML IJ SOLN
30.0000 mg | Freq: Four times a day (QID) | INTRAMUSCULAR | Status: DC
  Filled 2015-03-21 (×5): qty 1

## 2015-03-21 MED ORDER — PROPOFOL 10 MG/ML IV BOLUS
INTRAVENOUS | Status: DC | PRN
  Administered 2015-03-21: 200 mg via INTRAVENOUS

## 2015-03-21 MED ORDER — ONDANSETRON HCL 4 MG/2ML IJ SOLN
INTRAMUSCULAR | Status: AC
  Filled 2015-03-21: qty 2

## 2015-03-21 MED ORDER — LACTATED RINGERS IV SOLN
INTRAVENOUS | Status: DC
  Administered 2015-03-21: 13:00:00 via INTRAVENOUS
  Administered 2015-03-21: 75 mL via INTRAVENOUS

## 2015-03-21 MED ORDER — FAMOTIDINE 20 MG PO TABS
ORAL_TABLET | ORAL | Status: AC
  Administered 2015-03-21: 20 mg via ORAL
  Filled 2015-03-21: qty 1

## 2015-03-21 MED ORDER — GLYCOPYRROLATE 0.2 MG/ML IJ SOLN
INTRAMUSCULAR | Status: DC | PRN
  Administered 2015-03-21: 0.2 mg via INTRAVENOUS

## 2015-03-21 MED ORDER — FENTANYL CITRATE (PF) 100 MCG/2ML IJ SOLN
INTRAMUSCULAR | Status: AC
  Filled 2015-03-21: qty 2

## 2015-03-21 MED ORDER — LIDOCAINE HCL (CARDIAC) 20 MG/ML IV SOLN
INTRAVENOUS | Status: DC | PRN
  Administered 2015-03-21: 100 mg via INTRAVENOUS

## 2015-03-21 MED ORDER — IBUPROFEN 600 MG PO TABS: 600.0000 mg | ORAL_TABLET | Freq: Four times a day (QID) | ORAL | Status: DC | PRN

## 2015-03-21 MED ORDER — FENTANYL CITRATE (PF) 100 MCG/2ML IJ SOLN
25.0000 ug | INTRAMUSCULAR | Status: AC | PRN
  Administered 2015-03-21 (×6): 25 ug via INTRAVENOUS

## 2015-03-21 MED ORDER — LIDOCAINE HCL (PF) 1 % IJ SOLN
INTRAMUSCULAR | Status: AC
  Filled 2015-03-21: qty 30

## 2015-03-21 MED ORDER — DEXAMETHASONE SODIUM PHOSPHATE 10 MG/ML IJ SOLN
INTRAMUSCULAR | Status: DC | PRN
  Administered 2015-03-21: 5 mg via INTRAVENOUS

## 2015-03-21 MED ORDER — FENTANYL CITRATE (PF) 100 MCG/2ML IJ SOLN
INTRAMUSCULAR | Status: DC | PRN
  Administered 2015-03-21: 100 ug via INTRAVENOUS

## 2015-03-21 MED ORDER — FAMOTIDINE 20 MG PO TABS
20.0000 mg | ORAL_TABLET | Freq: Once | ORAL | Status: AC
  Administered 2015-03-21: 20 mg via ORAL

## 2015-03-21 SURGICAL SUPPLY — 21 items
ABLATOR ENDOMETRIAL MYOSURE (ABLATOR) IMPLANT
CANISTER SUC SOCK COL 7IN (MISCELLANEOUS) ×3 IMPLANT
CATH ROBINSON RED A/P 16FR (CATHETERS) ×3 IMPLANT
ELECT REM PT RETURN 9FT ADLT (ELECTROSURGICAL)
ELECTRODE REM PT RTRN 9FT ADLT (ELECTROSURGICAL) ×1 IMPLANT
GLOVE SURG SYN 6.5 ES PF (GLOVE) ×6 IMPLANT
GLOVE SURG SYN 6.5 PF PI (GLOVE) ×2 IMPLANT
GOWN STRL REUS W/ TWL LRG LVL3 (GOWN DISPOSABLE) ×2 IMPLANT
GOWN STRL REUS W/TWL LRG LVL3 (GOWN DISPOSABLE) ×6
KIT RM TURNOVER CYSTO AR (KITS) ×3 IMPLANT
MYOSURE LITE POLYP REMOVAL (MISCELLANEOUS) IMPLANT
PACK DNC HYST (MISCELLANEOUS) ×3 IMPLANT
PAD OB MATERNITY 4.3X12.25 (PERSONAL CARE ITEMS) ×3 IMPLANT
PAD PREP 24X41 OB/GYN DISP (PERSONAL CARE ITEMS) ×3 IMPLANT
SEAL ROD LENS SCOPE MYOSURE (ABLATOR) ×3 IMPLANT
SOL .9 NS 3000ML IRR  AL (IV SOLUTION) ×2
SOL .9 NS 3000ML IRR AL (IV SOLUTION) ×1
SOL .9 NS 3000ML IRR UROMATIC (IV SOLUTION) ×1 IMPLANT
TUBING CONNECTING 10 (TUBING) ×2 IMPLANT
TUBING CONNECTING 10' (TUBING) ×1
TUBING HYSTEROSCOPY DOLPHIN (MISCELLANEOUS) ×3 IMPLANT

## 2015-03-21 NOTE — Anesthesia Preprocedure Evaluation (Signed)
Anesthesia Evaluation  Patient identified by MRN, date of birth, ID band Patient awake    Reviewed: Allergy & Precautions, NPO status , Patient's Chart, lab work & pertinent test results  Airway Mallampati: II       Dental  (+) Teeth Intact   Pulmonary former smoker,     + decreased breath sounds      Cardiovascular Exercise Tolerance: Poor hypertension, Pt. on home beta blockers  Rhythm:Regular     Neuro/Psych    GI/Hepatic Neg liver ROS, GERD  ,  Endo/Other  Hypothyroidism Morbid obesity  Renal/GU      Musculoskeletal   Abdominal (+) + obese,   Peds  Hematology negative hematology ROS (+)   Anesthesia Other Findings   Reproductive/Obstetrics                             Anesthesia Physical Anesthesia Plan  ASA: III  Anesthesia Plan: General   Post-op Pain Management:    Induction: Intravenous  Airway Management Planned: Oral ETT  Additional Equipment:   Intra-op Plan:   Post-operative Plan: Extubation in OR  Informed Consent: I have reviewed the patients History and Physical, chart, labs and discussed the procedure including the risks, benefits and alternatives for the proposed anesthesia with the patient or authorized representative who has indicated his/her understanding and acceptance.     Plan Discussed with: CRNA  Anesthesia Plan Comments:         Anesthesia Quick Evaluation

## 2015-03-21 NOTE — Op Note (Signed)
Operative Report Hysteroscopy, Dilation and Curettage 03/21/2015  Patient:  Alexandria Fry  60 y.o. female Preoperative diagnosis: Postmenopausal bleeding, ENDOMETRIAL POLYPS Postoperative diagnosis:  ENDOMETRIAL POLYP  PROCEDURE:  Procedure(s): DILATATION & CURETTAGE/HYSTEROSCOPY, POLYPECTOMY (N/A) Surgeon:  Surgeon(s) and Role:    * Chelsea Loletha Grayer Ward, MD - Primary Assistant: n/a Anesthesia:  LMA I/O: Total I/O In: 700 [I.V.:700] Out: -  minimal blood loss, no UOP collected Specimens:  Endometrial curettings, endometrial polyp Complications: None Apparent Disposition:  VS stable to PACU  Findings: Uterus, mobile, normal size, sounding to 8.5 cm; normal cervix, vagina, perineum.   Indication for procedure/Consents: 60 y.o.  here for scheduled surgery for post menopausal bleeding and evidence of an endometrial polyp. Risks of surgery were discussed with the patient including but not limited to: bleeding which may require transfusion; infection which may require antibiotics; injury to uterus or surrounding organs; intrauterine scarring which may impair future fertility; need for additional procedures including laparotomy or laparoscopy; and other postoperative/anesthesia complications. Written informed consent was obtained.    Procedure Details:   The patient was then taken to the operating room where anesthesia was administered.  After a formal and adequate timeout was performed, she was placed in the dorsal lithotomy position and examined with the above findings. She was then prepped and draped in the sterile manner.  A speculum was then placed in the patient's vagina and a single tooth tenaculum was applied to the anterior lip of the cervix.  The uterus was sounded to 8.5cm. Her cervix was serially dilated to accommodate the myoscope, with findings as above. A polyp forceps was placed in the uterus and applied to the polyp for removal.  Sharp curettage was then performed until there  was a gritty texture in all four quadrants. The specimen(s) was handed off to nursing.  The camera was reinserted and confirmed the uterus had been evacuated. The tenaculum was removed from the anterior lip of the cervix and the vaginal speculum was removed after noting good hemostasis. The patient tolerated the procedure well and was taken to the recovery area awake, extubated and in stable condition.  The patient will be discharged to home as per PACU criteria.  Routine postoperative instructions given. She will follow up in the clinic in two to four weeks for postoperative evaluation.  Larey Days, MD [facility] Attending Gynecologist

## 2015-03-21 NOTE — OR Nursing (Signed)
Nauseated but states she does not want more medication due to so many allergies.  Agreed to take Toradol for pain since it is similar to ibuprofen which she can tolerate. Fluid bolus started for nausea

## 2015-03-21 NOTE — Transfer of Care (Signed)
Immediate Anesthesia Transfer of Care Note  Patient: Alexandria Fry  Procedure(s) Performed: Procedure(s): DILATATION & CURETTAGE/HYSTEROSCOPY, POLYPECTOMY (N/A)  Patient Location: PACU  Anesthesia Type:General  Level of Consciousness: sedated  Airway & Oxygen Therapy: Patient Spontanous Breathing and Patient connected to face mask oxygen  Post-op Assessment: Report given to RN and Post -op Vital signs reviewed and stable  Post vital signs: Reviewed and stable  Last Vitals:  Filed Vitals:   03/21/15 1252 03/21/15 1510  BP: 164/80 175/85  Pulse: 72 85  Temp: 37 C 36.6 C  Resp: 16 19    Complications: No apparent anesthesia complications

## 2015-03-21 NOTE — Anesthesia Procedure Notes (Signed)
Procedure Name: LMA Insertion Date/Time: 03/21/2015 2:39 PM Performed by: Doreen Salvage Pre-anesthesia Checklist: Patient identified, Patient being monitored, Timeout performed, Emergency Drugs available and Suction available Patient Re-evaluated:Patient Re-evaluated prior to inductionOxygen Delivery Method: Circle system utilized Preoxygenation: Pre-oxygenation with 100% oxygen Intubation Type: IV induction Ventilation: Mask ventilation without difficulty LMA: LMA inserted LMA Size: 3.5 Laryngoscope Size: Mac, 3, McGraph and 4 Tube type: Oral Number of attempts: 3 Placement Confirmation: positive ETCO2 and breath sounds checked- equal and bilateral Tube secured with: Tape Dental Injury: Teeth and Oropharynx as per pre-operative assessment  Difficulty Due To: Difficulty was unanticipated and Difficult Airway- due to reduced neck mobility Future Recommendations: Recommend- induction with short-acting agent, and alternative techniques readily available Comments: Mac 3 utilized for first DL- Grade 4 view. Mac 4 Mcgrath utilized for 2nd DL- grade 4 view. Able to mask patient with an oral airway. LMA inserted after 2nd failed DL. Patient has very limited neck mobility and small  mouth opening.

## 2015-03-21 NOTE — Anesthesia Postprocedure Evaluation (Signed)
Anesthesia Post Note  Patient: Alexandria Fry  Procedure(s) Performed: Procedure(s) (LRB): DILATATION & CURETTAGE/HYSTEROSCOPY, POLYPECTOMY (N/A)  Patient location during evaluation: PACU Anesthesia Type: General Level of consciousness: awake Pain management: satisfactory to patient Vital Signs Assessment: post-procedure vital signs reviewed and stable Respiratory status: spontaneous breathing Cardiovascular status: stable Anesthetic complications: no    Last Vitals:  Filed Vitals:   03/21/15 1252 03/21/15 1510  BP: 164/80 175/85  Pulse: 72 85  Temp: 37 C 36.6 C  Resp: 16 19    Last Pain: There were no vitals filed for this visit.               VAN STAVEREN,Josephene Marrone

## 2015-03-21 NOTE — Discharge Instructions (Signed)
You should expect to have some cramping and vaginal bleeding for about a week. This should taper off and subside, much like a period. If heavy bleeding continues or gets worse, you should contact the office for an earlier appointment.  ° °Please call the office or physician on call for fever >101, severe pain, and heavy bleeding.   [phone] ° ° °NOTHING IN THE VAGINA FOR 2 WEEKS! ° °AMBULATORY SURGERY  °DISCHARGE INSTRUCTIONS ° ° °1) The drugs that you were given will stay in your system until tomorrow so for the next 24 hours you should not: ° °A) Drive an automobile °B) Make any legal decisions °C) Drink any alcoholic beverage ° ° °2) You may resume regular meals tomorrow.  Today it is better to start with liquids and gradually work up to solid foods. ° °You may eat anything you prefer, but it is better to start with liquids, then soup and crackers, and gradually work up to solid foods. ° ° °3) Please notify your doctor immediately if you have any unusual bleeding, trouble breathing, redness and pain at the surgery site, drainage, fever, or pain not relieved by medication. ° ° ° °4) Additional Instructions: ° °Please contact your physician with any problems or Same Day Surgery at [phone], Monday through Friday 6 am to 4 pm, or [place] [facility] [place] Main number at [phone]. ° °

## 2015-03-21 NOTE — H&P (Signed)
H&P Update  PLEASE SEE PAPER H&P  Pt was last seen in my office, and complete history and physical performed.  The surgical history has been reviewed and remains accurate without interval change. The patient was re-examined and patient's physiologic condition has not changed significantly in the last 30 days.  No new pharmacological allergies or types of therapy has been initiated.  Allergies  Allergen Reactions  . Ace Inhibitors     tachycardia  . Amoxicillin-Pot Clavulanate     REACTION: Swelling  . Ceftin [Cefuroxime Axetil]     Rash  . Diphenhydramine Hcl     REACTION: Burns skin  . Hydrochlorothiazide W-Triamterene     REACTION: Hives and GI upset  . Hydrocodone-Acetaminophen     REACTION: Headache  . Levaquin [Levofloxacin In D5w] Itching    Severe itching and flushing.  . Metformin And Related     hives  . Nitrofurantoin     REACTION: Swelling, SOB  . Oxycodone-Acetaminophen     REACTION: Headache  . Sulfamethoxazole-Trimethoprim     REACTION: Hives and swollen neck  . Sulfonamide Derivatives     REACTION: Hives, tightness in chest  . Zithromax [Azithromycin] Hives    Past Medical History  Diagnosis Date  . Allergic rhinitis   . Hypertension   . Murmur, cardiac   . Hypothyroid   . Irregular menses     with neg endometrial biopsy 2013  . Pneumonia     2014  . GERD (gastroesophageal reflux disease)    Past Surgical History  Procedure Laterality Date  . Vaginal delivery      NSVD x 2  . Dilation and curettage of uterus  1981    [facility]   . Dilation and curettage of uterus  06/25/2009    normal (Dr. Laurey Morale)    BP 164/80 mmHg  Pulse 72  Temp(Src) 98.6 F (37 C) (Oral)  Resp 16  Ht 5\' 5"  (1.651 m)  Wt 109.77 kg (242 lb)  BMI 40.27 kg/m2  SpO2 96%  NAD RRR no murmurs CTAB, no wheezing, resps unlabored +BS, soft, NTTP No c/c/e Pelvic exam deferred  The above history was confirmed with the patient. The condition still exists that makes this  procedure necessary. Surgical plan includes HYSTEROSCOPY, DILATION AND CURETTAGE as confirmed on the consent. The treatment plan remains the same, without new options for care.  The patient understands the potential benefits and risks and the consents have been signed and placed on the chart.     Larey Days, MD Attending Obstetrician Gynecologist [facility]

## 2015-03-22 ENCOUNTER — Encounter: Payer: Self-pay | Admitting: Obstetrics & Gynecology

## 2015-03-26 LAB — SURGICAL PATHOLOGY

## 2015-06-26 ENCOUNTER — Encounter: Payer: Self-pay | Admitting: Family Medicine

## 2015-07-08 ENCOUNTER — Other Ambulatory Visit: Payer: Managed Care, Other (non HMO)

## 2015-07-29 DIAGNOSIS — H1859 Other hereditary corneal dystrophies: Secondary | ICD-10-CM

## 2015-07-29 DIAGNOSIS — H18529 Epithelial (juvenile) corneal dystrophy, unspecified eye: Secondary | ICD-10-CM | POA: Insufficient documentation

## 2015-10-08 ENCOUNTER — Other Ambulatory Visit: Payer: Managed Care, Other (non HMO)

## 2015-10-15 ENCOUNTER — Other Ambulatory Visit (INDEPENDENT_AMBULATORY_CARE_PROVIDER_SITE_OTHER): Payer: Managed Care, Other (non HMO)

## 2015-10-15 DIAGNOSIS — R739 Hyperglycemia, unspecified: Secondary | ICD-10-CM | POA: Diagnosis not present

## 2015-10-15 LAB — HEMOGLOBIN A1C: HEMOGLOBIN A1C: 8.3 % — AB (ref 4.6–6.5)

## 2015-10-18 ENCOUNTER — Ambulatory Visit (INDEPENDENT_AMBULATORY_CARE_PROVIDER_SITE_OTHER): Payer: Managed Care, Other (non HMO) | Admitting: Family Medicine

## 2015-10-18 ENCOUNTER — Encounter: Payer: Self-pay | Admitting: Family Medicine

## 2015-10-18 DIAGNOSIS — I10 Essential (primary) hypertension: Secondary | ICD-10-CM

## 2015-10-18 DIAGNOSIS — E119 Type 2 diabetes mellitus without complications: Secondary | ICD-10-CM | POA: Diagnosis not present

## 2015-10-18 MED ORDER — AMLODIPINE BESYLATE 10 MG PO TABS: 5.0000 mg | ORAL_TABLET | Freq: Every day | ORAL | 3 refills | Status: DC

## 2015-10-18 NOTE — Assessment & Plan Note (Addendum)
rx for meter and #100 strips and lancets with 3 rf given to patient.  Check sugar daily in the meantime and update me next week.   Drink plenty of water in the meantime. Plan on recheck A1c in about 3 months.  Eat right diet d/w pt.   We'll make more plans when I get follow-up information patient. She agrees. Outside labs sent for scanning, reviewed at time of office visit with patient. >25 minutes spent in face to face time with patient, >50% spent in counselling or coordination of care.  Hopefully she'll be able to taper down from hormone replacement. This may be contributing to her current symptoms. She has follow-up with gynecology pending.

## 2015-10-18 NOTE — Patient Instructions (Signed)
Add on 5mg  amlopidine.  If still >140/>90 after 5 days, then increase to 10mg  a day.  Check sugar daily in the meantime and update me next week.   Drink plenty of water in the meantime.  Take care.  Glad to see you.  Plan on recheck A1c in about 3 months.

## 2015-10-18 NOTE — Progress Notes (Signed)
She has been on megace after being on provera for vaginal bleeding.  Was prev on 160mg  of megace a day, now down to 80mg  a day, with the plan to taper more.    She was seeing Dr. Leonides Schanz with gyn clinic, with f/u pending for another endometrial biopsy.    Insomnia, weight is neutral.  More jittery on the megace, having hot flashes.  A1c up in the meantime, d/w pt.    BP elevation.  No CP SOB BLE edema.    PMH and SH reviewed  ROS: Per HPI unless specifically indicated in ROS section   Meds, vitals, and allergies reviewed.   GEN: nad, alert and oriented HEENT: mucous membranes moist NECK: supple w/o LA CV: rrr.   PULM: ctab, no inc wob ABD: soft, +bs EXT: no edema SKIN: no acute rash Faint B hand tremor noted- improved on lower dose of megace per patient report.

## 2015-10-18 NOTE — Progress Notes (Signed)
Pre visit review using our clinic review tool, if applicable. No additional management support is needed unless otherwise documented below in the visit note. 

## 2015-10-20 ENCOUNTER — Encounter: Payer: Self-pay | Admitting: Family Medicine

## 2015-10-21 ENCOUNTER — Encounter: Payer: Self-pay | Admitting: Family Medicine

## 2015-10-21 NOTE — Assessment & Plan Note (Signed)
Add on 5mg  amlopidine.  If still >140/>90 after 5 days, then increase to 10mg  a day.

## 2015-10-22 ENCOUNTER — Other Ambulatory Visit: Payer: Self-pay | Admitting: Family Medicine

## 2015-10-22 MED ORDER — AMLODIPINE BESYLATE 5 MG PO TABS: 5.0000 mg | ORAL_TABLET | Freq: Every day | ORAL | 3 refills | Status: DC

## 2015-10-23 ENCOUNTER — Telehealth: Payer: Self-pay | Admitting: Family Medicine

## 2015-10-23 DIAGNOSIS — E281 Androgen excess: Secondary | ICD-10-CM

## 2015-10-23 NOTE — Telephone Encounter (Signed)
Patient notified as instructed by telephone and verbalized understanding. Patient stated that she is okay with the referral, but is having surgery in September and would like to have something scheduled in October. Advised patient that one of the referral coordinators will be in touch to get this set up for her.

## 2015-10-23 NOTE — Telephone Encounter (Signed)
I put it in. Thanks!

## 2015-10-23 NOTE — Telephone Encounter (Signed)
Left message on voice mail  to call back

## 2015-10-23 NOTE — Telephone Encounter (Signed)
Please call pt.  I talked with Dr. Leonides Schanz yesterday.  She was asking about patient seeing endocrine re: androgen excess.   Patient had seen Dr. Cheree Ditto with endo.   I think it is reasonable to consider seeing endo again, but Dr. Howell Rucks has moved.  Is patient okay with endo referral? Let me know.  Thanks.

## 2015-10-28 ENCOUNTER — Encounter: Payer: Self-pay | Admitting: Family Medicine

## 2015-11-04 DIAGNOSIS — N939 Abnormal uterine and vaginal bleeding, unspecified: Secondary | ICD-10-CM | POA: Insufficient documentation

## 2015-11-04 NOTE — H&P (Signed)
Obstetrics and Gynecology Preoperative History and Physical   Appointment Date: 11/04/2015  OBGYN Clinic: [facility]  Primary Care Provider: Leonides Fry  Referring Provider: Self  Chief Complaint:  Chief Complaint  Patient presents with  . Pre Op Consulting    History of Present Illness: Alexandria Fry is a 60 y.o. Caucasian [id number] (Patient's last menstrual period was 10/21/2015 (approximate). She has been being treated for abnormal uterine bleeding.  In 1/17 she had a D&C that showed hyperplasia without atypia for heavy menses, and was put on provera.  She began bleeding heavily after this, and ultrasound showed stripe of 1cm.  Progesterone was increased over time to a high dose - Megace 80mg  BID.  Due to this she has had refractory hemoglobin A1c rising and blood pressure increases.  It is prudent that we remove her from the progesterone to resolve these side-effects.  No breast s/s, fevers, chills, chest pain, SOB, nausea, vomiting, abdominal pain, dysuria, hematuria, vaginal itching, dyspareunia, SUI or OAB, diarrhea, constipation, blood in BMs  Review of Systems: Positive for vaginal bleeding.   Her review of systems is negative or as noted in the History of Present Illness.  There are no active problems to display for this patient.   The following portions of the patient's history were reviewed and updated as appropriate.  Past Medical History:  Past Medical History:  Diagnosis Date  . Abnormal uterine bleeding   . Abnormal uterine bleeding (AUB)   . Endometrial hyperplasia without atypia, simple   . History of Papanicolaou smear of cervix    08/08/10 neg/neg  . Hypertension   . Hypothyroidism (acquired)    takes synthroid  . Menorrhagia     Past Surgical History:  Past Surgical History:  Procedure Laterality Date  . CERVICAL CONE BIOPSY     cone bx  . DILATION AND CURETTAGE OF UTERUS    . DILATION AND CURETTAGE, DIAGNOSTIC / THERAPEUTIC  5/11/,  2/16   PMB    Past Obstetrical History:  OB History  Gravida Para Term Preterm AB Living  3 2 2  1 2   SAB TAB Ectopic Multiple Live Births  1    2    # Outcome Date GA Lbr Len/2nd Weight Sex Delivery Anes PTL Lv  3 Term 03/03/89   4.479 kg (9 lb 14 oz) M Vag-Spont     2 Term 11/10/86   3.402 kg (7 lb 8 oz) M Vag-Forceps     1 SAB 03/19/85              SVD x 2, Cesarean section x 0  Past Gynecological History: As per HPI. Menarche age 30 Periods: irregular for years yes history of abnormal pap smears (last pap smear: 2017, which was neg/neg) no history of STIs.   She is currently using Progestin- only pill for contraception.  no HRT use.  HPV vaccine series complete: no  Social History:  Social History   Social History  . Marital status: Married    Spouse name: N/A  . Number of children: N/A  . Years of education: N/A   Occupational History  . Not on file.   Social History Main Topics  . Smoking status: Never Smoker  . Smokeless tobacco: Never Used  . Alcohol use Yes     Comment: daily  . Drug use: No  . Sexual activity: No   Other Topics Concern  . Not on file   Social History Narrative  . No narrative  on file    Family History:  Family History  Problem Relation Age of Onset  . Breast cancer Mother     Breast neoplasm, malignant  . Colon cancer Mother   . Hyperlipidemia Father   . Pancreatic cancer Father     neoplasm, malignant   She has no family history of bleeding or blood clotting disorders.   Health Maintenance:  Mammogram yes, which was done in 2016;  Colonoscopy yes within last 5 years Flu shot no will get after surgery  Medications Alexandria Fry does not currently have medications on file. Current Outpatient Prescriptions  Medication Sig Dispense Refill  . amLODIPine (NORVASC) 5 MG tablet TAKE 1-2 TABLETS (5-10 MG TOTAL) BY MOUTH DAILY.  3  . cholecalciferol (CHOLECALCIFEROL) 1,000 unit tablet Take 2,000 Units by mouth 2 (two) times  daily.    . megestrol (MEGACE) 40 MG tablet Take 40 mg by mouth 2 (two) times daily.    4  . metoprolol succinate (TOPROL-XL) 50 MG XL tablet Take 50 mg by mouth 2 (two) times daily.  3  . ALREX 0.2 % DrpS ophthalmic suspension INSTILL 1 DROP IN THE AFFECTED EYE(S) TWICE DAILY AS NEEDED.  0   No current facility-administered medications for this visit.     Allergies Penicillins; Sulfa (sulfonamide antibiotics); Ace inhibitors; Acetaminophen; Amoxicillin; Augmentin [amoxicillin-pot clavulanate]; Azithromycin; Bactrim [sulfamethoxazole-trimethoprim]; Benadryl [diphenhydramine hcl]; Ceftin [cefuroxime axetil]; Hydrochlorothiazide; Hydrocodone-acetaminophen; Levofloxacin; Macrobid [nitrofurantoin monohyd/m-cryst]; Maxzide [triamterene-hydrochlorothiazid]; Metformin; Nitrofurantoin; Oxycodone; and Trimethoprim   Physical Exam:  BP (!) 176/90  Pulse 73  Ht 165.1 cm (5\' 5" )  Wt (!) 107.9 kg (237 lb 12.8 oz)  LMP 10/21/2015 (Approximate)  BMI 39.57 kg/m2 Body mass index is 39.57 kg/(m^2). General appearance: Well nourished, well developed female in no acute distress.  Neck:  Supple, normal appearance, and no thyromegaly  Cardiovascular: normal s1 and s2.  No murmurs, rubs or gallops. Respiratory:  Clear to auscultation bilateral. Normal respiratory effort Abdomen: positive bowel sounds and no masses, hernias; diffusely non tender to palpation, non distended Neuro/Psych:  Normal mood and affect.  Skin:  Warm and dry.      Assessment: 60yo [id number] with abnormal uterine bleeding, refractory to progesterone and desiring of permanent intervention Plan:  1. I have had a careful and thorough discussion with Alexandria Fry regarding treatment, options, and her response thus far to progesterone since her D&C in January.  I believe her hypertension and glucose abnormalities are directly due to the progesteone at such high dosing.  She has been worked up for [employer] and other adrenal gland disorders and  has been negative.  The uterine bleeding is a response to hormonal abnormalities, and removing the organ would allow her to cease progesterone treatment altogether.  She desires this treatment in no uncertain terms.   2. Consents for Total Laparoscopic Hysterectomy, Bilateral Salpingectomy with possible oophorectomy were signed.  Since she is 49, and has a family history of breast and colon cancer, it would be reasonable to remove the ovaries, however since she is not yet menopausal, she asks to retain the ovaries if possible.  Thus, if an ovary appears to be abnormal, she asks that it be removed, but if it appears normal to be left insitu.  Should there be endometrial cancer (which is not suspected) then bilateral oophorectomy is warranted.   Risks and alternatives were discussed.  3. Surgery is tentatively scheduled for 11/11/15, with pre-op on 11/07/15.  I have discussed her case with her PCP, Dr. Damita Dunnings, and  he has cleared her for surgery.  She will remain on her metoprolol including day-of surgery.    RTC 3 weeks (2 weeks from surgery for post-op)  Larey Days, MD Attending [facility], Department of OB/GYN

## 2015-11-07 ENCOUNTER — Encounter
Admission: RE | Admit: 2015-11-07 | Discharge: 2015-11-07 | Disposition: A | Discharge status: Home / Self Care | Payer: Managed Care, Other (non HMO) | Source: Ambulatory Visit | Attending: Obstetrics & Gynecology | Admitting: Obstetrics & Gynecology

## 2015-11-07 DIAGNOSIS — Z01812 Encounter for preprocedural laboratory examination: Secondary | ICD-10-CM | POA: Insufficient documentation

## 2015-11-07 DIAGNOSIS — I10 Essential (primary) hypertension: Secondary | ICD-10-CM | POA: Diagnosis not present

## 2015-11-07 DIAGNOSIS — Z01810 Encounter for preprocedural cardiovascular examination: Secondary | ICD-10-CM | POA: Insufficient documentation

## 2015-11-07 HISTORY — DX: Other specified postprocedural states: Z98.890

## 2015-11-07 HISTORY — DX: Nausea with vomiting, unspecified: R11.2

## 2015-11-07 HISTORY — DX: Failed or difficult intubation, initial encounter: T88.4XXA

## 2015-11-07 LAB — BASIC METABOLIC PANEL
Anion gap: 9 (ref 5–15)
BUN: 14 mg/dL (ref 6–20)
CHLORIDE: 107 mmol/L (ref 101–111)
CO2: 22 mmol/L (ref 22–32)
CREATININE: 0.78 mg/dL (ref 0.44–1.00)
Calcium: 9.5 mg/dL (ref 8.9–10.3)
GFR calc non Af Amer: >60 mL/min (ref >60)
Glucose, Bld: 186 mg/dL — ABNORMAL HIGH (ref 65–99)
POTASSIUM: 3.9 mmol/L (ref 3.5–5.1)
Sodium: 138 mmol/L (ref 135–145)

## 2015-11-07 LAB — CBC
HEMATOCRIT: 46.3 % (ref 35.0–47.0)
HEMOGLOBIN: 15.8 g/dL (ref 12.0–16.0)
MCH: 34.5 pg — AB (ref 26.0–34.0)
MCHC: 34.2 g/dL (ref 32.0–36.0)
MCV: 101.1 fL — AB (ref 80.0–100.0)
PLATELETS: 206 10*3/uL (ref 150–440)
RBC: 4.58 MIL/uL (ref 3.80–5.20)
RDW: 12.4 % (ref 11.5–14.5)
WBC: 8.6 10*3/uL (ref 3.6–11.0)

## 2015-11-07 LAB — TYPE AND SCREEN
ABO/RH(D): A POS
ANTIBODY SCREEN: NEGATIVE

## 2015-11-07 LAB — PROTIME-INR
INR: 1.1
Prothrombin Time: 14.2 seconds (ref 11.4–15.2)

## 2015-11-07 NOTE — Patient Instructions (Signed)
Your procedure is scheduled on: Monday 11/11/15 Report to Day Surgery. 2ND FLOOR MEDICAL MALL ENTRANCE To find out your arrival time please call [phone] between 1PM - 3PM on Friday 11/08/15.  Remember: Instructions that are not followed completely may result in serious medical risk, up to and including death, or upon the discretion of your surgeon and anesthesiologist your surgery may need to be rescheduled.    __X__ 1. Do not eat food or drink liquids after midnight. No gum chewing or hard candies.     __X__ 2. No AlcohoL/NO SMOKINGl for 24 hours before or after surgery.   ____ 3. Bring all medications with you on the day of surgery if instructed.    __X__ 4. Notify your doctor if there is any change in your medical condition     (cold, fever, infections).     Do not wear jewelry, make-up, hairpins, clips or nail polish.  Do not wear lotions, powders, or perfumes.   Do not shave 48 hours prior to surgery. Men may shave face and neck.  Do not bring valuables to the hospital.    [facility] is not responsible for any belongings or valuables.               Contacts, dentures or bridgework may not be worn into surgery.  Leave your suitcase in the car. After surgery it may be brought to your room.  For patients admitted to the hospital, discharge time is determined by your                treatment team.   Patients discharged the day of surgery will not be allowed to drive home.   Please read over the following fact sheets that you were given:     __X__ Take these medicines the morning of surgery with A SIP OF WATER:    1. AMLODIPINE  2. METOPROLOL  3.   4.  5.  6.  ____ Fleet Enema (as directed)   __X__ Use CHG Soap as directed  ____ Use inhalers on the day of surgery  ____ Stop metformin 2 days prior to surgery    ____ Take 1/2 of usual insulin dose the night before surgery and none on the morning of surgery.   ____ Stop Coumadin/Plavix/aspirin on   __X__ Stop  Anti-inflammatories on TODAY STOP IBUPROFEN   ____ Stop supplements until after surgery.    ____ Bring C-Pap to the hospital.

## 2015-11-08 ENCOUNTER — Encounter: Payer: Self-pay | Admitting: Family Medicine

## 2015-11-10 MED ORDER — GENTAMICIN SULFATE 40 MG/ML IJ SOLN
5.0000 mg/kg | INTRAMUSCULAR | Status: AC
  Administered 2015-11-11: 390 mg via INTRAVENOUS
  Filled 2015-11-10: qty 9.75

## 2015-11-10 MED ORDER — GENTAMICIN SULFATE 40 MG/ML IJ SOLN: INTRAMUSCULAR | Status: DC

## 2015-11-10 MED ORDER — CLINDAMYCIN PHOSPHATE 900 MG/50ML IV SOLN
900.0000 mg | INTRAVENOUS | Status: AC
  Administered 2015-11-11: 900 mg via INTRAVENOUS

## 2015-11-11 ENCOUNTER — Ambulatory Visit: Payer: Managed Care, Other (non HMO) | Admitting: Certified Registered Nurse Anesthetist

## 2015-11-11 ENCOUNTER — Inpatient Hospital Stay
Admission: AD | Admit: 2015-11-11 | Discharge: 2015-11-12 | DRG: 743 | Disposition: A | Discharge status: Home / Self Care | Payer: Managed Care, Other (non HMO) | Source: Ambulatory Visit | Attending: Obstetrics & Gynecology | Admitting: Obstetrics & Gynecology

## 2015-11-11 ENCOUNTER — Encounter
Admission: AD | Disposition: A | Discharge status: Home / Self Care | Payer: Self-pay | Source: Ambulatory Visit | Attending: Obstetrics & Gynecology

## 2015-11-11 ENCOUNTER — Encounter: Payer: Self-pay | Admitting: *Deleted

## 2015-11-11 DIAGNOSIS — R739 Hyperglycemia, unspecified: Secondary | ICD-10-CM | POA: Diagnosis not present

## 2015-11-11 DIAGNOSIS — Z882 Allergy status to sulfonamides status: Secondary | ICD-10-CM | POA: Diagnosis not present

## 2015-11-11 DIAGNOSIS — Y92234 Operating room of hospital as the place of occurrence of the external cause: Secondary | ICD-10-CM | POA: Diagnosis not present

## 2015-11-11 DIAGNOSIS — K219 Gastro-esophageal reflux disease without esophagitis: Secondary | ICD-10-CM | POA: Diagnosis present

## 2015-11-11 DIAGNOSIS — Z888 Allergy status to other drugs, medicaments and biological substances status: Secondary | ICD-10-CM | POA: Diagnosis not present

## 2015-11-11 DIAGNOSIS — I10 Essential (primary) hypertension: Secondary | ICD-10-CM | POA: Diagnosis present

## 2015-11-11 DIAGNOSIS — N939 Abnormal uterine and vaginal bleeding, unspecified: Secondary | ICD-10-CM | POA: Diagnosis present

## 2015-11-11 DIAGNOSIS — T380X5A Adverse effect of glucocorticoids and synthetic analogues, initial encounter: Secondary | ICD-10-CM | POA: Diagnosis not present

## 2015-11-11 DIAGNOSIS — N8500 Endometrial hyperplasia, unspecified: Secondary | ICD-10-CM | POA: Diagnosis present

## 2015-11-11 DIAGNOSIS — Z9889 Other specified postprocedural states: Secondary | ICD-10-CM

## 2015-11-11 HISTORY — PX: CYSTOSCOPY: SHX5120

## 2015-11-11 HISTORY — PX: LAPAROSCOPIC BILATERAL SALPINGECTOMY: SHX5889

## 2015-11-11 HISTORY — PX: LAPAROSCOPIC SUPRACERVICAL HYSTERECTOMY: SHX5399

## 2015-11-11 HISTORY — PX: LAPAROTOMY: SHX154

## 2015-11-11 LAB — GLUCOSE, CAPILLARY
GLUCOSE-CAPILLARY: 367 mg/dL — AB (ref 65–99)
Glucose-Capillary: 170 mg/dL — ABNORMAL HIGH (ref 65–99)
Glucose-Capillary: 270 mg/dL — ABNORMAL HIGH (ref 65–99)

## 2015-11-11 LAB — CBC
HCT: 43.3 % (ref 35.0–47.0)
HEMOGLOBIN: 15 g/dL (ref 12.0–16.0)
MCH: 35.1 pg — AB (ref 26.0–34.0)
MCHC: 34.7 g/dL (ref 32.0–36.0)
MCV: 101.1 fL — ABNORMAL HIGH (ref 80.0–100.0)
PLATELETS: 209 10*3/uL (ref 150–440)
RBC: 4.28 MIL/uL (ref 3.80–5.20)
RDW: 12.3 % (ref 11.5–14.5)
WBC: 17.3 10*3/uL — ABNORMAL HIGH (ref 3.6–11.0)

## 2015-11-11 LAB — CREATININE, SERUM: CREATININE: 0.9 mg/dL (ref 0.44–1.00)

## 2015-11-11 LAB — POCT PREGNANCY, URINE: PREG TEST UR: NEGATIVE

## 2015-11-11 SURGERY — SALPINGECTOMY, BILATERAL, LAPAROSCOPIC
Anesthesia: General | Laterality: Bilateral | Wound class: Clean Contaminated

## 2015-11-11 MED ORDER — INSULIN ASPART 100 UNIT/ML ~~LOC~~ SOLN
0.0000 [IU] | SUBCUTANEOUS | Status: DC
  Administered 2015-11-11: 20 [IU] via SUBCUTANEOUS
  Administered 2015-11-12 (×2): 11 [IU] via SUBCUTANEOUS
  Administered 2015-11-12: 15 [IU] via SUBCUTANEOUS
  Filled 2015-11-11: qty 11
  Filled 2015-11-11: qty 15
  Filled 2015-11-11: qty 11
  Filled 2015-11-11: qty 20

## 2015-11-11 MED ORDER — CODEINE SULFATE 30 MG PO TABS: 30.0000 mg | ORAL_TABLET | ORAL | Status: DC | PRN

## 2015-11-11 MED ORDER — BUPIVACAINE HCL (PF) 0.5 % IJ SOLN
INTRAMUSCULAR | Status: DC | PRN
  Administered 2015-11-11: 30 mL

## 2015-11-11 MED ORDER — SODIUM CHLORIDE 0.9 % IV SOLN
INTRAVENOUS | Status: DC
  Administered 2015-11-11 (×2): via INTRAVENOUS

## 2015-11-11 MED ORDER — SIMETHICONE 80 MG PO CHEW: 160.0000 mg | CHEWABLE_TABLET | Freq: Four times a day (QID) | ORAL | Status: DC | PRN

## 2015-11-11 MED ORDER — INFLUENZA VAC SPLIT QUAD 0.5 ML IM SUSY
0.5000 mL | PREFILLED_SYRINGE | INTRAMUSCULAR | Status: AC
  Administered 2015-11-12: 0.5 mL via INTRAMUSCULAR
  Filled 2015-11-11: qty 0.5

## 2015-11-11 MED ORDER — PROPOFOL 10 MG/ML IV BOLUS
INTRAVENOUS | Status: DC | PRN
  Administered 2015-11-11: 180 mg via INTRAVENOUS
  Administered 2015-11-11: 100 mg via INTRAVENOUS

## 2015-11-11 MED ORDER — PROPOFOL 500 MG/50ML IV EMUL
INTRAVENOUS | Status: DC | PRN
  Administered 2015-11-11: 50 ug/kg/min via INTRAVENOUS

## 2015-11-11 MED ORDER — ONDANSETRON HCL 4 MG/2ML IJ SOLN: 4.0000 mg | Freq: Four times a day (QID) | INTRAMUSCULAR | Status: DC | PRN

## 2015-11-11 MED ORDER — KETAMINE HCL 50 MG/ML IJ SOLN
INTRAMUSCULAR | Status: DC | PRN
  Administered 2015-11-11 (×6): 25 mg via INTRAMUSCULAR

## 2015-11-11 MED ORDER — FLUORESCEIN SODIUM 10 % IV SOLN
INTRAVENOUS | Status: AC
  Filled 2015-11-11: qty 5

## 2015-11-11 MED ORDER — MORPHINE SULFATE (PF) 2 MG/ML IV SOLN: 1.0000 mg | INTRAVENOUS | Status: DC | PRN

## 2015-11-11 MED ORDER — BUPIVACAINE HCL (PF) 0.5 % IJ SOLN
INTRAMUSCULAR | Status: AC
  Filled 2015-11-11: qty 30

## 2015-11-11 MED ORDER — SODIUM CHLORIDE 0.9 % IV SOLN
50.0000 mL/h | INTRAVENOUS | Status: DC
  Administered 2015-11-11: 50 mL/h via INTRAVENOUS

## 2015-11-11 MED ORDER — KETOROLAC TROMETHAMINE 30 MG/ML IJ SOLN
15.0000 mg | Freq: Four times a day (QID) | INTRAMUSCULAR | Status: AC
  Administered 2015-11-11 – 2015-11-12 (×4): 15 mg via INTRAVENOUS
  Filled 2015-11-11 (×4): qty 1

## 2015-11-11 MED ORDER — DOCUSATE SODIUM 100 MG PO CAPS
100.0000 mg | ORAL_CAPSULE | Freq: Two times a day (BID) | ORAL | Status: DC
  Administered 2015-11-11: 100 mg via ORAL
  Filled 2015-11-11 (×3): qty 1

## 2015-11-11 MED ORDER — FAMOTIDINE 20 MG PO TABS
20.0000 mg | ORAL_TABLET | Freq: Once | ORAL | Status: AC
  Administered 2015-11-11: 20 mg via ORAL

## 2015-11-11 MED ORDER — METHYLPREDNISOLONE SODIUM SUCC 125 MG IJ SOLR
125.0000 mg | Freq: Two times a day (BID) | INTRAMUSCULAR | Status: DC
  Administered 2015-11-11: 125 mg via INTRAVENOUS
  Filled 2015-11-11 (×3): qty 2

## 2015-11-11 MED ORDER — ONDANSETRON HCL 4 MG PO TABS: 4.0000 mg | ORAL_TABLET | Freq: Four times a day (QID) | ORAL | Status: DC | PRN

## 2015-11-11 MED ORDER — AMLODIPINE BESYLATE 10 MG PO TABS
10.0000 mg | ORAL_TABLET | Freq: Every day | ORAL | Status: DC
  Administered 2015-11-12: 10 mg via ORAL
  Filled 2015-11-11: qty 1

## 2015-11-11 MED ORDER — IBUPROFEN 600 MG PO TABS: 600.0000 mg | ORAL_TABLET | Freq: Four times a day (QID) | ORAL | Status: DC

## 2015-11-11 MED ORDER — ENOXAPARIN SODIUM 40 MG/0.4ML ~~LOC~~ SOLN
40.0000 mg | SUBCUTANEOUS | Status: DC
  Administered 2015-11-12: 40 mg via SUBCUTANEOUS
  Filled 2015-11-11: qty 0.4

## 2015-11-11 MED ORDER — FLUORESCEIN SODIUM 10 % IV SOLN
INTRAVENOUS | Status: DC | PRN
  Administered 2015-11-11: .5 mL via INTRAVENOUS

## 2015-11-11 MED ORDER — SUCCINYLCHOLINE CHLORIDE 20 MG/ML IJ SOLN
INTRAMUSCULAR | Status: DC | PRN
  Administered 2015-11-11: 120 mg via INTRAVENOUS
  Administered 2015-11-11: 100 mg via INTRAVENOUS

## 2015-11-11 MED ORDER — METOPROLOL SUCCINATE ER 50 MG PO TB24
50.0000 mg | ORAL_TABLET | Freq: Two times a day (BID) | ORAL | Status: DC
  Administered 2015-11-11 – 2015-11-12 (×2): 50 mg via ORAL
  Filled 2015-11-11 (×2): qty 1

## 2015-11-11 MED ORDER — ONDANSETRON HCL 4 MG/2ML IJ SOLN
INTRAMUSCULAR | Status: DC | PRN
  Administered 2015-11-11 (×2): 4 mg via INTRAVENOUS

## 2015-11-11 MED ORDER — ONDANSETRON HCL 4 MG/2ML IJ SOLN: 4.0000 mg | Freq: Once | INTRAMUSCULAR | Status: DC | PRN

## 2015-11-11 MED ORDER — LORATADINE 10 MG PO TABS
10.0000 mg | ORAL_TABLET | Freq: Every day | ORAL | Status: DC
  Filled 2015-11-11 (×4): qty 1

## 2015-11-11 MED ORDER — KETOROLAC TROMETHAMINE 30 MG/ML IJ SOLN: 30.0000 mg | Freq: Once | INTRAMUSCULAR | Status: DC

## 2015-11-11 MED ORDER — FUROSEMIDE 10 MG/ML IJ SOLN
INTRAMUSCULAR | Status: DC | PRN
Start: 2015-11-11 — End: 2015-11-11
  Administered 2015-11-11: 20 mg via INTRAMUSCULAR

## 2015-11-11 MED ORDER — ACETAMINOPHEN 10 MG/ML IV SOLN
INTRAVENOUS | Status: DC | PRN
  Administered 2015-11-11: 1000 mg via INTRAVENOUS

## 2015-11-11 MED ORDER — DEXAMETHASONE SODIUM PHOSPHATE 10 MG/ML IJ SOLN
INTRAMUSCULAR | Status: DC | PRN
  Administered 2015-11-11: 20 mg via INTRAVENOUS

## 2015-11-11 MED ORDER — ACETAMINOPHEN 500 MG PO TABS
1000.0000 mg | ORAL_TABLET | Freq: Four times a day (QID) | ORAL | Status: DC
  Administered 2015-11-11 – 2015-11-12 (×4): 1000 mg via ORAL
  Filled 2015-11-11 (×4): qty 2

## 2015-11-11 MED ORDER — KETOROLAC TROMETHAMINE 30 MG/ML IJ SOLN: 30.0000 mg | Freq: Four times a day (QID) | INTRAMUSCULAR | Status: DC

## 2015-11-11 MED ORDER — FENTANYL CITRATE (PF) 100 MCG/2ML IJ SOLN: 25.0000 ug | INTRAMUSCULAR | Status: DC | PRN

## 2015-11-11 MED ORDER — ATROPINE SULFATE 0.4 MG/ML IJ SOLN
INTRAMUSCULAR | Status: DC | PRN
  Administered 2015-11-11: 0.4 mg via INTRAVENOUS

## 2015-11-11 MED ORDER — FENTANYL CITRATE (PF) 100 MCG/2ML IJ SOLN
INTRAMUSCULAR | Status: DC | PRN
  Administered 2015-11-11: 50 ug via INTRAVENOUS

## 2015-11-11 MED ORDER — CLINDAMYCIN PHOSPHATE 900 MG/50ML IV SOLN
INTRAVENOUS | Status: AC
  Filled 2015-11-11: qty 50

## 2015-11-11 MED ORDER — FAMOTIDINE 20 MG PO TABS
ORAL_TABLET | ORAL | Status: AC
  Filled 2015-11-11: qty 1

## 2015-11-11 MED ORDER — MIDAZOLAM HCL 2 MG/2ML IJ SOLN
INTRAMUSCULAR | Status: DC | PRN
  Administered 2015-11-11: 1 mg via INTRAVENOUS
  Administered 2015-11-11: 2 mg via INTRAVENOUS

## 2015-11-11 MED ORDER — SCOPOLAMINE 1 MG/3DAYS TD PT72
MEDICATED_PATCH | TRANSDERMAL | Status: AC
  Filled 2015-11-11: qty 1

## 2015-11-11 MED ORDER — PHENYLEPHRINE HCL 10 MG/ML IJ SOLN
INTRAMUSCULAR | Status: DC | PRN
  Administered 2015-11-11 (×9): 100 ug via INTRAVENOUS

## 2015-11-11 MED ORDER — EPHEDRINE SULFATE 50 MG/ML IJ SOLN
INTRAMUSCULAR | Status: DC | PRN
  Administered 2015-11-11 (×2): 25 mg via INTRAVENOUS

## 2015-11-11 MED ORDER — LIDOCAINE HCL (CARDIAC) 20 MG/ML IV SOLN
INTRAVENOUS | Status: DC | PRN
  Administered 2015-11-11: 100 mg via INTRAVENOUS

## 2015-11-11 MED ORDER — CODEINE SULFATE 15 MG PO TABS
15.0000 mg | ORAL_TABLET | ORAL | Status: DC | PRN
Start: 2015-11-11 — End: 2015-11-12

## 2015-11-11 MED ORDER — METHYLPREDNISOLONE SODIUM SUCC 125 MG IJ SOLR
INTRAMUSCULAR | Status: DC | PRN
  Administered 2015-11-11: 125 mg via INTRAVENOUS

## 2015-11-11 MED ORDER — HEPARIN SODIUM (PORCINE) 5000 UNIT/ML IJ SOLN
INTRAMUSCULAR | Status: AC
  Filled 2015-11-11: qty 1

## 2015-11-11 MED ORDER — SCOPOLAMINE 1 MG/3DAYS TD PT72
1.0000 | MEDICATED_PATCH | Freq: Once | TRANSDERMAL | Status: DC
  Administered 2015-11-11: 1.5 mg via TRANSDERMAL

## 2015-11-11 MED ORDER — HEPARIN SODIUM (PORCINE) 5000 UNIT/ML IJ SOLN
5000.0000 [IU] | INTRAMUSCULAR | Status: AC
  Administered 2015-11-11: 5000 [IU] via SUBCUTANEOUS

## 2015-11-11 MED ORDER — BUPIVACAINE LIPOSOME 1.3 % IJ SUSP
INTRAMUSCULAR | Status: AC
  Filled 2015-11-11: qty 20

## 2015-11-11 MED ORDER — EVICEL 2 ML EX KIT
PACK | CUTANEOUS | Status: DC | PRN
  Administered 2015-11-11: 2 mL

## 2015-11-11 MED ORDER — EVICEL 2 ML EX KIT
PACK | CUTANEOUS | Status: AC
  Filled 2015-11-11: qty 1

## 2015-11-11 MED ORDER — SODIUM CHLORIDE 0.9 % IV SOLN
INTRAVENOUS | Status: DC | PRN
  Administered 2015-11-11: 70 mL

## 2015-11-11 MED ORDER — SODIUM CHLORIDE 0.9 % IJ SOLN
INTRAMUSCULAR | Status: AC
  Filled 2015-11-11: qty 50

## 2015-11-11 MED ORDER — MENTHOL 3 MG MT LOZG
1.0000 | LOZENGE | OROMUCOSAL | Status: DC | PRN
  Filled 2015-11-11: qty 9

## 2015-11-11 SURGICAL SUPPLY — 100 items
BACTOSHIELD CHG 4% 4OZ (MISCELLANEOUS) ×2
BAG URO DRAIN 2000ML W/SPOUT (MISCELLANEOUS) ×5 IMPLANT
BLADE SURG SZ11 CARB STEEL (BLADE) ×8 IMPLANT
CANISTER SUCT 1200ML W/VALVE (MISCELLANEOUS) ×5 IMPLANT
CATH FOLEY 2WAY  5CC 16FR (CATHETERS) ×2
CATH FOLEY 2WAY 5CC 16FR (CATHETERS) ×3
CATH ROBINSON RED A/P 16FR (CATHETERS) ×2 IMPLANT
CATH URTH 16FR FL 2W BLN LF (CATHETERS) ×3 IMPLANT
CHLORAPREP W/TINT 26ML (MISCELLANEOUS) ×10 IMPLANT
COVER LIGHT HANDLE STERIS (MISCELLANEOUS) ×3 IMPLANT
DECANTER SPIKE VIAL GLASS SM (MISCELLANEOUS) ×6 IMPLANT
DEFOGGER SCOPE WARMER CLEARIFY (MISCELLANEOUS) ×5 IMPLANT
DEVICE SUTURE ENDOST 10MM (ENDOMECHANICALS) IMPLANT
DRAPE LEGGINS SURG 28X43 STRL (DRAPES) ×5 IMPLANT
DRAPE SHEET LG 3/4 BI-LAMINATE (DRAPES) ×5 IMPLANT
DRAPE UNDER BUTTOCK W/FLU (DRAPES) ×5 IMPLANT
DRESSING TELFA 4X3 1S ST N-ADH (GAUZE/BANDAGES/DRESSINGS) ×15 IMPLANT
DRSG TEGADERM 2-3/8X2-3/4 SM (GAUZE/BANDAGES/DRESSINGS) ×10 IMPLANT
ELECT EZSTD 165MM 6.5IN (MISCELLANEOUS) ×5
ELECTRODE EZSTD 165MM 6.5IN (MISCELLANEOUS) ×1 IMPLANT
ENDOPOUCH RETRIEVER 10 (MISCELLANEOUS) ×5 IMPLANT
GAUZE SPONGE NON-WVN 2X2 STRL (MISCELLANEOUS) ×6 IMPLANT
GLOVE BIO SURGEON STRL SZ 6.5 (GLOVE) ×6 IMPLANT
GLOVE BIO SURGEONS STRL SZ 6.5 (GLOVE) ×3
GLOVE BIOGEL PI IND STRL 6.5 (GLOVE) ×5 IMPLANT
GLOVE BIOGEL PI INDICATOR 6.5 (GLOVE) ×6
GLOVE PI ORTHOPRO 6.5 (GLOVE) ×2
GLOVE PI ORTHOPRO STRL 6.5 (GLOVE) ×3 IMPLANT
GLOVE PROTEXIS LATEX SZ 7.5 (GLOVE) ×10 IMPLANT
GLOVE SURG LATEX 7.5 PF (GLOVE) IMPLANT
GLOVE SURG SYN 6.5 ES PF (GLOVE) ×20 IMPLANT
GLOVE SURG SYN 6.5 PF PI (GLOVE) ×6 IMPLANT
GOWN STRL REUS W/ TWL LRG LVL3 (GOWN DISPOSABLE) ×9 IMPLANT
GOWN STRL REUS W/ TWL XL LVL3 (GOWN DISPOSABLE) ×4 IMPLANT
GOWN STRL REUS W/TWL LRG LVL3 (GOWN DISPOSABLE) ×15
GOWN STRL REUS W/TWL XL LVL3 (GOWN DISPOSABLE) ×10
GRADUATE 1200CC STRL 31836 (MISCELLANEOUS) ×3 IMPLANT
GRASPER SUT TROCAR 14GX15 (MISCELLANEOUS) ×5 IMPLANT
HANDLE YANKAUER SUCT BULB TIP (MISCELLANEOUS) ×6 IMPLANT
IRRIGATION STRYKERFLOW (MISCELLANEOUS) ×3 IMPLANT
IRRIGATOR STRYKERFLOW (MISCELLANEOUS) ×5
IV LACTATED RINGERS 1000ML (IV SOLUTION) ×5 IMPLANT
IV NS 1000ML (IV SOLUTION) ×5
IV NS 1000ML BAXH (IV SOLUTION) ×1 IMPLANT
KIT PINK PAD W/HEAD ARE REST (MISCELLANEOUS) ×5
KIT PINK PAD W/HEAD ARM REST (MISCELLANEOUS) ×3 IMPLANT
KIT RM TURNOVER CYSTO AR (KITS) ×5 IMPLANT
L-HOOK LAP DISP 36CM (ELECTROSURGICAL) ×5
LABEL OR SOLS (LABEL) IMPLANT
LHOOK LAP DISP 36CM (ELECTROSURGICAL) ×3 IMPLANT
LIGASURE BLUNT 5MM 37CM (INSTRUMENTS) ×5 IMPLANT
LIGASURE MARYLAND LAP STAND (ELECTROSURGICAL) ×5 IMPLANT
LIGASURE VESSEL 5MM BLUNT TIP (ELECTROSURGICAL) ×3 IMPLANT
LIQUID BAND (GAUZE/BANDAGES/DRESSINGS) ×5 IMPLANT
MANIPULATOR UTERINE 4.5 ZUMI (MISCELLANEOUS) ×5 IMPLANT
MANIPULATOR VCARE LG CRV RETR (MISCELLANEOUS) IMPLANT
MANIPULATOR VCARE SML CRV RETR (MISCELLANEOUS) IMPLANT
MANIPULATOR VCARE STD CRV RETR (MISCELLANEOUS) ×3 IMPLANT
NDL HYPO 21X1.5 SAFETY (NEEDLE) IMPLANT
NEEDLE HYPO 21X1.5 SAFETY (NEEDLE) ×5 IMPLANT
NEEDLE HYPO 22GX1.5 SAFETY (NEEDLE) ×3 IMPLANT
NEEDLE VERESS 14GA 120MM (NEEDLE) ×5 IMPLANT
NS IRRIG 500ML POUR BTL (IV SOLUTION) ×8 IMPLANT
PACK LAP CHOLECYSTECTOMY (MISCELLANEOUS) ×5 IMPLANT
PAD OB MATERNITY 4.3X12.25 (PERSONAL CARE ITEMS) ×5 IMPLANT
PAD PREP 24X41 OB/GYN DISP (PERSONAL CARE ITEMS) ×5 IMPLANT
PENCIL ELECTRO HAND CTR (MISCELLANEOUS) ×5 IMPLANT
RTRCTR C-SECT PINK 25CM LRG (MISCELLANEOUS) ×3 IMPLANT
SCISSORS METZENBAUM CVD 33 (INSTRUMENTS) ×5 IMPLANT
SCRUB CHG 4% DYNA-HEX 4OZ (MISCELLANEOUS) ×3 IMPLANT
SET CYSTO W/LG BORE CLAMP LF (SET/KITS/TRAYS/PACK) ×3 IMPLANT
SHEARS HARMONIC ACE PLUS 36CM (ENDOMECHANICALS) ×5 IMPLANT
SLEEVE ENDOPATH XCEL 5M (ENDOMECHANICALS) ×16 IMPLANT
SLEEVE PROTECTION STRL DISP (MISCELLANEOUS) ×3 IMPLANT
SOL PREP PVP 2OZ (MISCELLANEOUS) ×5
SOLUTION PREP PVP 2OZ (MISCELLANEOUS) ×1 IMPLANT
SPONGE LAP 18X18 5 PK (GAUZE/BANDAGES/DRESSINGS) ×9 IMPLANT
SPONGE VERSALON 2X2 STRL (MISCELLANEOUS) ×10
SPONGE XRAY 4X4 16PLY STRL (MISCELLANEOUS) ×3 IMPLANT
SURGILUBE 2OZ TUBE FLIPTOP (MISCELLANEOUS) ×5 IMPLANT
SUT ENDO VLOC 180-0-8IN (SUTURE) IMPLANT
SUT MNCRL 4-0 (SUTURE) ×5
SUT MNCRL 4-0 27XMFL (SUTURE) ×3
SUT MNCRL AB 4-0 PS2 18 (SUTURE) ×5 IMPLANT
SUT VIC AB 0 CT1 18XCR BRD 8 (SUTURE) ×2 IMPLANT
SUT VIC AB 0 CT1 36 (SUTURE) ×11 IMPLANT
SUT VIC AB 0 CT1 8-18 (SUTURE) ×10
SUT VIC AB 2-0 UR6 27 (SUTURE) ×5 IMPLANT
SUT VIC AB 4-0 PS2 18 (SUTURE) ×5 IMPLANT
SUTURE MNCRL 4-0 27XMF (SUTURE) ×1 IMPLANT
SYR 30ML LL (SYRINGE) ×3 IMPLANT
SYRINGE 10CC LL (SYRINGE) ×5 IMPLANT
TROCAR BLUNT TIP 12MM OMST12BT (TROCAR) IMPLANT
TROCAR ENDO BLADELESS 11MM (ENDOMECHANICALS) ×5 IMPLANT
TROCAR XCEL NON-BLD 5MMX100MML (ENDOMECHANICALS) ×5 IMPLANT
TUBING CONNECTING 10 (TUBING) ×6 IMPLANT
TUBING CONNECTING 10' (TUBING) ×3
TUBING INSUFFLATOR HEATED (MISCELLANEOUS) ×5 IMPLANT
TUBING INSUFFLATOR HI FLOW (MISCELLANEOUS) ×5 IMPLANT
WATER STERILE IRR 1000ML POUR (IV SOLUTION) ×3 IMPLANT

## 2015-11-11 NOTE — Transfer of Care (Addendum)
Immediate Anesthesia Transfer of Care Note  Patient: Alexandria Fry  Procedure(s) Performed: Procedure(s): LAPAROSCOPIC BILATERAL SALPINGECTOMY (Bilateral) LAPAROTOMY CYSTOSCOPY LAPAROSCOPIC SUPRACERVICAL HYSTERECTOMY CONVERTED TO OPEN  Patient Location: PACU  Anesthesia Type:Spinal  Level of Consciousness: awake and alert   Airway & Oxygen Therapy: Patient Spontanous Breathing and Patient connected to nasal cannula oxygen  Post-op Assessment: Report given to RN and Post -op Vital signs reviewed and stable  Post vital signs: Reviewed and stable  Last Vitals:  Vitals:   11/11/15 0918  BP: (!) 213/77  Pulse: 68  Resp: 18  Temp: (!) 38.1 C    Last Pain:  Vitals:   11/11/15 0918  TempSrc: Tympanic         Complications: No apparent anesthesia complications

## 2015-11-11 NOTE — Anesthesia Procedure Notes (Addendum)
Procedure Name: MAC Performed by: Demetrius Charity Pre-anesthesia Checklist: Patient identified, Emergency Drugs available, Suction available, Patient being monitored and Timeout performed Oxygen Delivery Method: Nasal cannula Comments: CRNA with initial laryngscopy utilizing glidescope #4.  Unable to obtain view of cords due to limited neck mobility and anterior larynx.  No change to dentition/tissue.  Dr. Kayleen Memos with subsequent attempts using Glidescope #4, bougie and fiberoptic bronchoscope.  Unable to successfully place ETT and decision made to wake patient up and proceed with spinal anesthetic.

## 2015-11-11 NOTE — Anesthesia Preprocedure Evaluation (Addendum)
Anesthesia Evaluation  Patient identified by MRN, date of birth, ID band Patient awake    Reviewed: Allergy & Precautions, NPO status , Patient's Chart, lab work & pertinent test results, reviewed documented beta blocker date and time   History of Anesthesia Complications (+) PONV, DIFFICULT AIRWAY and history of anesthetic complications  Airway Mallampati: IV  TM Distance: <3 FB   Mouth opening: Limited Mouth Opening  Dental  (+) Chipped   Pulmonary pneumonia, resolved, former smoker,     + decreased breath sounds      Cardiovascular Exercise Tolerance: Poor hypertension, Pt. on home beta blockers and Pt. on medications + Valvular Problems/Murmurs  Rhythm:Regular     Neuro/Psych negative neurological ROS  negative psych ROS   GI/Hepatic Neg liver ROS, GERD  Medicated,  Endo/Other  diabetes, Well ControlledHypothyroidism Morbid obesity  Renal/GU negative Renal ROS  Female GU complaint     Musculoskeletal negative musculoskeletal ROS (+)   Abdominal (+) + obese,   Peds negative pediatric ROS (+)  Hematology negative hematology ROS (+)   Anesthesia Other Findings   Reproductive/Obstetrics                          Anesthesia Physical  Anesthesia Plan  ASA: III  Anesthesia Plan: Spinal and General/Spinal   Post-op Pain Management:    Induction: Intravenous  Airway Management Planned: Nasal Cannula  Additional Equipment:   Intra-op Plan: Utilization of Controlled Hypotension per surrgeon request  Post-operative Plan:   Informed Consent: I have reviewed the patients History and Physical, chart, labs and discussed the procedure including the risks, benefits and alternatives for the proposed anesthesia with the patient or authorized representative who has indicated his/her understanding and acceptance.   Dental advisory given  Plan Discussed with: CRNA and Surgeon  Anesthesia  Plan Comments: (Patient has a known difficult airway, but also states that she feels her throat closes off when she lays supine.  Patient has a Hx of easy mask ventilation after induction, so we will attempt Intubation using the glidescope , boogie and fiberoptic scope, but we will wake the patient up if the laryngoscopy proves difficult, and proceed with the spinal.  Case discussed with the patient and Dr. Leonides Schanz.)      Anesthesia Quick Evaluation

## 2015-11-11 NOTE — Progress Notes (Signed)
Dr. Kayleen Memos in talking with patient and husband.  Pt. Declines claritin, states she has never had it and is Afraid what it may do to her.

## 2015-11-11 NOTE — Op Note (Signed)
Operative Note Procedure Date: 11/11/2015  Patient:  Alexandria Fry  60 y.o. 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  Normal upper abdomen.  Redundant bowel, normal appendix.   Cervix high in vagina with minimal descent of the uterus.  SPECIMEN: Uterus and bilateral fallopian tubes  COMPLICATIONS: none apparent  DISPOSITION: vital signs stable to PACU  Indication for Surgery: Alexandria Fry is a 60 y.o. Caucasian [id number] who has been being treated for abnormal uterine bleeding.  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  She began bleeding heavily after this, and ultrasound showed stripe of 1cm.  Progesterone was increased over time to a high dose - Megace 80mg  BID.  Due to this she has had refractory hemoglobin A1c rising and blood pressure increases.  It is prudent that we remove her from the progesterone to resolve these side-effects, and she asked for definitive treatment.  Risks of surgery were discussed with the patient including but not limited to: bleeding which may require transfusion or reoperation; infection which may require antibiotics; injury to bowel, bladder, ureters or other surrounding organs; need for additional procedures including laparotomy, blood clot, incisional problems and other postoperative/anesthesia complications. Written informed consent was obtained.       PROCEDURE IN DETAIL:  The patient had 5000u Heparin Sub-q and sequential compression devices applied to her lower extremities while in the preoperative area.  She was then taken to the operating room where general anesthesia was attempted to be administered via endotracheal route, however this was unsuccessful (see anesthesia notes) and regional anesthesia via spinal was successful and adequate after testing.  She was placed in the dorsal lithotomy position, and was prepped and draped in a sterile manner. A surgical time-out was performed.  A Foley catheter was inserted into her bladder and attached to constant drainage and a V-Care uterine manipulator was then advanced into the uterus and a good fit around the cervix was noted. The gloves were changed, and attention was turned to the abdomen where an umbilical incision was made with the scalpel.  A 41mm trochar was inserted in the umbilical incision using a visiport method. Opening pressure was 43mmHg, and the abdomen was insufflated to 15mg Hg carbon dioxide gas and adequate pneumoperitoneum was obtained.  A survey of the patient's pelvis and abdomen revealed the findings as mentioned above. Two 44mm ports were inserted in the lower left and right quadrants under visualization.    Due to the patient's unique airway and body habitus, minimal trendelenburg was tolerated. This plus redundant bowel limited visualization. The bilateral fallopian tubes were separated from the mesosalpinx using the Ligasure. The bilateral round ligaments were transected and anterior broad ligament divided and brought across the uterus to separate the vesicouterine peritoneum and create a bladder flap. A third 31mm trochar was inserted to allow an additional instrument for retraction.  With much effort to see the parametria employed, I did not have a safe visual aspect that allowed further laparoscopic  dissection.  Knowing the cervix was high in the vagina, the decision was made  to proceed with a laparotomy.  A Pfannensteil skin incision was made with a 10-blade, and taken down to the fascia.  The fascia was incised in the midline and then extended bilaterally. The fascia was then dissected off the underlying rectus muscles using blunt and sharp dissection. The rectus muscles were split bluntly in the midline and the peritoneum entered sharply without complication. This peritoneal incision was then extended superiorly and inferiorly with care given to prevent bowel or bladder injury. An Alexis retractor was placed into the incision, and the bowel was packed away with moist laparotomy sponges. The Ligasure was used to ligate and transect the bilateral uterine arteries, bilateral uterosacral and cardinal ligaments.  The cervix was deep in the pelvis and the angle so steep the posterior culdesac could not be visualized.  The redundant bowel could not be safely moved away from the area to place clamps distal to the cervix, thus the uterus was amputated from the cervix using electrocautery and the remaining cervix was left in situ.  The cervical stump was cauterized to prevent bleeding.  The right uterine artery was bleeding and the ligasure was used to cauterize the vessel.  Hemostasis was noted.  Evicel sealant was spread over the bilateral parametrial and pedicles for further hemostasis. The pelvis was irrigated with water and no bleeding was noted.  No intraoperative injury to surrounding organs was noted. All laparotomy sponges and instruments were removed from the abdomen. The fascia was closed in a running stitch of 0-Vicryl. 60cc of a combination of Exparel and 0.5% Marcaine was injected into the fascia in a stellate fashion. After a change of gloves, the subcutaneous layer was copiously irrigated reapproximated with 2-0 vicryl. The skin was closed with a 4-0 Vicryl subcuticular stitch and 40 of the Exparel/Marcaine mix was injected into these tissues.  The laparoscopic port sites  were closed with 4-0 monocryl.  Surgical glue was placed over all incisions.    Sponge, lap, needle, and instrument counts were correct times two. The patient was taken to the recovery area awake, and in stable condition.   All skin incisions were closed with 4-0 monocryl and covered with surgical glue. The patient tolerated the procedures well.  All instruments, needles, and sponge counts were correct x 2. The patient was taken to the recovery room in stable condition.   ---- Larey Days, MD Attending Obstetrician and Gynecologist [facility], Department of [facility]

## 2015-11-11 NOTE — Anesthesia Procedure Notes (Signed)
Spinal  Start time: 11/11/2015 11:04 AM End time: 11/11/2015 11:08 AM Staffing Anesthesiologist: Alvin Critchley Performed: anesthesiologist  Preanesthetic Checklist Completed: patient identified, site marked, surgical consent, pre-op evaluation, timeout performed, IV checked, risks and benefits discussed and monitors and equipment checked Spinal Block Patient position: sitting Prep: Betadine and site prepped and draped Patient monitoring: heart rate, cardiac monitor, continuous pulse ox and blood pressure Approach: right paramedian Location: L3-4 Injection technique: single-shot Needle Needle type: Whitacre  Needle gauge: 25 G Assessment Sensory level: T4 Additional Notes Time out called.  Patient placed in sitting position.  Back prepped and draped in sterile fashion.  A skin wheal was made in the right paramedian position in the L3-L4 interspace.  A25 G Whitacre needle was guided into the sub-arachnoid space with a return of clear, colorless CSF in all 4 positions.  13 mg of marcaine +  2mg  of tetracaine + epi 1: 200K was injected with no problem.  Patient tolerated the procedure well

## 2015-11-11 NOTE — Progress Notes (Signed)
Late entry  Postop - 6:30pm  Patient just moved into her room and resting comfortably, awake and chatty.  Denies pain, only when coughing. Does not have much sensation of lower extremities yet.  Tolerating liquids.   Nurse at bedside gathering initial vitals.  See flowsheet after entry.  Explained course of her surgery and decision to open.  Also explained expectations for inpatient stay and recovery.  Due to her high intolerance of many meds, pain control may be a challenge - hopefully she will do well with minimal meds.  Routine post op care.      ----- Larey Days, MD Attending Obstetrician and Gynecologist [facility], Department of [facility]

## 2015-11-11 NOTE — H&P (Signed)
H&P Update  PLEASE SEE PRIOR H&P  Pt was last seen in my office, and complete history and physical performed.  The surgical history has been reviewed and remains accurate without interval change. The patient was re-examined and patient's physiologic condition has not changed significantly in the last 30 days.  No new pharmacological allergies or types of therapy has been initiated.  Allergies  Allergen Reactions  . Ace Inhibitors     tachycardia  . Amoxicillin-Pot Clavulanate     REACTION: Swelling  . Ceftin [Cefuroxime Axetil]     Rash  . Diphenhydramine Hcl     REACTION: Burns skin  . Hydrochlorothiazide W-Triamterene     REACTION: Hives and GI upset  . Hydrocodone-Acetaminophen     REACTION: Headache  . Levaquin [Levofloxacin In D5w] Itching    Severe itching and flushing.  . Metformin And Related     hives  . Nitrofurantoin     REACTION: Swelling, SOB  . Oxycodone-Acetaminophen     REACTION: Headache  . Sulfamethoxazole-Trimethoprim     REACTION: Hives and swollen neck  . Sulfonamide Derivatives     REACTION: Hives, tightness in chest  . Zithromax [Azithromycin] Hives    Past Medical History:  Diagnosis Date  . Allergic rhinitis   . Difficult intubation   . GERD (gastroesophageal reflux disease)   . Hypertension   . Irregular menses    with neg endometrial biopsy 2013  . Murmur, cardiac   . Pneumonia    2014  . PONV (postoperative nausea and vomiting)   . Pre-diabetes    Past Surgical History:  Procedure Laterality Date  . CERVICAL CONE BIOPSY    . DILATATION & CURETTAGE/HYSTEROSCOPY WITH MYOSURE N/A 03/21/2015   Procedure: DILATATION & CURETTAGE/HYSTEROSCOPY, POLYPECTOMY;  Surgeon: Honor Loh Ward, MD;  Location: [facility] ORS;  Service: Gynecology;  Laterality: N/A;  . DILATION AND CURETTAGE OF UTERUS  1981   [facility]   . DILATION AND CURETTAGE OF UTERUS  06/25/2009   normal (Dr. Laurey Morale)  . VAGINAL DELIVERY     NSVD x 2    BP (!) 213/77   Pulse 68   Temp  (!) 100.5 F (38.1 C) (Tympanic)   Resp 18   LMP  (LMP Unknown)   SpO2 98%   NAD RRR no murmurs CTAB, no wheezing, resps unlabored +BS, soft, NTTP No c/c/e Pelvic exam deferred  The above history was confirmed with the patient. The condition still exists that makes this procedure necessary. Surgical plan includes Laparoscopic Hysterectomy, Bilateral Salpingectomy, possible oophorectomy as confirmed on the consent. The treatment plan remains the same, without new options for care.  The patient understands the potential benefits and risks and the consents have been signed and placed on the chart.     Larey Days, MD Attending Obstetrician Gynecologist [facility], Department of [facility]

## 2015-11-12 ENCOUNTER — Encounter: Payer: Self-pay | Admitting: Obstetrics & Gynecology

## 2015-11-12 ENCOUNTER — Encounter: Payer: Self-pay | Admitting: Family Medicine

## 2015-11-12 LAB — BASIC METABOLIC PANEL
Anion gap: 6 (ref 5–15)
BUN: 11 mg/dL (ref 6–20)
CALCIUM: 9.2 mg/dL (ref 8.9–10.3)
CHLORIDE: 108 mmol/L (ref 101–111)
CO2: 23 mmol/L (ref 22–32)
CREATININE: 0.88 mg/dL (ref 0.44–1.00)
GFR calc non Af Amer: >60 mL/min (ref >60)
Glucose, Bld: 287 mg/dL — ABNORMAL HIGH (ref 65–99)
Potassium: 4.1 mmol/L (ref 3.5–5.1)
SODIUM: 137 mmol/L (ref 135–145)

## 2015-11-12 LAB — GLUCOSE, CAPILLARY
GLUCOSE-CAPILLARY: 311 mg/dL — AB (ref 65–99)
Glucose-Capillary: 252 mg/dL — ABNORMAL HIGH (ref 65–99)
Glucose-Capillary: 253 mg/dL — ABNORMAL HIGH (ref 65–99)

## 2015-11-12 LAB — CBC
HCT: 39.6 % (ref 35.0–47.0)
HEMOGLOBIN: 13.4 g/dL (ref 12.0–16.0)
MCH: 34.4 pg — AB (ref 26.0–34.0)
MCHC: 34 g/dL (ref 32.0–36.0)
MCV: 101.1 fL — ABNORMAL HIGH (ref 80.0–100.0)
Platelets: 218 10*3/uL (ref 150–440)
RBC: 3.91 MIL/uL (ref 3.80–5.20)
RDW: 12.4 % (ref 11.5–14.5)
WBC: 12.5 10*3/uL — ABNORMAL HIGH (ref 3.6–11.0)

## 2015-11-12 LAB — HEMOGLOBIN
Creatinine, Ser: 0.87
Glucose: 164
Hemoglobin: 15.8
INSULIN: 164.3
TESTOSTERONE, FREE: 7.2
TSH: 1.15

## 2015-11-12 MED ORDER — CODEINE SULFATE 15 MG PO TABS: 15.0000 mg | ORAL_TABLET | ORAL | 0 refills | Status: DC | PRN

## 2015-11-12 MED ORDER — LIVING WELL WITH DIABETES BOOK
Freq: Once | Status: AC
  Administered 2015-11-12: 13:00:00
  Filled 2015-11-12: qty 1

## 2015-11-12 MED ORDER — IBUPROFEN 600 MG PO TABS: 600.0000 mg | ORAL_TABLET | Freq: Four times a day (QID) | ORAL | 1 refills | Status: DC | PRN

## 2015-11-12 NOTE — Progress Notes (Signed)
Pt discharged home.  Discharge instructions, prescriptions and follow up appointment given to and reviewed with pt.  Pt verbalized understanding.  Escorted by auxillary. 

## 2015-11-12 NOTE — Progress Notes (Addendum)
Inpatient Diabetes Program Recommendations  AACE/ADA: New Consensus Statement on Inpatient Glycemic Control (2015)  Target Ranges:  Prepandial:   less than 140 mg/dL      Peak postprandial:   less than 180 mg/dL (1-2 hours)      Critically ill patients:  140 - 180 mg/dL   Results for Alexandria Fry, Alexandria Fry (MRN [id number]) as of 11/12/2015 10:31  Ref. Range 11/11/2015 09:16 11/11/2015 15:08 11/11/2015 23:46 11/12/2015 03:57 11/12/2015 08:19  Glucose-Capillary Latest Ref Range: 65 - 99 mg/dL 170 (H) 270 (H) 367 (H) 311 (H) 252 (H)   Review of Glycemic Control  Diabetes history: DM2 (recent dx on 10/18/15; prior history was prediabetes) Outpatient Diabetes medications: None Current orders for Inpatient glycemic control: Novolog 0-20 units Q4H  Inpatient Diabetes Program Recommendations: Insulin - Basal: If steroids are continued as ordered, please consider ordering Lantus 10 units Q24H starting now. HgbA1C: A1C 8.3% on 10/15/15 indicating an average glucose of 192 mg/dl over the past 2-3 months. Note by Dr. Damita Dunnings on 10/18/15 indicates patient was educated on diet and asked to monitor glucose then recheck A1C in 3 months. Outpatient DM medications: At time of discharge, MD may want to consider starting oral DM medication if appropriate.   NOTE: In reviewing chart, note patient has recent dx of DM2 and was advised to change diet and monitor glucose. Patient is currently ordered Solumedrol 125 mg Q12H which is contributing to hyperglycemia.   Thanks, Barnie Alderman, RN, MSN, CDE Diabetes Coordinator Inpatient Diabetes Program [phone] (Team Pager from [place] to [place]) [phone] ([facility] office) [phone] [facility] office) [phone] [facility] office)

## 2015-11-12 NOTE — Discharge Summary (Signed)
Gynecology Physician Postoperative Discharge Summary  Patient ID: ASANA NAKAGAWA MRN: [id number] DOB/AGE: October 10, 1955 60 y.o.  Admit Date: 11/11/2015 Discharge Date: 11/12/2015  Preoperative Diagnoses: Abnormal uterine bleeding Postoperative Diagnoses: Abnormal uterine bleeding  Procedures: Procedure(s) (LRB): LAPAROSCOPIC BILATERAL SALPINGECTOMY (Bilateral) LAPAROTOMY CYSTOSCOPY LAPAROSCOPIC SUPRACERVICAL HYSTERECTOMY CONVERTED TO OPEN  CBC Latest Ref Rng & Units 11/12/2015 11/11/2015 11/07/2015  WBC 3.6 - 11.0 K/uL 12.5(H) 17.3(H) 8.6  Hemoglobin 12.0 - 16.0 g/dL 13.4 15.0 15.8  Hematocrit 35.0 - 47.0 % 39.6 43.3 46.3  Platelets 150 - 440 K/uL 218 209 206    Hospital Course:  Alexandria Fry is a 60 y.o. [id number]  admitted for scheduled surgery.  She underwent the procedures as mentioned above, her operation was uncomplicated,. For further details about surgery, please refer to the operative report. Patient's post op period was significant for elevated blood sugars after steroids administered.  Steroids were stopped and she was given sliding scale insulin to help with hypoglycemia. By time of discharge on POD#1, her pain was controlled on oral pain medications; she was ambulating, voiding without difficulty, tolerating regular diet and passing flatus. She was deemed stable for discharge to home.   Discharge Exam: Blood pressure (!) 133/58, pulse 63, temperature 98.8 F (37.1 C), temperature source Axillary, resp. rate 18, height 5\' 5"  (1.651 m), weight 107.5 kg (237 lb), SpO2 95 %. General appearance: alert and no distress  Resp: clear to auscultation bilaterally, normal respiratory effort Cardio: regular rate and rhythm  GI: soft, non-tender; bowel sounds normal; no masses, no organomegaly.  Incision: C/D/I, no erythema, no drainage noted Pelvic: scant blood on pad  Extremities: extremities normal, atraumatic, no cyanosis or edema and Homans sign is negative, no sign of  DVT  Discharged Condition: Stable  Disposition: 01-Home or Self Care  Discharge Instructions    Diet - low sodium heart healthy    Complete by:  As directed    Increase activity slowly    Complete by:  As directed        Medication List    TAKE these medications   ALREX 0.2 % Susp Generic drug:  loteprednol Apply 1 drop to eye daily as needed.   amLODipine 5 MG tablet Commonly known as:  NORVASC Take 1-2 tablets (5-10 mg total) by mouth daily.   codeine 15 MG tablet Take 1 tablet (15 mg total) by mouth every 4 (four) hours as needed for moderate pain.   gentamicin ointment 0.1 % Commonly known as:  GARAMYCIN Apply topically daily as needed. In the nose   ibuprofen 600 MG tablet Commonly known as:  ADVIL,MOTRIN Take 1 tablet (600 mg total) by mouth every 6 (six) hours as needed.   megestrol 40 MG tablet Commonly known as:  MEGACE Take 40 mg by mouth 2 (two) times daily.   metoprolol succinate 50 MG 24 hr tablet Commonly known as:  TOPROL-XL Take 1 by mouth twice a day   multivitamin tablet Take 1 tablet by mouth daily.   Vitamin D 2000 units Caps Take 2,000 Units by mouth 2 (two) times daily.      Follow-up [place], MD Follow up in 2 week(s).   Specialty:  Obstetrics and Gynecology Why:  already scheduled.   Contact information: [place] [address] [phone]           Signed:  [place] Attending [facility]

## 2015-11-12 NOTE — Discharge Instructions (Signed)
Discharge instructions:  Call office if you have any of the following: fever >101 F, chills, excessive vaginal bleeding, incision drainage or problems, leg pain or redness, or any other concerns.   Activity: Do not lift > 10 lbs for 8 weeks.  No driving for 1-2 weeks.   Please don't limit yourself in terms of routine activity.  You will be able to do most things, although they may take longer to do or be a little painful.  You can do it!  Don't be a hero, but don't be a wimp either!

## 2015-11-13 LAB — SURGICAL PATHOLOGY

## 2015-11-13 NOTE — Anesthesia Postprocedure Evaluation (Addendum)
Anesthesia Post Note  Patient: NECOLE BUEN  Procedure(s) Performed: Procedure(s) (LRB): LAPAROSCOPIC BILATERAL SALPINGECTOMY (Bilateral) LAPAROTOMY CYSTOSCOPY LAPAROSCOPIC SUPRACERVICAL HYSTERECTOMY CONVERTED TO OPEN  Patient location during evaluation: PACU Anesthesia Type: Spinal Level of consciousness: awake and alert and oriented Pain management: pain level controlled Vital Signs Assessment: post-procedure vital signs reviewed and stable Respiratory status: spontaneous breathing Cardiovascular status: blood pressure returned to baseline Anesthetic complications: no Comments: Attempted GOT with the Hx of difficult intubation in the past.  Used assistance of glidescope, and fiberoptic scope, but still unable to successfully intubate the trachea.  Elected to wake the patient up and do a regional block as the surgeon believed the case could be done in an expedient fashion.   Regional block was successful and the patient tolerated the procedure well.  Did not start with the regional block as the patient stated that she feels like her throat closes up at home when she lies supine.                                                          Last Vitals:  Vitals:   11/12/15 0740 11/12/15 1158  BP: 130/61 (!) 133/58  Pulse: (!) 57 63  Resp: 18 18  Temp: 37.1 C 37.1 C    Last Pain:  Vitals:   11/12/15 1158  TempSrc: Axillary  PainSc:                  Sherrill Mckamie

## 2015-11-18 ENCOUNTER — Encounter: Payer: Self-pay | Admitting: Family Medicine

## 2015-12-02 ENCOUNTER — Encounter: Payer: Self-pay | Admitting: Family Medicine

## 2015-12-05 ENCOUNTER — Encounter: Payer: Self-pay | Admitting: Family Medicine

## 2015-12-06 ENCOUNTER — Telehealth: Payer: Self-pay | Admitting: Family Medicine

## 2015-12-06 DIAGNOSIS — E281 Androgen excess: Secondary | ICD-10-CM

## 2015-12-06 NOTE — Telephone Encounter (Signed)
See mychart message.  Let me know if I need to put in a new referral.  Thanks.

## 2015-12-06 NOTE — Telephone Encounter (Signed)
Yes please put another referral in epic. Please put in note pt wants to wait til recovered from previous surgery so we will know she is not ready to schedule until 11/20  Thanks

## 2015-12-08 NOTE — Telephone Encounter (Signed)
I put in the referral with extra note about scheduling. Thanks.

## 2016-01-01 ENCOUNTER — Other Ambulatory Visit: Payer: Self-pay | Admitting: Family Medicine

## 2016-01-01 DIAGNOSIS — Z1231 Encounter for screening mammogram for malignant neoplasm of breast: Secondary | ICD-10-CM

## 2016-01-07 ENCOUNTER — Ambulatory Visit
Admission: RE | Admit: 2016-01-07 | Discharge: 2016-01-07 | Disposition: A | Discharge status: Home / Self Care | Payer: Managed Care, Other (non HMO) | Source: Ambulatory Visit | Attending: Family Medicine | Admitting: Family Medicine

## 2016-01-07 DIAGNOSIS — Z1231 Encounter for screening mammogram for malignant neoplasm of breast: Secondary | ICD-10-CM | POA: Diagnosis not present

## 2016-01-16 ENCOUNTER — Encounter: Payer: Self-pay | Admitting: Family Medicine

## 2016-02-03 ENCOUNTER — Encounter: Payer: Self-pay | Admitting: Family Medicine

## 2016-02-17 ENCOUNTER — Telehealth: Payer: Self-pay | Admitting: Family Medicine

## 2016-02-17 NOTE — Telephone Encounter (Signed)
Call pt.  Would be due to recheck thyroid u/s 02/2016.  She has been referred to endo.  Is endo taking care of this?  If so, I will not order.  If endo isn't handling then, then I need to order. Please let me know.  Thanks.

## 2016-02-18 NOTE — Telephone Encounter (Signed)
Patient advised and states that if endo does not order the thyroid US, she will contact us.

## 2016-02-18 NOTE — Telephone Encounter (Signed)
I'm okay with awaiting endo to get their input.  I just don't want this to get dropped.  I'll await endo input.  Please notify pt about this.  Thanks.

## 2016-02-18 NOTE — Telephone Encounter (Signed)
Patient has appt with endo on 03/12/15.  Is it ok to get that appt and see if they will order it or should she proceed with you ordering it?

## 2016-02-22 ENCOUNTER — Other Ambulatory Visit: Payer: Self-pay | Admitting: Family Medicine

## 2016-03-16 ENCOUNTER — Other Ambulatory Visit: Payer: Self-pay | Admitting: Family Medicine

## 2016-03-16 DIAGNOSIS — E119 Type 2 diabetes mellitus without complications: Secondary | ICD-10-CM

## 2016-03-16 DIAGNOSIS — E559 Vitamin D deficiency, unspecified: Secondary | ICD-10-CM

## 2016-03-19 ENCOUNTER — Other Ambulatory Visit (INDEPENDENT_AMBULATORY_CARE_PROVIDER_SITE_OTHER): Payer: Managed Care, Other (non HMO)

## 2016-03-19 DIAGNOSIS — E559 Vitamin D deficiency, unspecified: Secondary | ICD-10-CM | POA: Diagnosis not present

## 2016-03-19 DIAGNOSIS — E119 Type 2 diabetes mellitus without complications: Secondary | ICD-10-CM

## 2016-03-19 LAB — VITAMIN D 25 HYDROXY (VIT D DEFICIENCY, FRACTURES): VITD: 16.61 ng/mL — AB (ref 30.00–100.00)

## 2016-03-19 LAB — COMPREHENSIVE METABOLIC PANEL
ALBUMIN: 4.1 g/dL (ref 3.5–5.2)
ALT: 29 U/L (ref 0–35)
AST: 25 U/L (ref 0–37)
Alkaline Phosphatase: 76 U/L (ref 39–117)
BUN: 14 mg/dL (ref 6–23)
CHLORIDE: 101 meq/L (ref 96–112)
CO2: 31 meq/L (ref 19–32)
CREATININE: 0.8 mg/dL (ref 0.40–1.20)
Calcium: 9.6 mg/dL (ref 8.4–10.5)
GFR: 77.8 mL/min (ref >60.00)
Glucose, Bld: 157 mg/dL — ABNORMAL HIGH (ref 70–99)
Potassium: 4.3 mEq/L (ref 3.5–5.1)
SODIUM: 138 meq/L (ref 135–145)
Total Bilirubin: 0.6 mg/dL (ref 0.2–1.2)
Total Protein: 6.6 g/dL (ref 6.0–8.3)

## 2016-03-19 LAB — LIPID PANEL
CHOL/HDL RATIO: 5
Cholesterol: 197 mg/dL (ref 0–200)
HDL: 37.1 mg/dL — ABNORMAL LOW (ref >39.00)
NONHDL: 160.01
Triglycerides: 307 mg/dL — ABNORMAL HIGH (ref 0.0–149.0)
VLDL: 61.4 mg/dL — ABNORMAL HIGH (ref 0.0–40.0)

## 2016-03-19 LAB — HEMOGLOBIN A1C: HEMOGLOBIN A1C: 6.8 % — AB (ref 4.6–6.5)

## 2016-03-19 LAB — LDL CHOLESTEROL, DIRECT: Direct LDL: 94 mg/dL

## 2016-03-19 LAB — TSH: TSH: 1.45 u[IU]/mL (ref 0.35–4.50)

## 2016-03-23 ENCOUNTER — Ambulatory Visit (INDEPENDENT_AMBULATORY_CARE_PROVIDER_SITE_OTHER): Payer: Managed Care, Other (non HMO) | Admitting: Family Medicine

## 2016-03-23 ENCOUNTER — Encounter: Payer: Self-pay | Admitting: Family Medicine

## 2016-03-23 VITALS — BP 150/78 | HR 85 | Temp 98.3°F | Ht 65.0 in | Wt 231.2 lb

## 2016-03-23 DIAGNOSIS — E042 Nontoxic multinodular goiter: Secondary | ICD-10-CM

## 2016-03-23 DIAGNOSIS — Z0000 Encounter for general adult medical examination without abnormal findings: Secondary | ICD-10-CM

## 2016-03-23 DIAGNOSIS — E663 Overweight: Secondary | ICD-10-CM

## 2016-03-23 DIAGNOSIS — E559 Vitamin D deficiency, unspecified: Secondary | ICD-10-CM

## 2016-03-23 DIAGNOSIS — E119 Type 2 diabetes mellitus without complications: Secondary | ICD-10-CM

## 2016-03-23 MED ORDER — METOPROLOL SUCCINATE ER 100 MG PO TB24: 100.0000 mg | ORAL_TABLET | Freq: Every day | ORAL | 3 refills | Status: DC

## 2016-03-23 MED ORDER — METOPROLOL SUCCINATE ER 100 MG PO TB24: 100.0000 mg | ORAL_TABLET | Freq: Every day | ORAL | 0 refills | Status: DC

## 2016-03-23 NOTE — Progress Notes (Signed)
Pre visit review using our clinic review tool, if applicable. No additional management support is needed unless otherwise documented below in the visit note. 

## 2016-03-23 NOTE — Patient Instructions (Addendum)
Check with your insurance to see if they will cover the shingles shot. I'll await your eye clinic notes.  Recheck ultrasound in 01/2017.  Recheck A1c in about 4 months.  Lab visit.  Take care.  Glad to see you.  Keep working on your weight.   Update me as needed.

## 2016-03-23 NOTE — Progress Notes (Signed)
CPE- See plan.  Routine anticipatory guidance given to patient.  See health maintenance. Tetanus 2011 Flu 2017 Shingles and PNA not due now. PNA prev done 2014 Pap per gyn.   DXA not due Mammogram 12/2015 Living will d/w pt. Husband designated if patient were incapacitated.  Diet and exercise d/w pt. She is trying to get more exercise, recently working more to get more exercise.  She is working on weight loss after surgery.    We talked about her allergy list and it may be possible to get her tested at the allergy clinic later on.  We tabled it for now.  It is likely that opiates do cause HA, amlodipine did cause edema, PCN allergy is true and correct.    PCOS/hirsutism.  The plan is to continue working on weight, not change meds at this point.  She has seen endocrinology in the meantime. Endocrine note reviewed with patient.  Diabetes:  No meds A1c much improved.   Off megace.  D/w pt.   Feet problems: no Blood Sugars averaging: not checked.   eye exam within last year: f/u pending for 05/2016.  Not a candidate for ACE, d/w pt.  No need for MALB.   Thyroid ultrasound results prev d/w pt.  Reasonable to recheck ~01/2017.    Vit D def, she'll restart vit D, had been off replacement recently.    PMH and SH reviewed  Meds, vitals, and allergies reviewed.   ROS: Per HPI.  Unless specifically indicated otherwise in HPI, the patient denies:  General: fever. Eyes: acute vision changes ENT: sore throat Cardiovascular: chest pain Respiratory: SOB GI: vomiting GU: dysuria Musculoskeletal: acute back pain Derm: acute rash Neuro: acute motor dysfunction Psych: worsening mood Endocrine: polydipsia Heme: bleeding Allergy: hayfever  GEN: nad, alert and oriented HEENT: mucous membranes moist NECK: supple w/o LA CV: rrr. PULM: ctab, no inc wob ABD: soft, +bs EXT: no edema SKIN: no acute rash  Diabetic foot exam: Normal inspection No skin breakdown No calluses  Normal DP  pulses Normal sensation to light touch and monofilament Nails normal

## 2016-03-25 NOTE — Assessment & Plan Note (Signed)
A1c much improved. Labs discussed with patient. Continue work on diet and exercise. Recheck later this year. She agrees.

## 2016-03-25 NOTE — Assessment & Plan Note (Signed)
Continue work on diet and exercise. Discussed with patient.

## 2016-03-25 NOTE — Assessment & Plan Note (Signed)
Discussed with patient. We can recheck ultrasound late in 2018. She agrees.

## 2016-03-25 NOTE — Assessment & Plan Note (Signed)
Tetanus 2011 Flu 2017 Shingles and PNA not due now. PNA prev done 2014 Pap per gyn.   DXA not due Mammogram 12/2015 Living will d/w pt. Husband designated if patient were incapacitated.  Diet and exercise d/w pt. She is trying to get more exercise, recently working more to get more exercise.  She is working on weight loss after surgery.

## 2016-03-25 NOTE — Assessment & Plan Note (Signed)
Restart vitamin D. Discussed with patient.

## 2016-06-12 ENCOUNTER — Ambulatory Visit (INDEPENDENT_AMBULATORY_CARE_PROVIDER_SITE_OTHER): Payer: Managed Care, Other (non HMO) | Admitting: Family Medicine

## 2016-06-12 ENCOUNTER — Encounter: Payer: Self-pay | Admitting: Family Medicine

## 2016-06-12 VITALS — BP 172/82 | HR 79 | Temp 99.1°F | Wt 231.5 lb

## 2016-06-12 DIAGNOSIS — R300 Dysuria: Secondary | ICD-10-CM | POA: Diagnosis not present

## 2016-06-12 LAB — POC URINALSYSI DIPSTICK (AUTOMATED)
BILIRUBIN UA: NEGATIVE
Glucose, UA: NEGATIVE
Ketones, UA: NEGATIVE
LEUKOCYTES UA: NEGATIVE
Nitrite, UA: NEGATIVE
Protein, UA: NEGATIVE
RBC UA: NEGATIVE
Spec Grav, UA: 1.025 (ref 1.010–1.025)
UROBILINOGEN UA: 0.2 U/dL
pH, UA: 6 (ref 5.0–8.0)

## 2016-06-12 NOTE — Progress Notes (Signed)
Pre visit review using our clinic review tool, if applicable. No additional management support is needed unless otherwise documented below in the visit note. 

## 2016-06-12 NOTE — Patient Instructions (Signed)
Use monistat cream if you have more external irritation.  We'll contact you with your lab report. Drink plenty of water in the meantime.   Take care.  Glad to see you.

## 2016-06-12 NOTE — Progress Notes (Signed)
She has a long RV trip planned.  She wanted treatment prior to leaving town.    dysuria: she has clearly had UTIs in the past without typical symptoms.   duration of symptoms: a few days abdominal pain: some occ  fevers:no back pain:no vomiting:no She has been drinking a lot of water in the meantime.  BP was 132/71 at home today.  She prev had some external irritation but that is better compared to earlier this week.  No discharge.    Meds, vitals, and allergies reviewed.   Per HPI unless specifically indicated in ROS section   GEN: nad, alert and oriented HEENT: mucous membranes moist NECK: supple CV: rrr.  PULM: ctab, no inc wob ABD: soft, +bs, suprapubic area not tender EXT: no edema BACK: no CVA pain

## 2016-06-13 ENCOUNTER — Encounter: Payer: Self-pay | Admitting: Family Medicine

## 2016-06-13 DIAGNOSIS — R300 Dysuria: Secondary | ICD-10-CM | POA: Insufficient documentation

## 2016-06-13 NOTE — Assessment & Plan Note (Signed)
She could've had some urinary symptoms related to external irritation. She is nontoxic. Given her multiple antibiotic contraindications it is reasonable to check urine culture in the meantime and have her continue to drink plenty of fluids. See after visit summary. Okay for outpatient follow-up. She agrees.

## 2016-06-16 LAB — URINE CULTURE

## 2016-06-17 ENCOUNTER — Other Ambulatory Visit: Payer: Self-pay | Admitting: Family Medicine

## 2016-06-17 MED ORDER — FOSFOMYCIN TROMETHAMINE 3 G PO PACK: 3.0000 g | PACK | Freq: Once | ORAL | 0 refills | Status: AC

## 2016-06-18 ENCOUNTER — Other Ambulatory Visit: Payer: Self-pay | Admitting: Family Medicine

## 2016-06-18 DIAGNOSIS — Z87440 Personal history of urinary (tract) infections: Secondary | ICD-10-CM

## 2016-06-19 ENCOUNTER — Other Ambulatory Visit (INDEPENDENT_AMBULATORY_CARE_PROVIDER_SITE_OTHER): Payer: Managed Care, Other (non HMO)

## 2016-06-19 DIAGNOSIS — Z87440 Personal history of urinary (tract) infections: Secondary | ICD-10-CM

## 2016-06-22 ENCOUNTER — Encounter: Payer: Self-pay | Admitting: Family Medicine

## 2016-06-22 ENCOUNTER — Telehealth: Payer: Self-pay | Admitting: Family Medicine

## 2016-06-22 LAB — URINE CULTURE

## 2016-06-22 NOTE — Telephone Encounter (Signed)
Patient returned Lugene's call. °

## 2016-06-23 NOTE — Telephone Encounter (Signed)
Patient returned Lugene's call. Patient said Lugene asked her to call back with name of pharmacy to send  [facility] and it's at [facility] already.

## 2016-07-21 ENCOUNTER — Other Ambulatory Visit: Payer: Managed Care, Other (non HMO)

## 2016-08-14 ENCOUNTER — Other Ambulatory Visit: Payer: Managed Care, Other (non HMO)

## 2016-11-27 ENCOUNTER — Other Ambulatory Visit (INDEPENDENT_AMBULATORY_CARE_PROVIDER_SITE_OTHER): Payer: Managed Care, Other (non HMO)

## 2016-11-27 ENCOUNTER — Ambulatory Visit (INDEPENDENT_AMBULATORY_CARE_PROVIDER_SITE_OTHER): Payer: Managed Care, Other (non HMO)

## 2016-11-27 DIAGNOSIS — E559 Vitamin D deficiency, unspecified: Secondary | ICD-10-CM

## 2016-11-27 DIAGNOSIS — Z23 Encounter for immunization: Secondary | ICD-10-CM | POA: Diagnosis not present

## 2016-11-27 DIAGNOSIS — E119 Type 2 diabetes mellitus without complications: Secondary | ICD-10-CM | POA: Diagnosis not present

## 2016-11-27 LAB — VITAMIN D 25 HYDROXY (VIT D DEFICIENCY, FRACTURES): VITD: 23.77 ng/mL — ABNORMAL LOW (ref 30.00–100.00)

## 2016-11-27 LAB — HEMOGLOBIN A1C: Hgb A1c MFr Bld: 6.1 % (ref 4.6–6.5)

## 2016-11-29 ENCOUNTER — Other Ambulatory Visit: Payer: Self-pay | Admitting: Family Medicine

## 2016-11-29 ENCOUNTER — Encounter: Payer: Self-pay | Admitting: Family Medicine

## 2016-11-29 DIAGNOSIS — E559 Vitamin D deficiency, unspecified: Secondary | ICD-10-CM

## 2016-11-29 DIAGNOSIS — Z8639 Personal history of other endocrine, nutritional and metabolic disease: Secondary | ICD-10-CM

## 2016-12-03 ENCOUNTER — Other Ambulatory Visit: Payer: Managed Care, Other (non HMO)

## 2016-12-03 ENCOUNTER — Ambulatory Visit: Payer: Managed Care, Other (non HMO) | Admitting: Family Medicine

## 2017-01-26 ENCOUNTER — Other Ambulatory Visit: Payer: Self-pay | Admitting: Family Medicine

## 2017-01-26 DIAGNOSIS — E041 Nontoxic single thyroid nodule: Secondary | ICD-10-CM

## 2017-01-26 NOTE — Progress Notes (Signed)
Due for f/u thyroid u/s.  Ordered.  Thanks.

## 2017-01-27 NOTE — Progress Notes (Signed)
Patient notified as instructed by telephone and verbalized understanding. Advised patient that one of the referral coordinators will be in touch with her to get this set up.

## 2017-02-04 ENCOUNTER — Other Ambulatory Visit: Payer: Self-pay | Admitting: Family Medicine

## 2017-02-04 DIAGNOSIS — E041 Nontoxic single thyroid nodule: Secondary | ICD-10-CM

## 2017-02-05 ENCOUNTER — Other Ambulatory Visit: Payer: Self-pay | Admitting: Family Medicine

## 2017-03-01 ENCOUNTER — Ambulatory Visit
Admission: RE | Admit: 2017-03-01 | Discharge: 2017-03-01 | Disposition: A | Discharge status: Home / Self Care | Payer: Managed Care, Other (non HMO) | Source: Ambulatory Visit | Attending: Family Medicine | Admitting: Family Medicine

## 2017-03-01 DIAGNOSIS — E041 Nontoxic single thyroid nodule: Secondary | ICD-10-CM | POA: Diagnosis present

## 2017-03-01 DIAGNOSIS — E042 Nontoxic multinodular goiter: Secondary | ICD-10-CM | POA: Insufficient documentation

## 2017-03-10 ENCOUNTER — Other Ambulatory Visit: Payer: Self-pay | Admitting: Family Medicine

## 2017-03-24 ENCOUNTER — Other Ambulatory Visit (INDEPENDENT_AMBULATORY_CARE_PROVIDER_SITE_OTHER): Payer: Managed Care, Other (non HMO)

## 2017-03-24 DIAGNOSIS — Z8639 Personal history of other endocrine, nutritional and metabolic disease: Secondary | ICD-10-CM | POA: Diagnosis not present

## 2017-03-24 DIAGNOSIS — E559 Vitamin D deficiency, unspecified: Secondary | ICD-10-CM | POA: Diagnosis not present

## 2017-03-24 LAB — LIPID PANEL
CHOL/HDL RATIO: 6
CHOLESTEROL: 221 mg/dL — AB (ref 0–200)
HDL: 40 mg/dL (ref >39.00)
NONHDL: 180.79
TRIGLYCERIDES: 393 mg/dL — AB (ref 0.0–149.0)
VLDL: 78.6 mg/dL — AB (ref 0.0–40.0)

## 2017-03-24 LAB — LDL CHOLESTEROL, DIRECT: Direct LDL: 97 mg/dL

## 2017-03-24 LAB — COMPREHENSIVE METABOLIC PANEL
ALT: 29 U/L (ref 0–35)
AST: 28 U/L (ref 0–37)
Albumin: 4.1 g/dL (ref 3.5–5.2)
Alkaline Phosphatase: 60 U/L (ref 39–117)
BILIRUBIN TOTAL: 0.6 mg/dL (ref 0.2–1.2)
BUN: 17 mg/dL (ref 6–23)
CALCIUM: 9.3 mg/dL (ref 8.4–10.5)
CHLORIDE: 99 meq/L (ref 96–112)
CO2: 30 meq/L (ref 19–32)
Creatinine, Ser: 0.77 mg/dL (ref 0.40–1.20)
GFR: 81.03 mL/min (ref >60.00)
GLUCOSE: 172 mg/dL — AB (ref 70–99)
POTASSIUM: 4.1 meq/L (ref 3.5–5.1)
Sodium: 135 mEq/L (ref 135–145)
Total Protein: 7.5 g/dL (ref 6.0–8.3)

## 2017-03-24 LAB — VITAMIN D 25 HYDROXY (VIT D DEFICIENCY, FRACTURES): VITD: 20.5 ng/mL — ABNORMAL LOW (ref 30.00–100.00)

## 2017-03-24 LAB — HEMOGLOBIN A1C: Hgb A1c MFr Bld: 6.6 % — ABNORMAL HIGH (ref 4.6–6.5)

## 2017-03-26 ENCOUNTER — Encounter: Payer: Managed Care, Other (non HMO) | Admitting: Family Medicine

## 2017-03-29 ENCOUNTER — Encounter: Payer: Self-pay | Admitting: Family Medicine

## 2017-03-29 ENCOUNTER — Ambulatory Visit (INDEPENDENT_AMBULATORY_CARE_PROVIDER_SITE_OTHER): Payer: Managed Care, Other (non HMO) | Admitting: Family Medicine

## 2017-03-29 VITALS — BP 176/82 | HR 69 | Temp 98.9°F | Ht 65.0 in | Wt 238.2 lb

## 2017-03-29 DIAGNOSIS — L989 Disorder of the skin and subcutaneous tissue, unspecified: Secondary | ICD-10-CM

## 2017-03-29 DIAGNOSIS — E119 Type 2 diabetes mellitus without complications: Secondary | ICD-10-CM

## 2017-03-29 DIAGNOSIS — I10 Essential (primary) hypertension: Secondary | ICD-10-CM

## 2017-03-29 DIAGNOSIS — Z7189 Other specified counseling: Secondary | ICD-10-CM

## 2017-03-29 DIAGNOSIS — Z0000 Encounter for general adult medical examination without abnormal findings: Secondary | ICD-10-CM

## 2017-03-29 DIAGNOSIS — E559 Vitamin D deficiency, unspecified: Secondary | ICD-10-CM

## 2017-03-29 DIAGNOSIS — E042 Nontoxic multinodular goiter: Secondary | ICD-10-CM

## 2017-03-29 MED ORDER — METOPROLOL SUCCINATE ER 50 MG PO TB24: 150.0000 mg | ORAL_TABLET | Freq: Every day | ORAL | 3 refills | Status: DC

## 2017-03-29 NOTE — Patient Instructions (Addendum)
Check with your insurance to see if they will cover the shingrix shot. Continue metoprolol and add on clonidine for blood pressure. Try to get back to exercising and limit salt intake.  Keep drinking plenty of water.  Rosaria Ferries will call about your referral. Take care.  Glad to see you.  Recheck labs prior to a visit in about months.

## 2017-03-29 NOTE — Progress Notes (Signed)
CPE- See plan.  Routine anticipatory guidance given to patient.  See health maintenance.  The possibility exists that previously documented standard health maintenance information may have been brought forward from a previous encounter into this note.  If needed, that same information has been updated to reflect the current situation based on today's encounter.    Tetanus 2011 Flu 2018 Shingles d/w pt.  PNA prev done 2014 Pap per gyn. I'll defer.   DXA not due She'll call about follow up colonoscopy.  D/w pt.   Mammogram 12/2015, she'll call about f/u.   Living will d/w pt. Husband designated if patient were incapacitated.  Diet and exercise d/w pt.Exercise limited by foot pain and weather changes.  Thyroid u/s d/w pt.  1. No new or enlarging thyroid nodules. 2. All discretely measured thyroid nodules are unchanged compared to (at least) the 02/2015 examination and none meet imaging criteria to recommend biopsy or continued dedicated follow-up. No imaging f/u needed on this.  We can recheck a TSH later on.  Low vit D, d/w pt.  She hadn't been on replacement consistently.  Labs d/w pt. she will restart vitamin D.  Diabetes:  No meds Hypoglycemic episodes: not checked.  No sx Hyperglycemic episodes:  not checked.  No sx Feet problems: no tingling.  See below.   Blood Sugars averaging: not checked.   eye exam within last year: yes  Hypertension:    Using medication without problems or lightheadedness: yes Chest pain with exertion:no Edema: some BLE edema with salt loading.   Short of breath: no She has been getting more salt in her diet.  D/w pt.   She had some nosebleeds since her BP was elevated.  She had to pack her nose a few times.   D/w pt about her meds and allergies.   Recheck BP still elevated, d/w pt.   PMH and SH reviewed  Meds, vitals, and allergies reviewed.   ROS: Per HPI.  Unless specifically indicated otherwise in HPI, the patient denies:  General:  fever. Eyes: acute vision changes ENT: sore throat Cardiovascular: chest pain Respiratory: SOB GI: vomiting GU: dysuria Musculoskeletal: acute back pain Derm: acute rash Neuro: acute motor dysfunction Psych: worsening mood Endocrine: polydipsia Heme: bleeding Allergy: hayfever  GEN: nad, alert and oriented HEENT: mucous membranes moist NECK: supple w/o LA CV: rrr. PULM: ctab, no inc wob ABD: soft, +bs EXT: no edema SKIN: no acute rash but SKs noted.    Diabetic foot exam: Mult SKs and corns.   No skin breakdown No calluses  Normal DP pulses Normal sensation to light touch and monofilament Nails normal

## 2017-03-30 MED ORDER — CLONIDINE HCL 0.1 MG PO TABS: 0.1000 mg | ORAL_TABLET | Freq: Two times a day (BID) | ORAL | 3 refills | Status: DC

## 2017-03-30 MED ORDER — METOPROLOL SUCCINATE ER 100 MG PO TB24: 100.0000 mg | ORAL_TABLET | Freq: Every day | ORAL | 3 refills | Status: DC

## 2017-03-30 NOTE — Assessment & Plan Note (Signed)
Restart replacement and check a level later on.

## 2017-03-30 NOTE — Assessment & Plan Note (Signed)
The initial plan was to increase her beta-blocker.  Given the bradycardia she noted on home checks it may be more reasonable to try a low dose of clonidine.  She has multiple med intolerances.  Discussed with patient. See orders and mychart message.

## 2017-03-30 NOTE — Assessment & Plan Note (Signed)
Living will d/w pt.  Husband designated if patient were incapacitated.  

## 2017-03-30 NOTE — Assessment & Plan Note (Signed)
No need for medications at this point.  Labs discussed with patient.  Need to work on diet and exercise.  Recheck in about 3 months.

## 2017-03-30 NOTE — Assessment & Plan Note (Signed)
Tetanus 2011 Flu 2018 Shingles d/w pt.  PNA prev done 2014 Pap per gyn. I'll defer.   DXA not due She'll call about follow up colonoscopy.  D/w pt.   Mammogram 12/2015, she'll call about f/u.   Living will d/w pt. Husband designated if patient were incapacitated.  Diet and exercise d/w pt.Exercise limited by foot pain and weather changes.

## 2017-03-30 NOTE — Assessment & Plan Note (Signed)
Thyroid u/s d/w pt.  1. No new or enlarging thyroid nodules. 2. All discretely measured thyroid nodules are unchanged compared to (at least) the 02/2015 examination and none meet imaging criteria to recommend biopsy or continued dedicated follow-up. No imaging f/u needed on this.  We can recheck a TSH later on.

## 2017-04-08 ENCOUNTER — Other Ambulatory Visit: Payer: Self-pay | Admitting: Family Medicine

## 2017-04-08 MED ORDER — METOPROLOL SUCCINATE ER 50 MG PO TB24: 100.0000 mg | ORAL_TABLET | Freq: Every day | ORAL | 3 refills | Status: DC

## 2017-04-26 ENCOUNTER — Ambulatory Visit (INDEPENDENT_AMBULATORY_CARE_PROVIDER_SITE_OTHER): Payer: Managed Care, Other (non HMO) | Admitting: Podiatry

## 2017-04-26 ENCOUNTER — Encounter: Payer: Self-pay | Admitting: Podiatry

## 2017-04-26 VITALS — BP 189/100 | HR 63

## 2017-04-26 DIAGNOSIS — M79674 Pain in right toe(s): Secondary | ICD-10-CM

## 2017-04-26 DIAGNOSIS — L608 Other nail disorders: Secondary | ICD-10-CM | POA: Diagnosis not present

## 2017-04-26 DIAGNOSIS — M79675 Pain in left toe(s): Secondary | ICD-10-CM

## 2017-04-26 DIAGNOSIS — E119 Type 2 diabetes mellitus without complications: Secondary | ICD-10-CM

## 2017-04-26 DIAGNOSIS — L84 Corns and callosities: Secondary | ICD-10-CM

## 2017-04-26 DIAGNOSIS — B351 Tinea unguium: Secondary | ICD-10-CM | POA: Diagnosis not present

## 2017-04-26 NOTE — Progress Notes (Signed)
   Subjective:    Patient ID: Alexandria Fry, female    DOB: 1956/01/24, 62 y.o.   MRN: [id number]  HPIthis patient presents the office with chief complaint of a painful fifth corn fourth toe left foot and painful nails both feet.  She says she has used acid on the corn on the fourth toe left foot.  She also has pain and discomfort at the tips of her toes walking and wearing her shoes.  She says she is unable to self treat. She presents the office today for evaluation and treatment of her painful feet.    Review of Systems  All other systems reviewed and are negative.      Objective:   Physical Exam General Appearance  Alert, conversant and in no acute stress.  Vascular  Dorsalis pedis and posterior tibial  pulses are palpable  bilaterally.  Capillary return is within normal limits  bilaterally. Temperature is within normal limits  bilaterally.  Neurologic  Senn-Weinstein monofilament wire test within normal limits  bilaterally. Muscle power within normal limits bilaterally.  Nails Thick disfigured discolored nails with subungual debris  from hallux to fifth toes bilaterally. No evidence of bacterial infection or drainage bilaterally. Pincer toenails  B/L.  Orthopedic  No limitations of motion of motion feet .  No crepitus or effusions noted.  No bony pathology or digital deformities noted.HAV  B/L  Skin  normotropic skin with no porokeratosis noted bilaterally.  Corn 4th toe left foot.         Assessment & Fry:  Pincer Nails  Corn 4th toe left foot.  IE  Debride pincer toenails.  Debride corn 4th left   Gardiner Barefoot DPM

## 2017-04-27 ENCOUNTER — Encounter: Payer: Self-pay | Admitting: Family Medicine

## 2017-04-28 ENCOUNTER — Other Ambulatory Visit: Payer: Self-pay | Admitting: Family Medicine

## 2017-04-28 MED ORDER — METOPROLOL SUCCINATE ER 50 MG PO TB24: 100.0000 mg | ORAL_TABLET | Freq: Every day | ORAL | Status: DC

## 2017-04-28 MED ORDER — SPIRONOLACTONE 25 MG PO TABS: 25.0000 mg | ORAL_TABLET | Freq: Every day | ORAL | 3 refills | Status: DC

## 2017-06-03 ENCOUNTER — Encounter: Payer: Self-pay | Admitting: Family Medicine

## 2017-06-03 ENCOUNTER — Ambulatory Visit (INDEPENDENT_AMBULATORY_CARE_PROVIDER_SITE_OTHER): Payer: Managed Care, Other (non HMO) | Admitting: Family Medicine

## 2017-06-03 VITALS — BP 136/84 | HR 79 | Temp 98.6°F | Wt 232.2 lb

## 2017-06-03 DIAGNOSIS — I10 Essential (primary) hypertension: Secondary | ICD-10-CM | POA: Diagnosis not present

## 2017-06-03 LAB — BASIC METABOLIC PANEL WITH GFR
BUN: 15 mg/dL (ref 6–23)
CO2: 29 meq/L (ref 19–32)
Calcium: 10 mg/dL (ref 8.4–10.5)
Chloride: 99 meq/L (ref 96–112)
Creatinine, Ser: 0.92 mg/dL (ref 0.40–1.20)
GFR: 65.94 mL/min (ref >60.00)
Glucose, Bld: 211 mg/dL — ABNORMAL HIGH (ref 70–99)
Potassium: 4.3 meq/L (ref 3.5–5.1)
Sodium: 136 meq/L (ref 135–145)

## 2017-06-03 NOTE — Patient Instructions (Addendum)
Go to the lab on the way out.  We'll contact you with your lab report. Take care.  Glad to see you.  Don't change your meds for now.  Update me as needed.   

## 2017-06-03 NOTE — Progress Notes (Signed)
Hypertension:    Using medication without problems or lightheadedness: yes Chest pain with exertion:no Edema: occ/trace but not at time of exam today.    Short of breath:no but she is deconditioned at baseline.  Her BP is controlled today.  She has some variation when she is rushing around and in a hurry but better in general.    She is able to tolerate her meds as they are.    She is trying to get her foot addressed with podiatry so she'll be able to walk more for exercise.  D/w pt.    ROS: Per HPI unless specifically indicated in ROS section   Meds, vitals, and allergies reviewed.   GEN: nad, alert and oriented HEENT: mucous membranes moist NECK: supple w/o LA CV: rrr PULM: ctab, no inc wob ABD: soft, +bs EXT: no edema SKIN: well perfused.

## 2017-06-06 NOTE — Assessment & Plan Note (Signed)
Reasonable control.  No change in meds.  Recheck routine labs today.  Continue work on diet and exercise as tolerated.  All discussed with patient.  All questions answered.  She agrees.

## 2017-06-07 ENCOUNTER — Encounter: Payer: Self-pay | Admitting: *Deleted

## 2017-06-14 ENCOUNTER — Encounter: Payer: Self-pay | Admitting: Family Medicine

## 2017-06-18 ENCOUNTER — Ambulatory Visit: Payer: Managed Care, Other (non HMO) | Admitting: Podiatry

## 2017-06-19 ENCOUNTER — Encounter: Payer: Self-pay | Admitting: Family Medicine

## 2017-06-24 ENCOUNTER — Other Ambulatory Visit: Payer: Self-pay | Admitting: Family Medicine

## 2017-06-24 MED ORDER — METOPROLOL SUCCINATE ER 50 MG PO TB24: 50.0000 mg | ORAL_TABLET | Freq: Every day | ORAL | Status: DC

## 2017-06-28 ENCOUNTER — Other Ambulatory Visit: Payer: Managed Care, Other (non HMO)

## 2017-07-29 ENCOUNTER — Ambulatory Visit: Payer: Self-pay | Admitting: Podiatry

## 2017-08-02 ENCOUNTER — Ambulatory Visit: Payer: Self-pay | Admitting: Podiatry

## 2017-08-09 ENCOUNTER — Ambulatory Visit (INDEPENDENT_AMBULATORY_CARE_PROVIDER_SITE_OTHER): Payer: Managed Care, Other (non HMO) | Admitting: Podiatry

## 2017-08-09 ENCOUNTER — Encounter: Payer: Self-pay | Admitting: Podiatry

## 2017-08-09 DIAGNOSIS — M79674 Pain in right toe(s): Secondary | ICD-10-CM

## 2017-08-09 DIAGNOSIS — M79675 Pain in left toe(s): Secondary | ICD-10-CM | POA: Diagnosis not present

## 2017-08-09 DIAGNOSIS — L608 Other nail disorders: Secondary | ICD-10-CM

## 2017-08-09 DIAGNOSIS — B351 Tinea unguium: Secondary | ICD-10-CM

## 2017-08-09 NOTE — Progress Notes (Signed)
Complaint:  Visit Type: Patient returns to my office for continued preventative foot care services. Complaint: Patient states" my nails have grown long and thick and become painful to walk and wear shoes" Patient has been diagnosed with DM with no foot complications. The patient presents for preventative foot care services. No changes to ROS  Podiatric Exam: Vascular: dorsalis pedis and posterior tibial pulses are palpable bilateral. Capillary return is immediate. Temperature gradient is WNL. Skin turgor WNL  Sensorium: Normal Semmes Weinstein monofilament test. Normal tactile sensation bilaterally. Nail Exam: Pt has thick disfigured discolored nails with subungual debris noted bilateral entire nail hallux through fifth toenails.  Pincer nails 1,2  B/L. Ulcer Exam: There is no evidence of ulcer or pre-ulcerative changes or infection. Orthopedic Exam: Muscle tone and strength are WNL. No limitations in general ROM. No crepitus or effusions noted. Foot type and digits show no abnormalities. HAV  B/L. Skin: No Porokeratosis. No infection or ulcers  Diagnosis:  Onychomycosis, Pincer nails  , Pain in right toe, pain in left toes  Treatment & Plan Procedures and Treatment: Consent by patient was obtained for treatment procedures.   Debridement of mycotic and hypertrophic toenails, 1 through 5 bilateral and clearing of subungual debris. No ulceration, no infection noted.  Return Visit-Office Procedure: Patient instructed to return to the office for a follow up visit 3 months for continued evaluation and treatment.    Gardiner Barefoot DPM

## 2017-08-31 ENCOUNTER — Other Ambulatory Visit: Payer: Self-pay | Admitting: Family Medicine

## 2017-08-31 DIAGNOSIS — Z1231 Encounter for screening mammogram for malignant neoplasm of breast: Secondary | ICD-10-CM

## 2017-09-20 ENCOUNTER — Ambulatory Visit
Admission: RE | Admit: 2017-09-20 | Discharge: 2017-09-20 | Disposition: A | Discharge status: Home / Self Care | Payer: Managed Care, Other (non HMO) | Source: Ambulatory Visit | Attending: Family Medicine | Admitting: Family Medicine

## 2017-09-20 DIAGNOSIS — Z1231 Encounter for screening mammogram for malignant neoplasm of breast: Secondary | ICD-10-CM | POA: Diagnosis not present

## 2017-10-25 LAB — HM DIABETES EYE EXAM

## 2017-11-08 ENCOUNTER — Ambulatory Visit (INDEPENDENT_AMBULATORY_CARE_PROVIDER_SITE_OTHER): Payer: Managed Care, Other (non HMO) | Admitting: Podiatry

## 2017-11-08 ENCOUNTER — Encounter: Payer: Self-pay | Admitting: Podiatry

## 2017-11-08 DIAGNOSIS — B351 Tinea unguium: Secondary | ICD-10-CM | POA: Diagnosis not present

## 2017-11-08 DIAGNOSIS — M79675 Pain in left toe(s): Secondary | ICD-10-CM | POA: Diagnosis not present

## 2017-11-08 DIAGNOSIS — M79674 Pain in right toe(s): Secondary | ICD-10-CM

## 2017-11-08 DIAGNOSIS — E119 Type 2 diabetes mellitus without complications: Secondary | ICD-10-CM | POA: Diagnosis not present

## 2017-11-08 DIAGNOSIS — L608 Other nail disorders: Secondary | ICD-10-CM

## 2017-11-08 NOTE — Progress Notes (Signed)
Complaint:  Visit Type: Patient returns to my office for continued preventative foot care services. Complaint: Patient states" my nails have grown long and thick and become painful to walk and wear shoes" Patient has been diagnosed with DM with no foot complications. The patient presents for preventative foot care services. No changes to ROS  Podiatric Exam: Vascular: dorsalis pedis and posterior tibial pulses are palpable bilateral. Capillary return is immediate. Temperature gradient is WNL. Skin turgor WNL  Sensorium: Normal Semmes Weinstein monofilament test. Normal tactile sensation bilaterally. Nail Exam: Pt has thick disfigured discolored nails with subungual debris noted bilateral entire nail hallux through fifth toenails.  Pincer nails 1,2  B/L. Ulcer Exam: There is no evidence of ulcer or pre-ulcerative changes or infection. Orthopedic Exam: Muscle tone and strength are WNL. No limitations in general ROM. No crepitus or effusions noted. Foot type and digits show no abnormalities. HAV  B/L. Skin: No Porokeratosis. No infection or ulcers  Diagnosis:  Onychomycosis, Pincer nails  , Pain in right toe, pain in left toes  Treatment & Plan Procedures and Treatment: Consent by patient was obtained for treatment procedures.   Debridement of mycotic and hypertrophic toenails, 1 through 5 bilateral and clearing of subungual debris. No ulceration, no infection noted. To consider nail surgery in future. Patient is not diabetic. Return Visit-Office Procedure: Patient instructed to return to the office for a follow up visit 3 months for continued evaluation and treatment.    Gardiner Barefoot DPM

## 2017-11-11 LAB — HM PAP SMEAR: HM Pap smear: NEGATIVE

## 2017-11-30 ENCOUNTER — Encounter: Payer: Self-pay | Admitting: Family Medicine

## 2017-12-06 ENCOUNTER — Encounter: Payer: Self-pay | Admitting: Family Medicine

## 2017-12-20 ENCOUNTER — Encounter: Payer: Self-pay | Admitting: Family Medicine

## 2017-12-21 ENCOUNTER — Other Ambulatory Visit: Payer: Self-pay | Admitting: Family Medicine

## 2017-12-21 DIAGNOSIS — E042 Nontoxic multinodular goiter: Secondary | ICD-10-CM

## 2018-01-17 ENCOUNTER — Encounter: Payer: Self-pay | Admitting: *Deleted

## 2018-01-18 ENCOUNTER — Encounter
Admission: RE | Disposition: A | Discharge status: Home / Self Care | Payer: Self-pay | Source: Ambulatory Visit | Attending: Gastroenterology

## 2018-01-18 ENCOUNTER — Ambulatory Visit: Payer: Managed Care, Other (non HMO) | Admitting: Anesthesiology

## 2018-01-18 ENCOUNTER — Encounter: Payer: Self-pay | Admitting: Anesthesiology

## 2018-01-18 ENCOUNTER — Ambulatory Visit
Admission: RE | Admit: 2018-01-18 | Discharge: 2018-01-18 | Disposition: A | Discharge status: Home / Self Care | Payer: Managed Care, Other (non HMO) | Source: Ambulatory Visit | Attending: Gastroenterology | Admitting: Gastroenterology

## 2018-01-18 DIAGNOSIS — D123 Benign neoplasm of transverse colon: Secondary | ICD-10-CM | POA: Diagnosis not present

## 2018-01-18 DIAGNOSIS — Z79899 Other long term (current) drug therapy: Secondary | ICD-10-CM | POA: Diagnosis not present

## 2018-01-18 DIAGNOSIS — D125 Benign neoplasm of sigmoid colon: Secondary | ICD-10-CM | POA: Insufficient documentation

## 2018-01-18 DIAGNOSIS — I10 Essential (primary) hypertension: Secondary | ICD-10-CM | POA: Insufficient documentation

## 2018-01-18 DIAGNOSIS — Z880 Allergy status to penicillin: Secondary | ICD-10-CM | POA: Insufficient documentation

## 2018-01-18 DIAGNOSIS — Z6838 Body mass index (BMI) 38.0-38.9, adult: Secondary | ICD-10-CM | POA: Diagnosis not present

## 2018-01-18 DIAGNOSIS — K644 Residual hemorrhoidal skin tags: Secondary | ICD-10-CM | POA: Diagnosis not present

## 2018-01-18 DIAGNOSIS — Z881 Allergy status to other antibiotic agents status: Secondary | ICD-10-CM | POA: Diagnosis not present

## 2018-01-18 DIAGNOSIS — Z1211 Encounter for screening for malignant neoplasm of colon: Secondary | ICD-10-CM | POA: Diagnosis not present

## 2018-01-18 DIAGNOSIS — Z800 Family history of malignant neoplasm of digestive organs: Secondary | ICD-10-CM | POA: Diagnosis present

## 2018-01-18 DIAGNOSIS — E119 Type 2 diabetes mellitus without complications: Secondary | ICD-10-CM | POA: Insufficient documentation

## 2018-01-18 DIAGNOSIS — Z888 Allergy status to other drugs, medicaments and biological substances status: Secondary | ICD-10-CM | POA: Insufficient documentation

## 2018-01-18 DIAGNOSIS — Z885 Allergy status to narcotic agent status: Secondary | ICD-10-CM | POA: Insufficient documentation

## 2018-01-18 DIAGNOSIS — K621 Rectal polyp: Secondary | ICD-10-CM | POA: Insufficient documentation

## 2018-01-18 DIAGNOSIS — D120 Benign neoplasm of cecum: Secondary | ICD-10-CM | POA: Insufficient documentation

## 2018-01-18 DIAGNOSIS — K648 Other hemorrhoids: Secondary | ICD-10-CM | POA: Diagnosis not present

## 2018-01-18 DIAGNOSIS — Z86010 Personal history of colonic polyps: Secondary | ICD-10-CM | POA: Diagnosis not present

## 2018-01-18 DIAGNOSIS — K5730 Diverticulosis of large intestine without perforation or abscess without bleeding: Secondary | ICD-10-CM | POA: Insufficient documentation

## 2018-01-18 DIAGNOSIS — Z882 Allergy status to sulfonamides status: Secondary | ICD-10-CM | POA: Insufficient documentation

## 2018-01-18 HISTORY — DX: Fatty (change of) liver, not elsewhere classified: K76.0

## 2018-01-18 HISTORY — DX: Excessive and frequent menstruation with regular cycle: N92.0

## 2018-01-18 HISTORY — PX: COLONOSCOPY WITH PROPOFOL: SHX5780

## 2018-01-18 SURGERY — COLONOSCOPY WITH PROPOFOL
Anesthesia: General

## 2018-01-18 MED ORDER — FENTANYL CITRATE (PF) 100 MCG/2ML IJ SOLN
INTRAMUSCULAR | Status: DC | PRN
  Administered 2018-01-18: 50 ug via INTRAVENOUS

## 2018-01-18 MED ORDER — MIDAZOLAM HCL 2 MG/2ML IJ SOLN
INTRAMUSCULAR | Status: AC
  Filled 2018-01-18: qty 2

## 2018-01-18 MED ORDER — PROPOFOL 500 MG/50ML IV EMUL
INTRAVENOUS | Status: AC
  Filled 2018-01-18: qty 50

## 2018-01-18 MED ORDER — SODIUM CHLORIDE 0.9 % IV SOLN
INTRAVENOUS | Status: DC
  Administered 2018-01-18: 13:00:00 via INTRAVENOUS

## 2018-01-18 MED ORDER — DEXAMETHASONE SODIUM PHOSPHATE 10 MG/ML IJ SOLN
INTRAMUSCULAR | Status: AC
  Filled 2018-01-18: qty 1

## 2018-01-18 MED ORDER — ONDANSETRON HCL 4 MG/2ML IJ SOLN
INTRAMUSCULAR | Status: DC | PRN
  Administered 2018-01-18: 4 mg via INTRAVENOUS

## 2018-01-18 MED ORDER — PROPOFOL 500 MG/50ML IV EMUL
INTRAVENOUS | Status: DC | PRN
  Administered 2018-01-18: 120 ug/kg/min via INTRAVENOUS

## 2018-01-18 MED ORDER — FENTANYL CITRATE (PF) 100 MCG/2ML IJ SOLN
INTRAMUSCULAR | Status: AC
  Filled 2018-01-18: qty 2

## 2018-01-18 MED ORDER — DEXAMETHASONE SODIUM PHOSPHATE 4 MG/ML IJ SOLN
INTRAMUSCULAR | Status: DC | PRN
  Administered 2018-01-18: 5 mg via INTRAVENOUS

## 2018-01-18 MED ORDER — MIDAZOLAM HCL 2 MG/2ML IJ SOLN
INTRAMUSCULAR | Status: DC | PRN
  Administered 2018-01-18: 2 mg via INTRAVENOUS

## 2018-01-18 MED ORDER — ONDANSETRON HCL 4 MG/2ML IJ SOLN
INTRAMUSCULAR | Status: AC
  Filled 2018-01-18: qty 2

## 2018-01-18 NOTE — Anesthesia Procedure Notes (Signed)
Performed by: Vaughan Sine Pre-anesthesia Checklist: Patient identified, Emergency Drugs available, Suction available, Patient being monitored and Timeout performed Patient Re-evaluated:Patient Re-evaluated prior to induction Oxygen Delivery Method: Simple face mask Preoxygenation: Pre-oxygenation with 100% oxygen Induction Type: IV induction Ventilation: Oral airway inserted - appropriate to patient size Placement Confirmation: CO2 detector and positive ETCO2

## 2018-01-18 NOTE — Anesthesia Preprocedure Evaluation (Signed)
Anesthesia Evaluation  Patient identified by MRN, date of birth, ID band Patient awake    Reviewed: Allergy & Precautions, NPO status , Patient's Chart, lab work & pertinent test results, reviewed documented beta blocker date and time   History of Anesthesia Complications (+) PONV, DIFFICULT AIRWAY and history of anesthetic complications  Airway Mallampati: IV  TM Distance: <3 FB Neck ROM: limited  Mouth opening: Limited Mouth Opening  Dental  (+) Caps, Dental Advidsory Given, Missing, Teeth Intact   Pulmonary neg shortness of breath, neg sleep apnea, pneumonia, resolved, neg COPD, neg recent URI, former smoker,           Cardiovascular Exercise Tolerance: Poor hypertension, Pt. on home beta blockers and Pt. on medications (-) angina(-) CAD, (-) Past MI, (-) Cardiac Stents and (-) CABG (-) dysrhythmias + Valvular Problems/Murmurs      Neuro/Psych negative neurological ROS  negative psych ROS   GI/Hepatic Neg liver ROS, GERD  Medicated,  Endo/Other  diabetes, Well ControlledHypothyroidism Morbid obesity  Renal/GU negative Renal ROS  Female GU complaint     Musculoskeletal negative musculoskeletal ROS (+)   Abdominal (+) + obese,   Peds negative pediatric ROS (+)  Hematology negative hematology ROS (+)   Anesthesia Other Findings Past Medical History: No date: Allergic rhinitis No date: Diabetes mellitus without complication (HCC) No date: Difficult intubation No date: Fatty liver No date: GERD (gastroesophageal reflux disease) No date: Hypertension No date: Irregular menses     Comment:  with neg endometrial biopsy 2013 No date: Menorrhagia No date: Murmur, cardiac No date: Pneumonia     Comment:  2014 No date: PONV (postoperative nausea and vomiting)   Reproductive/Obstetrics negative OB ROS                             Anesthesia Physical  Anesthesia Plan  ASA:  III  Anesthesia Plan: General   Post-op Pain Management:    Induction: Intravenous  PONV Risk Score and Plan: 4 or greater and Propofol infusion and TIVA  Airway Management Planned: Nasal Cannula  Additional Equipment:   Intra-op Plan: Utilization of Controlled Hypotension per surrgeon request  Post-operative Plan:   Informed Consent: I have reviewed the patients History and Physical, chart, labs and discussed the procedure including the risks, benefits and alternatives for the proposed anesthesia with the patient or authorized representative who has indicated his/her understanding and acceptance.   Dental advisory given  Plan Discussed with: CRNA and Surgeon  Anesthesia Plan Comments: (Patient has a known difficult airway, but also states that she feels her throat closes off when she lays supine.  Patient has a Hx of easy mask ventilation after induction, so we will attempt Intubation using the glidescope , boogie and fiberoptic scope, but we will wake the patient up if the laryngoscopy proves difficult, and proceed with the spinal.  Case discussed with the patient and Dr. Leonides Schanz.)        Anesthesia Quick Evaluation

## 2018-01-18 NOTE — Anesthesia Post-op Follow-up Note (Signed)
Anesthesia QCDR form completed.        

## 2018-01-18 NOTE — Transfer of Care (Signed)
Immediate Anesthesia Transfer of Care Note  Patient: Alexandria Fry  Procedure(s) Performed: COLONOSCOPY WITH PROPOFOL (N/A )  Patient Location: PACU  Anesthesia Type:General  Level of Consciousness: awake and sedated  Airway & Oxygen Therapy: Patient Spontanous Breathing and Patient connected to nasal cannula oxygen  Post-op Assessment: Report given to RN and Post -op Vital signs reviewed and stable  Post vital signs: Reviewed  Last Vitals:  Vitals Value Taken Time  BP    Temp    Pulse    Resp    SpO2      Last Pain:  Vitals:   01/18/18 1241  TempSrc: Tympanic  PainSc: 3          Complications: No apparent anesthesia complications

## 2018-01-18 NOTE — H&P (Addendum)
Outpatient short stay form Pre-procedure 01/18/2018 1:12 PM  Lollie Sails MD  Primary Physician: Dr. Elsie Stain  Reason for visit: Colonoscopy  History of present illness: Patient is a 62 year old female presenting today for colonoscopy.  Her last colonoscopy was 10/11/2009 with a finding of 2 tubular adenomas at that time.  1 of these was over 10 mm in size.  She tolerated her prep well.  She takes no aspirin or blood thinning agent.  He was due for repeat colonoscopy in 2014 however apparently was in the hospital at that time with pneumonia.  This is her first colonoscopy since 2011.  There is a family history of colon cancer primary relative, mother.  Patient also has that personal history of adenomatous colon polyps.  She denies any abdominal pain rectal bleeding or diarrhea with the exception of overall episodes over the past year or so of some fecal incontinence.  This usually occurs when she has a looser stool.  Also will occasionally see a flash of blood on the toilet paper.  Discussed using a single dose of Citrucel daily.  Have also recommended that after her procedure she follow-up in the office and we may have other options for her if the incontinence becomes more of an issue.    Current Facility-Administered Medications:  .  0.9 %  sodium chloride infusion, , Intravenous, Continuous, Lollie Sails, MD  Medications Prior to Admission  Medication Sig Dispense Refill Last Dose  . Bacitracin-Polymyxin B (POLYSPORIN) 500-10000 UNIT/GM OINT    Past Week at Unknown time  . Cholecalciferol (VITAMIN D) 2000 UNITS CAPS Take 2,000 Units by mouth 2 (two) times daily.    Past Week at Unknown time  . loteprednol (ALREX) 0.2 % SUSP INSTILL 1 DROP IN THE AFFECTED EYE(S) TWICE DAILY AS NEEDED.   Past Month at Unknown time  . metoprolol succinate (TOPROL-XL) 50 MG 24 hr tablet Take 1 tablet (50 mg total) by mouth daily. Take with or immediately following a meal.   01/17/2018 at Unknown  time  . Misc Natural Product Nasal (PONARIS NA)    01/16/2018 at Unknown time  . spironolactone (ALDACTONE) 25 MG tablet Take 1 tablet (25 mg total) by mouth daily. 90 tablet 3 01/17/2018 at Unknown time  . amLODipine (NORVASC) 5 MG tablet TAKE 1-2 TABLETS (5-10 MG TOTAL) BY MOUTH DAILY.   Not Taking at Unknown time  . megestrol (MEGACE) 40 MG tablet Take 40 mg by mouth daily.   Not Taking at Unknown time     Allergies  Allergen Reactions  . Penicillins Other (See Comments)    Tongue swells   . Sulfa Antibiotics Other (See Comments)    Tongue swells Augmenting,ampicillin,bactrium ds  . Ace Inhibitors Other (See Comments)    Would avoid.  She has h/o lip swelling with other meds.   . Acetaminophen Other (See Comments)  . Amlodipine Other (See Comments)    edema  . Amoxicillin-Pot Clavulanate     REACTION: Swelling  . Angiotensin Receptor Blockers Other (See Comments)    Would avoid.  She has h/o lip swelling with other meds.   . Ceftin [Cefuroxime Axetil]     Rash  . Clonidine Derivatives Other (See Comments)    bradycardia  . Diphenhydramine Hcl     Benadryl cream causes local skin irritation.  Would avoid med.  She can tolerate zyrtec.   Marland Kitchen Fentanyl Other (See Comments)    She felt diffusely awful while on med  . Hydrochlorothiazide  Other (See Comments)  . Hydrochlorothiazide W-Triamterene     Possible hives and GI upset  . Hydrocodone-Acetaminophen     She felt diffusely awful while on med  . Levaquin [Levofloxacin In D5w] Itching    Severe itching and flushing.  . Metformin And Related     hives  . Nitrofurantoin Other (See Comments)    rash  . Oxycodone Other (See Comments)  . Oxycodone-Acetaminophen     She felt diffusely awful while on med  . Sulfamethoxazole-Trimethoprim     REACTION: Hives and swollen neck  . Sulfonamide Derivatives     REACTION: Hives, tightness in chest  . Trimethoprim Other (See Comments)  . Zithromax [Azithromycin] Hives     Past  Medical History:  Diagnosis Date  . Allergic rhinitis   . Diabetes mellitus without complication ([place])   . Difficult intubation   . Fatty liver   . GERD (gastroesophageal reflux disease)   . Hypertension   . Irregular menses    with neg endometrial biopsy 2013  . Menorrhagia   . Murmur, cardiac   . Pneumonia    2014  . PONV (postoperative nausea and vomiting)     Review of systems:      Physical Exam    Heart and lungs: Regular rate and rhythm without rub or gallop, lungs are bilaterally clear.    HEENT: Normocephalic atraumatic eyes are anicteric    Other:    Pertinant exam for procedure: Soft nontender nondistended bowel sounds positive normoactive    Planned proceedures: Colonoscopy and indicated procedures. I have discussed the risks benefits and complications of procedures to include not limited to bleeding, infection, perforation and the risk of sedation and the patient wishes to proceed.    Lollie Sails, MD Gastroenterology 01/18/2018  1:12 PM

## 2018-01-19 ENCOUNTER — Encounter: Payer: Self-pay | Admitting: Gastroenterology

## 2018-01-20 LAB — SURGICAL PATHOLOGY

## 2018-01-20 NOTE — Anesthesia Postprocedure Evaluation (Signed)
Anesthesia Post Note  Patient: LAIKEN SANDY  Procedure(s) Performed: COLONOSCOPY WITH PROPOFOL (N/A )  Patient location during evaluation: Endoscopy Anesthesia Type: General Level of consciousness: awake and alert Pain management: pain level controlled Vital Signs Assessment: post-procedure vital signs reviewed and stable Respiratory status: spontaneous breathing, nonlabored ventilation, respiratory function stable and patient connected to nasal cannula oxygen Cardiovascular status: blood pressure returned to baseline and stable Postop Assessment: no apparent nausea or vomiting Anesthetic complications: no     Last Vitals:  Vitals:   01/18/18 1459 01/18/18 1509  BP: (!) 159/83 100/78  Pulse: 79 72  Resp: 11 18  Temp:    SpO2: 96% 95%    Last Pain:  Vitals:   01/19/18 0750  TempSrc:   PainSc: 0-No pain                 Martha Clan

## 2018-01-20 NOTE — Op Note (Addendum)
[facility] Gastroenterology Patient Name: Alexandria Fry Procedure Date: 01/18/2018 1:26 PM MRN: [id number] Account #: [id number] Date of Birth: 05-25-1955 Admit Type: Outpatient Age: 62 Room: [facility] ENDO ROOM 3 Gender: Female Note Status: Finalized Procedure:            Colonoscopy Indications:          Family history of colon cancer in a first-degree                        relative, Personal history of colonic polyps Providers:            Lollie Sails, MD Referring MD:         Flint Melter. Damita Dunnings (Referring MD) Medicines:            Monitored Anesthesia Care Complications:        No immediate complications. Procedure:            Pre-Anesthesia Assessment:                       - ASA Grade Assessment: III - A patient with severe                        systemic disease.                       After obtaining informed consent, the colonoscope was                        passed under direct vision. Throughout the procedure,                        the patient's blood pressure, pulse, and oxygen                        saturations were monitored continuously. The                        Colonoscope was introduced through the anus and                        advanced to the the cecum, identified by appendiceal                        orifice and ileocecal valve. The colonoscopy was                        performed without difficulty. The patient tolerated the                        procedure well. The quality of the bowel preparation                        was fair. Findings:      Multiple small-mouthed diverticula were found in the sigmoid colon and       descending colon.      A 4 mm polyp was found in the proximal sigmoid colon. The polyp was       sessile. The polyp was removed with a cold snare. Resection and       retrieval were complete.      A 3 mm polyp was found  in the distal transverse colon. The polyp was       sessile. The polyp was removed with a cold  biopsy forceps. Resection and       retrieval were complete.      A 5 mm polyp was found in the cecum. The polyp was semi-pedunculated.       The polyp was removed with a cold snare. Resection and retrieval were       complete.      Biopsies for histology were taken with a cold forceps from the right       colon and left colon for evaluation of microscopic colitis.      Two sessile polyps were found in the rectum. The polyps were 1 to 2 mm       in size. These polyps were removed with a cold biopsy forceps. Resection       and retrieval were complete.      Hemorrhoids, small internal/external were found on perianal exam.      The digital rectal exam was normal otherwise. Impression:           - Preparation of the colon was fair.                       - Diverticulosis in the sigmoid colon and in the                        descending colon.                       - One 4 mm polyp in the proximal sigmoid colon, removed                        with a cold snare. Resected and retrieved.                       - One 3 mm polyp in the distal transverse colon,                        removed with a cold biopsy forceps. Resected and                        retrieved.                       - One 5 mm polyp in the cecum, removed with a cold                        snare. Resected and retrieved.                       - Two 1 to 2 mm polyps in the rectum, removed with a                        cold biopsy forceps. Resected and retrieved.                       - Hemorrhoids found on perianal exam.                       - Biopsies were taken with a cold forceps from the  right colon and left colon for evaluation of                        microscopic colitis. Recommendation:       - Discharge patient to home. Procedure Code(s):    --- Professional ---                       [phone], Colonoscopy, flexible; with removal of tumor(s),                        polyp(s), or other lesion(s) by  snare technique                       45380, 82, Colonoscopy, flexible; with biopsy, single                        or multiple Diagnosis Code(s):    --- Professional ---                       K64.9, Unspecified hemorrhoids                       D12.5, Benign neoplasm of sigmoid colon                       D12.3, Benign neoplasm of transverse colon (hepatic                        flexure or splenic flexure)                       D12.0, Benign neoplasm of cecum                       K62.1, Rectal polyp                       Z80.0, Family history of malignant neoplasm of                        digestive organs                       Z86.010, Personal history of colonic polyps                       K57.30, Diverticulosis of large intestine without                        perforation or abscess without bleeding CPT copyright 2018 American Medical Association. All rights reserved. The codes documented in this report are preliminary and upon coder review may  be revised to meet current compliance requirements. Lollie Sails, MD 01/18/2018 2:37:43 PM This report has been signed electronically. Number of Addenda: 0 Note Initiated On: 01/18/2018 1:26 PM Scope Withdrawal Time: 0 hours 17 minutes 33 seconds  Total Procedure Duration: 0 hours 34 minutes 39 seconds       [facility]

## 2018-03-04 ENCOUNTER — Other Ambulatory Visit: Payer: Self-pay | Admitting: Endocrinology

## 2018-03-04 DIAGNOSIS — E049 Nontoxic goiter, unspecified: Secondary | ICD-10-CM

## 2018-03-06 ENCOUNTER — Encounter: Payer: Self-pay | Admitting: Family Medicine

## 2018-03-14 ENCOUNTER — Ambulatory Visit
Admission: RE | Admit: 2018-03-14 | Discharge: 2018-03-14 | Disposition: A | Discharge status: Home / Self Care | Payer: Managed Care, Other (non HMO) | Source: Ambulatory Visit | Attending: Endocrinology | Admitting: Endocrinology

## 2018-03-14 DIAGNOSIS — E049 Nontoxic goiter, unspecified: Secondary | ICD-10-CM

## 2018-03-24 ENCOUNTER — Other Ambulatory Visit (INDEPENDENT_AMBULATORY_CARE_PROVIDER_SITE_OTHER): Payer: Managed Care, Other (non HMO)

## 2018-03-24 DIAGNOSIS — E119 Type 2 diabetes mellitus without complications: Secondary | ICD-10-CM | POA: Diagnosis not present

## 2018-03-24 DIAGNOSIS — E559 Vitamin D deficiency, unspecified: Secondary | ICD-10-CM

## 2018-03-24 LAB — TSH: TSH: 1.34 u[IU]/mL (ref 0.35–4.50)

## 2018-03-24 LAB — HEMOGLOBIN A1C: Hgb A1c MFr Bld: 8.5 % — ABNORMAL HIGH (ref 4.6–6.5)

## 2018-03-24 LAB — VITAMIN D 25 HYDROXY (VIT D DEFICIENCY, FRACTURES): VITD: 25.73 ng/mL — ABNORMAL LOW (ref 30.00–100.00)

## 2018-03-31 ENCOUNTER — Encounter: Payer: Self-pay | Admitting: Family Medicine

## 2018-03-31 ENCOUNTER — Ambulatory Visit (INDEPENDENT_AMBULATORY_CARE_PROVIDER_SITE_OTHER): Payer: Managed Care, Other (non HMO) | Admitting: Family Medicine

## 2018-03-31 VITALS — BP 150/76 | HR 95 | Temp 98.8°F | Ht 65.0 in | Wt 229.1 lb

## 2018-03-31 DIAGNOSIS — Z889 Allergy status to unspecified drugs, medicaments and biological substances status: Secondary | ICD-10-CM

## 2018-03-31 DIAGNOSIS — Z7189 Other specified counseling: Secondary | ICD-10-CM

## 2018-03-31 DIAGNOSIS — Z0000 Encounter for general adult medical examination without abnormal findings: Secondary | ICD-10-CM

## 2018-03-31 DIAGNOSIS — E119 Type 2 diabetes mellitus without complications: Secondary | ICD-10-CM

## 2018-03-31 DIAGNOSIS — M79606 Pain in leg, unspecified: Secondary | ICD-10-CM

## 2018-03-31 DIAGNOSIS — E559 Vitamin D deficiency, unspecified: Secondary | ICD-10-CM

## 2018-03-31 DIAGNOSIS — I10 Essential (primary) hypertension: Secondary | ICD-10-CM

## 2018-03-31 LAB — HM DIABETES EYE EXAM

## 2018-03-31 MED ORDER — SPIRONOLACTONE 25 MG PO TABS: 25.0000 mg | ORAL_TABLET | Freq: Every day | ORAL | 3 refills | Status: DC

## 2018-03-31 MED ORDER — METOPROLOL SUCCINATE ER 50 MG PO TB24: 50.0000 mg | ORAL_TABLET | Freq: Every day | ORAL | 3 refills | Status: DC

## 2018-03-31 NOTE — Progress Notes (Signed)
CPE- See plan.  Routine anticipatory guidance given to patient.  See health maintenance.  The possibility exists that previously documented standard health maintenance information may have been brought forward from a previous encounter into this note.  If needed, that same information has been updated to reflect the current situation based on today's encounter.    Tetanus 2011 Flu 2019 Shingles d/w pt. Out of stock.  PNA prev done 2014 Pap per gyn.I'll defer.   DXA not due Colonoscopy 2019 Mammogram 2019 Living will d/w pt. Husband designated if patient were incapacitated.  Diet and exercise d/w pt.  We talked about allergy eval given her med allergy hx. ordered.  Hip pain.  She tripped in 12/2017, fell on her knees.  Then she had left groin lymph node enlargement after a presumed viral infection/cold in early 01/2018.  LA sig better but still with L hip pain.  No pain sitting.  Pain with standing some of the time.  Can radiate down to the L foot, with a burning pain.     Diabetes:  Using medications without difficulties: yes Hypoglycemic episodes: no sx Hyperglycemic episodes: no sx Feet problems: no Blood Sugars averaging: not checked.   eye exam within last year: 2019 ([place]) A1c up to 8.5.   We talked about diet and exercise along with her labs.  Hypertension:    Using medication without problems or lightheadedness: yes Chest pain with exertion:no Edema:no Short of breath: not usually, only attributed to deconditioning.  BP was 130/73 at home, prior to OV.    B arm and leg rash last week, not on the trunk.  Minimally itchy. Clearly better in the meantime.  No rash on the hands at any point.    Vit D deficiency.  She is now back on vit D, d/w pt.    PMH and SH reviewed  Meds, vitals, and allergies reviewed.   ROS: Per HPI.  Unless specifically indicated otherwise in HPI, the patient denies:  General: fever. Eyes: acute vision changes ENT: sore  throat Cardiovascular: chest pain Respiratory: SOB GI: vomiting GU: dysuria Musculoskeletal: acute back pain Derm: acute rash Neuro: acute motor dysfunction Psych: worsening mood Endocrine: polydipsia Heme: bleeding Allergy: hayfever  GEN: nad, alert and oriented HEENT: mucous membranes moist NECK: supple w/o LA CV: rrr. PULM: ctab, no inc wob ABD: soft, +bs EXT: no edema SKIN: no acute rash L SLR neg.

## 2018-03-31 NOTE — Patient Instructions (Addendum)
We make arrangements for referrals, extra imaging, and other appointments based on the urgency of the situation. Referrals are handled based on the clinical situation, not in the order that they are placed. If you do not see one of our referral coordinators on the way out of the clinic today, then you should expect a call in the next 1 to 2 weeks. We work diligently to process all referrals as quickly as possible.    Check with your insurance to see if they will cover the shingrix shot.   Work on diet and exercise and recheck labs before a visit in about 3 months.  Take care.  Glad to see you.

## 2018-04-05 ENCOUNTER — Encounter: Payer: Self-pay | Admitting: Family Medicine

## 2018-04-10 ENCOUNTER — Other Ambulatory Visit: Payer: Self-pay | Admitting: Family Medicine

## 2018-04-10 DIAGNOSIS — M79606 Pain in leg, unspecified: Secondary | ICD-10-CM | POA: Insufficient documentation

## 2018-04-10 DIAGNOSIS — Z889 Allergy status to unspecified drugs, medicaments and biological substances status: Secondary | ICD-10-CM | POA: Insufficient documentation

## 2018-04-10 NOTE — Assessment & Plan Note (Signed)
She describes a burning pain in her left leg that sounds typical for sciatica.  Her straight leg raise is negative.  Strength and sensation are still grossly intact in the bilateral lower extremities.  Reasonable to continue walking for exercise in the meantime and if she is getting worse then let me know.  The previous lymphadenopathy resolved.  Routine cautions given to patient.  She agrees.

## 2018-04-10 NOTE — Assessment & Plan Note (Signed)
Discussed with patient about options.  I want her to work on diet and exercise and recheck labs before a visit in about 3 months.  She agrees.

## 2018-04-10 NOTE — Assessment & Plan Note (Signed)
Was controlled at home prior to office visit.  She may have a whitecoat component.  No change in meds at this point.  She agrees.

## 2018-04-10 NOTE — Assessment & Plan Note (Signed)
Living will d/w pt.  Husband designated if patient were incapacitated.  

## 2018-04-10 NOTE — Assessment & Plan Note (Signed)
Improved but still low, continue as is with replacement and recheck in a few months.  She agrees.

## 2018-04-10 NOTE — Assessment & Plan Note (Signed)
We talked about allergy eval given her med allergy hx. ordered.

## 2018-04-10 NOTE — Assessment & Plan Note (Signed)
Tetanus 2011 Flu 2019 Shingles d/w pt. Out of stock.  PNA prev done 2014 Pap per gyn.I'll defer.   DXA not due Colonoscopy 2019 Mammogram 2019 Living will d/w pt. Husband designated if patient were incapacitated.  Diet and exercise d/w pt.

## 2018-04-11 NOTE — Telephone Encounter (Signed)
Electronic refill request Metoprolol Last office visit 03/31/18 Refill request is for 3 tablets a day Script for Metoprolol was sent to the pharmacy for one daily on 03/31/2018 #90/3 refills. Instructions on the medication list does not match the refill request Please verify how patient is to be taking the medication

## 2018-04-12 NOTE — Telephone Encounter (Signed)
She is taking 1 a day, based on last OV discussion.  rx sent.  Thanks.

## 2018-04-21 ENCOUNTER — Encounter: Payer: Self-pay | Admitting: Family Medicine

## 2018-05-05 ENCOUNTER — Encounter: Payer: Self-pay | Admitting: Family Medicine

## 2018-05-06 ENCOUNTER — Encounter: Payer: Self-pay | Admitting: Family Medicine

## 2018-05-11 ENCOUNTER — Other Ambulatory Visit: Payer: Self-pay | Admitting: Family Medicine

## 2018-06-29 ENCOUNTER — Other Ambulatory Visit: Payer: Managed Care, Other (non HMO)

## 2018-10-21 ENCOUNTER — Ambulatory Visit: Payer: Managed Care, Other (non HMO)

## 2018-10-27 ENCOUNTER — Ambulatory Visit (INDEPENDENT_AMBULATORY_CARE_PROVIDER_SITE_OTHER): Payer: Managed Care, Other (non HMO)

## 2018-10-27 DIAGNOSIS — Z23 Encounter for immunization: Secondary | ICD-10-CM | POA: Diagnosis not present

## 2019-04-16 ENCOUNTER — Other Ambulatory Visit: Payer: Self-pay | Admitting: Family Medicine

## 2019-04-16 DIAGNOSIS — E559 Vitamin D deficiency, unspecified: Secondary | ICD-10-CM

## 2019-04-16 DIAGNOSIS — E119 Type 2 diabetes mellitus without complications: Secondary | ICD-10-CM

## 2019-04-19 ENCOUNTER — Encounter: Payer: Self-pay | Admitting: Family Medicine

## 2019-04-26 ENCOUNTER — Other Ambulatory Visit: Payer: Self-pay

## 2019-04-26 MED ORDER — SPIRONOLACTONE 25 MG PO TABS: 25.0000 mg | ORAL_TABLET | Freq: Every day | ORAL | 0 refills | Status: DC

## 2019-04-27 ENCOUNTER — Other Ambulatory Visit (INDEPENDENT_AMBULATORY_CARE_PROVIDER_SITE_OTHER): Payer: Managed Care, Other (non HMO)

## 2019-04-27 ENCOUNTER — Other Ambulatory Visit: Payer: Self-pay

## 2019-04-27 DIAGNOSIS — E119 Type 2 diabetes mellitus without complications: Secondary | ICD-10-CM

## 2019-04-27 DIAGNOSIS — E559 Vitamin D deficiency, unspecified: Secondary | ICD-10-CM

## 2019-04-27 LAB — LIPID PANEL
Cholesterol: 237 mg/dL — ABNORMAL HIGH (ref 0–200)
HDL: 40.4 mg/dL (ref >39.00)
NonHDL: 196.27
Total CHOL/HDL Ratio: 6
Triglycerides: 395 mg/dL — ABNORMAL HIGH (ref 0.0–149.0)
VLDL: 79 mg/dL — ABNORMAL HIGH (ref 0.0–40.0)

## 2019-04-27 LAB — COMPREHENSIVE METABOLIC PANEL
ALT: 37 U/L — ABNORMAL HIGH (ref 0–35)
AST: 31 U/L (ref 0–37)
Albumin: 3.9 g/dL (ref 3.5–5.2)
Alkaline Phosphatase: 62 U/L (ref 39–117)
BUN: 16 mg/dL (ref 6–23)
CO2: 28 mEq/L (ref 19–32)
Calcium: 9.9 mg/dL (ref 8.4–10.5)
Chloride: 100 mEq/L (ref 96–112)
Creatinine, Ser: 0.94 mg/dL (ref 0.40–1.20)
GFR: 60.15 mL/min (ref >60.00)
Glucose, Bld: 214 mg/dL — ABNORMAL HIGH (ref 70–99)
Potassium: 4.1 mEq/L (ref 3.5–5.1)
Sodium: 135 mEq/L (ref 135–145)
Total Bilirubin: 0.6 mg/dL (ref 0.2–1.2)
Total Protein: 7.4 g/dL (ref 6.0–8.3)

## 2019-04-27 LAB — VITAMIN D 25 HYDROXY (VIT D DEFICIENCY, FRACTURES): VITD: 36.68 ng/mL (ref 30.00–100.00)

## 2019-04-27 LAB — HEMOGLOBIN A1C: Hgb A1c MFr Bld: 9.2 % — ABNORMAL HIGH (ref 4.6–6.5)

## 2019-04-27 LAB — TSH: TSH: 1.22 u[IU]/mL (ref 0.35–4.50)

## 2019-04-27 LAB — LDL CHOLESTEROL, DIRECT: Direct LDL: 107 mg/dL

## 2019-04-30 ENCOUNTER — Encounter: Payer: Self-pay | Admitting: Family Medicine

## 2019-05-02 ENCOUNTER — Encounter: Payer: Managed Care, Other (non HMO) | Admitting: Family Medicine

## 2019-05-09 ENCOUNTER — Encounter: Payer: Self-pay | Admitting: Family Medicine

## 2019-05-09 ENCOUNTER — Other Ambulatory Visit: Payer: Self-pay

## 2019-05-09 ENCOUNTER — Ambulatory Visit (INDEPENDENT_AMBULATORY_CARE_PROVIDER_SITE_OTHER): Payer: Managed Care, Other (non HMO) | Admitting: Family Medicine

## 2019-05-09 VITALS — BP 160/90 | HR 73 | Temp 97.1°F | Ht 65.0 in | Wt 227.4 lb

## 2019-05-09 DIAGNOSIS — Z7189 Other specified counseling: Secondary | ICD-10-CM

## 2019-05-09 DIAGNOSIS — E785 Hyperlipidemia, unspecified: Secondary | ICD-10-CM

## 2019-05-09 DIAGNOSIS — Z0000 Encounter for general adult medical examination without abnormal findings: Secondary | ICD-10-CM

## 2019-05-09 DIAGNOSIS — E119 Type 2 diabetes mellitus without complications: Secondary | ICD-10-CM

## 2019-05-09 DIAGNOSIS — E042 Nontoxic multinodular goiter: Secondary | ICD-10-CM

## 2019-05-09 DIAGNOSIS — I10 Essential (primary) hypertension: Secondary | ICD-10-CM

## 2019-05-09 DIAGNOSIS — Z889 Allergy status to unspecified drugs, medicaments and biological substances status: Secondary | ICD-10-CM

## 2019-05-09 MED ORDER — SPIRONOLACTONE 25 MG PO TABS: 25.0000 mg | ORAL_TABLET | Freq: Every day | ORAL | 3 refills | Status: DC

## 2019-05-09 MED ORDER — METOPROLOL SUCCINATE ER 50 MG PO TB24: 50.0000 mg | ORAL_TABLET | Freq: Every day | ORAL | 3 refills | Status: DC

## 2019-05-09 NOTE — Patient Instructions (Addendum)
If you need an order for a meter, then let us know the specific type and we'll work on it.  Keep working on diet and recheck labs prior to a visit in about 3 months.  Take care.  Glad to see you.

## 2019-05-09 NOTE — Progress Notes (Signed)
This visit occurred during the SARS-CoV-2 public health emergency.  Safety protocols were in place, including screening questions prior to the visit, additional usage of staff PPE, and extensive cleaning of exam room while observing appropriate contact time as indicated for disinfecting solutions.  CPE- See plan.  Routine anticipatory guidance given to patient.  See health maintenance.  The possibility exists that previously documented standard health maintenance information may have been brought forward from a previous encounter into this note.  If needed, that same information has been updated to reflect the current situation based on today's encounter.    Tetanus 2011 Flu 2020 Shingles deferred by patient.  PNA prev done 2014 covid vaccine d/w pt.  She can get that done when possible.  D/w pt.   Pap per gyn.I'll defer.  DXA not due Colonoscopy 2019 Mammogram to be done after getting covid vaccine.   Living will d/w pt. Husband designated if patient were incapacitated.  Diet and exercise d/w pt.  Diabetes:  No meds.  Hypoglycemic episodes: no  Hyperglycemic episodes: no Feet problems: see below.  No new symptoms from DM.   Blood Sugars averaging: not checked often eye exam within last year: to be done after covid vaccine.  A1c d/w pt.   She had trouble exercise from prev leg injury.  She has foot surgery possibly coming up and that limits exertion.   She cut out carbs after she saw her labs.    Lipids elevated, d/w pt about labs and diet and exercise.    Hypertension:    Using medication without problems or lightheadedness: yes Chest pain with exertion:no Edema:no Short of breath:no Labs d/w pt.   Still on metoprolol and spironolactone.   Blood pressure controlled on home check.  130/71 at home.  She has a history of whitecoat hypertension.  Her home blood pressure cuff is calibrated.  She doesn't have choking episodes but some occ upper esophageal dysphagia that isn't  consistent.  Prev has seen endo re: thyroid with prev U/S done.    Discussed with patient about previous allergy clinic evaluation.  Information pasted below. ========================= INTERPRETATION: This patient does not have an IgE-mediated hypersensitivity (immediate reaction) to penicillin antibiotics and is thus low-risk for a severe systemic reaction from penicillin antimicrobials.  RECOMMENDATIONS:  - She may receive penicillin antimicrobials - She may receive other beta-lactam antibiotics (cephalosporins, carbapenems, monobactams) if she has not had a specific reaction to one. - Importantly, this does not preclude the patient from having other types of hypersensitivity reactions (e.g., delayed) or intolerances/adverse drug effects from penicillins or other beta-lactam antibiotics. - We have removed the penicillin allergy from this patient's Epic chart and counseled the patient to let her other medical providers know that this allergy has been cleared.   I was present for the entirety of the procedure.   Richardean Sale, MD, PhD =========================  PMH and [facility] reviewed  Meds, vitals, and allergies reviewed.   ROS: Per HPI.  Unless specifically indicated otherwise in HPI, the patient denies:  General: fever. Eyes: acute vision changes ENT: sore throat Cardiovascular: chest pain Respiratory: SOB GI: vomiting GU: dysuria Musculoskeletal: acute back pain Derm: acute rash Neuro: acute motor dysfunction Psych: worsening mood Endocrine: polydipsia Heme: bleeding Allergy: hayfever  GEN: nad, alert and oriented HEENT: ncat NECK: supple w/o LA, no stridor. CV: rrr. PULM: ctab, no inc wob ABD: soft, +bs EXT: no edema SKIN: no acute rash  Diabetic foot exam: Normal inspection No skin breakdown Callous on medial  L 5th toe Normal DP pulses Normal sensation to light touch and monofilament Nails normal

## 2019-05-10 NOTE — Assessment & Plan Note (Signed)
Lipids elevated, d/w pt about labs and diet and exercise.   Recheck periodically.  She agrees.

## 2019-05-10 NOTE — Assessment & Plan Note (Signed)
TSH normal.  If she has consistent symptoms and I want her to let me know.  No stridor.  Okay for outpatient follow-up.

## 2019-05-10 NOTE — Assessment & Plan Note (Signed)
She will to work on diet and exercise and recheck periodically and I think this is reasonable.  She will update me as needed.  See after visit summary.

## 2019-05-10 NOTE — Assessment & Plan Note (Signed)
Still on metoprolol and spironolactone.   Blood pressure controlled on home check.  130/71 at home.  She has a history of whitecoat hypertension.  Her home blood pressure cuff is calibrated.  Continue as is.  She agrees.

## 2019-05-10 NOTE — Assessment & Plan Note (Signed)
See above.  Not penicillin allergic now.  Discussed.

## 2019-05-10 NOTE — Assessment & Plan Note (Signed)
Living will d/w pt.  Husband designated if patient were incapacitated.  

## 2019-05-10 NOTE — Assessment & Plan Note (Signed)
Tetanus 2011 Flu 2020 Shingles deferred by patient.  PNA prev done 2014 covid vaccine d/w pt.  She can get that done when possible.  D/w pt.   Pap per gyn.I'll defer.  DXA not due Colonoscopy 2019 Mammogram to be done after getting covid vaccine.   Living will d/w pt. Husband designated if patient were incapacitated.  Diet and exercise d/w pt.

## 2019-05-14 ENCOUNTER — Ambulatory Visit: Payer: Managed Care, Other (non HMO) | Attending: Internal Medicine

## 2019-05-14 DIAGNOSIS — Z23 Encounter for immunization: Secondary | ICD-10-CM

## 2019-05-14 NOTE — Progress Notes (Signed)
   Covid-19 Vaccination Clinic  Name:  [name]    MRN: [id number] DOB: 04/29/1955  05/14/2019  Alexandria Fry was observed post Covid-19 immunization for 30 minutes based on pre-vaccination screening without incident. She was provided with Vaccine Information Sheet and instruction to access the V-Safe system.   Alexandria Fry was instructed to call 911 with any severe reactions post vaccine: Marland Kitchen Difficulty breathing  . Swelling of face and throat  . A fast heartbeat  . A bad rash all over body  . Dizziness and weakness   Immunizations Administered    Name Date Dose VIS Date Route   Pfizer COVID-19 Vaccine 05/14/2019  1:53 PM 0.3 mL 01/27/2019 Intramuscular   Manufacturer: [employer]   Lot: [id number]   [place]: [id number]

## 2019-05-17 ENCOUNTER — Encounter: Payer: Self-pay | Admitting: Family Medicine

## 2019-05-27 ENCOUNTER — Encounter: Payer: Self-pay | Admitting: Family Medicine

## 2019-06-01 ENCOUNTER — Other Ambulatory Visit: Payer: Self-pay | Admitting: Family Medicine

## 2019-06-01 ENCOUNTER — Encounter: Payer: Self-pay | Admitting: Family Medicine

## 2019-06-01 MED ORDER — SPIRONOLACTONE 25 MG PO TABS: 37.5000 mg | ORAL_TABLET | Freq: Every day | ORAL | Status: DC

## 2019-06-06 ENCOUNTER — Ambulatory Visit: Payer: Managed Care, Other (non HMO) | Attending: Internal Medicine

## 2019-06-06 DIAGNOSIS — Z23 Encounter for immunization: Secondary | ICD-10-CM

## 2019-06-06 NOTE — Progress Notes (Signed)
   Covid-19 Vaccination Clinic  Name:  Alexandria Fry    MRN: [id number] DOB: 11-04-1955  06/06/2019  [name] was observed post Covid-19 immunization for 30 minutes without incident. She was provided with Vaccine Information Sheet and instruction to access the V-Safe system.   [name] was instructed to call 911 with any severe reactions post vaccine: Marland Kitchen Difficulty breathing  . Swelling of face and throat  . A fast heartbeat  . A bad rash all over body  . Dizziness and weakness   Immunizations Administered    Name Date Dose VIS Date Route   Pfizer COVID-19 Vaccine 06/06/2019  2:40 PM 0.3 mL 04/12/2018 Intramuscular   Manufacturer: [place]   Lot: [id number]   [place]: [id number]

## 2019-06-22 ENCOUNTER — Encounter: Payer: Self-pay | Admitting: Podiatry

## 2019-06-22 ENCOUNTER — Ambulatory Visit (INDEPENDENT_AMBULATORY_CARE_PROVIDER_SITE_OTHER): Payer: Managed Care, Other (non HMO) | Admitting: Podiatry

## 2019-06-22 ENCOUNTER — Other Ambulatory Visit: Payer: Self-pay

## 2019-06-22 DIAGNOSIS — M2042 Other hammer toe(s) (acquired), left foot: Secondary | ICD-10-CM

## 2019-06-22 DIAGNOSIS — M79675 Pain in left toe(s): Secondary | ICD-10-CM | POA: Diagnosis not present

## 2019-06-22 DIAGNOSIS — L608 Other nail disorders: Secondary | ICD-10-CM

## 2019-06-22 DIAGNOSIS — E119 Type 2 diabetes mellitus without complications: Secondary | ICD-10-CM

## 2019-06-22 DIAGNOSIS — B351 Tinea unguium: Secondary | ICD-10-CM

## 2019-06-22 DIAGNOSIS — Q828 Other specified congenital malformations of skin: Secondary | ICD-10-CM

## 2019-06-22 DIAGNOSIS — M79674 Pain in right toe(s): Secondary | ICD-10-CM

## 2019-06-22 DIAGNOSIS — L84 Corns and callosities: Secondary | ICD-10-CM | POA: Insufficient documentation

## 2019-06-22 NOTE — Progress Notes (Signed)
This patient presents to the office stating the corn on her fourth toe left foot is so painful she can hardly walk.  She says the corn has been present for months and especially with Covid she was unable to be seen.  No self treatment afforded.  She presents to the office for evaluation and treatment.  She has history of diabetes with no complications.    General Appearance  Alert, conversant and in no acute stress.  Vascular  Dorsalis pedis and posterior tibial  pulses are palpable  bilaterally.  Capillary return is within normal limits  bilaterally. Temperature is within normal limits  bilaterally.  Neurologic  Senn-Weinstein monofilament wire test within normal limits  bilaterally. Muscle power within normal limits bilaterally.  Nails Thick disfigured discolored nails with subungual debris  hallux  bilaterally. No evidence of bacterial infection or drainage bilaterally. Pincer nails 1,2  B/L.  Orthopedic  No limitations of motion  feet .  No crepitus or effusions noted.  No bony pathology or digital deformities noted.  HAV  B/L.  Hammer toes 4,5 left foot.  Skin  normotropic skin with no porokeratosis noted bilaterally.  No signs of infections or ulcers noted. Corn at lateral aspect fourth toe at level of PIPJ.  Onychomycosis  Hallux  B/L   Corn 4th toe left foot  ROV.  Debride corn with # 15 blade.  Debridement of Hallux nails with nail nipper followed by dremel tool.  RTC prn  Gardiner Barefoot DPM

## 2019-07-13 ENCOUNTER — Encounter: Payer: Self-pay | Admitting: Podiatry

## 2019-07-13 ENCOUNTER — Ambulatory Visit (INDEPENDENT_AMBULATORY_CARE_PROVIDER_SITE_OTHER): Payer: Managed Care, Other (non HMO) | Admitting: Podiatry

## 2019-07-13 ENCOUNTER — Other Ambulatory Visit: Payer: Self-pay

## 2019-07-13 DIAGNOSIS — L84 Corns and callosities: Secondary | ICD-10-CM | POA: Diagnosis not present

## 2019-07-13 DIAGNOSIS — E119 Type 2 diabetes mellitus without complications: Secondary | ICD-10-CM

## 2019-07-13 DIAGNOSIS — M2042 Other hammer toe(s) (acquired), left foot: Secondary | ICD-10-CM

## 2019-07-13 DIAGNOSIS — Q828 Other specified congenital malformations of skin: Secondary | ICD-10-CM | POA: Diagnosis not present

## 2019-07-13 NOTE — Progress Notes (Signed)
This patient returns to the office stating that the corn between her fourth and fifth toes on her left foot has redeveloped she says this corn is painful walking wearing her shoes.  She says she has been wearing a toe separator but the corn has still developed.  She believes she may have developed an infection due to the presence of the corn on her fifth toe.  She presents to the office for evaluation and treatment.  Patient is diabetic.    General Appearance  Alert, conversant and in no acute stress.  Vascular  Dorsalis pedis and posterior tibial  pulses are palpable  bilaterally.  Capillary return is within normal limits  bilaterally. Temperature is within normal limits  bilaterally.  Neurologic  Senn-Weinstein monofilament wire test within normal limits  bilaterally. Muscle power within normal limits bilaterally.  Nails Thick disfigured discolored nails with subungual debris  Hallux  B/L. bilaterally. No evidence of bacterial infection or drainage bilaterally. Pincer nails  1,2  B/L.  Orthopedic  No limitations of motion  feet .  No crepitus or effusions noted.  HAV  B/L.  Hammer toes 4/5 left foot.  Skin  normotropic skin with no porokeratosis noted bilaterally.  No signs of infections or ulcers noted. Corn 4/5  Left foot.  Corn 4/5  Left foot.  Hammer toes 4/5  Left foot.  Debride corn with # 15 blade.  Padding dispensed.  Told patient we can evaluate her for pes planus and toe surgery when she returns from her vacation.  RTC prn   Gardiner Barefoot DPM

## 2019-07-18 ENCOUNTER — Encounter: Payer: Self-pay | Admitting: Family Medicine

## 2019-07-19 MED ORDER — SPIRONOLACTONE 25 MG PO TABS: 25.0000 mg | ORAL_TABLET | Freq: Every day | ORAL | Status: DC

## 2019-07-28 LAB — HM DIABETES EYE EXAM

## 2019-08-10 ENCOUNTER — Other Ambulatory Visit: Payer: Managed Care, Other (non HMO)

## 2019-08-10 ENCOUNTER — Other Ambulatory Visit: Payer: Self-pay

## 2019-08-10 ENCOUNTER — Other Ambulatory Visit (INDEPENDENT_AMBULATORY_CARE_PROVIDER_SITE_OTHER): Payer: Managed Care, Other (non HMO)

## 2019-08-10 DIAGNOSIS — E119 Type 2 diabetes mellitus without complications: Secondary | ICD-10-CM

## 2019-08-10 LAB — LIPID PANEL
Cholesterol: 192 mg/dL (ref 0–200)
HDL: 33 mg/dL — ABNORMAL LOW (ref >39.00)
NonHDL: 158.57
Total CHOL/HDL Ratio: 6
Triglycerides: 225 mg/dL — ABNORMAL HIGH (ref 0.0–149.0)
VLDL: 45 mg/dL — ABNORMAL HIGH (ref 0.0–40.0)

## 2019-08-10 LAB — LDL CHOLESTEROL, DIRECT: Direct LDL: 100 mg/dL

## 2019-08-10 LAB — COMPREHENSIVE METABOLIC PANEL
ALT: 25 U/L (ref 0–35)
AST: 23 U/L (ref 0–37)
Albumin: 4.3 g/dL (ref 3.5–5.2)
Alkaline Phosphatase: 59 U/L (ref 39–117)
BUN: 14 mg/dL (ref 6–23)
CO2: 31 mEq/L (ref 19–32)
Calcium: 10.4 mg/dL (ref 8.4–10.5)
Chloride: 101 mEq/L (ref 96–112)
Creatinine, Ser: 0.85 mg/dL (ref 0.40–1.20)
GFR: 67.49 mL/min (ref >60.00)
Glucose, Bld: 84 mg/dL (ref 70–99)
Potassium: 3.9 mEq/L (ref 3.5–5.1)
Sodium: 137 mEq/L (ref 135–145)
Total Bilirubin: 0.6 mg/dL (ref 0.2–1.2)
Total Protein: 7.2 g/dL (ref 6.0–8.3)

## 2019-08-10 LAB — HEMOGLOBIN A1C: Hgb A1c MFr Bld: 6.6 % — ABNORMAL HIGH (ref 4.6–6.5)

## 2019-08-14 ENCOUNTER — Encounter: Payer: Self-pay | Admitting: Family Medicine

## 2019-08-14 ENCOUNTER — Ambulatory Visit (INDEPENDENT_AMBULATORY_CARE_PROVIDER_SITE_OTHER): Payer: Managed Care, Other (non HMO) | Admitting: Family Medicine

## 2019-08-14 ENCOUNTER — Other Ambulatory Visit: Payer: Self-pay

## 2019-08-14 VITALS — BP 142/78 | HR 83 | Temp 97.3°F | Ht 65.0 in | Wt 208.4 lb

## 2019-08-14 DIAGNOSIS — R001 Bradycardia, unspecified: Secondary | ICD-10-CM

## 2019-08-14 DIAGNOSIS — E119 Type 2 diabetes mellitus without complications: Secondary | ICD-10-CM

## 2019-08-14 DIAGNOSIS — I10 Essential (primary) hypertension: Secondary | ICD-10-CM | POA: Diagnosis not present

## 2019-08-14 NOTE — Progress Notes (Signed)
This visit occurred during the SARS-CoV-2 public health emergency.  Safety protocols were in place, including screening questions prior to the visit, additional usage of staff PPE, and extensive cleaning of exam room while observing appropriate contact time as indicated for disinfecting solutions.  Hypertension:    Using medication without problems or lightheadedness: yes Chest pain with exertion:no Edema:no Short of breath: she had SOB after prev pneumonia.  She was able to walk [place] [employer] [place] and did well with that.   Average home BPs: controlled at home.   She has occ lower pulse, down to 40s or 50s on home check.   She likely had false elevation of her BP here at clinic, initially up to 106s.  She is still on spironolactone 25mg  a day.    Diabetes:  No meds.   Hypoglycemic episodes:no Hyperglycemic episodes:no Feet problems:no Blood Sugars averaging: usually ~ 130s.   eye exam within last year: yes A1c much lower and intentional weight loss noted.   All of her labs look better.   A1c improved.    Her brother may need a kidney transplant, d/w pt.    Meds, vitals, and allergies reviewed.  PMH and SH reviewed  ROS: Per HPI unless specifically indicated in ROS section   GEN: nad, alert and oriented HEENT: ncat NECK: supple w/o LA CV: rrr. PULM: ctab, no inc wob ABD: soft, +bs EXT: no edema SKIN: no acute rash

## 2019-08-14 NOTE — Patient Instructions (Addendum)
Thanks for your effort.   Take care.  Glad to see you. Don't change your meds for now.  Recheck A1c at a visit in about 3-4 months.   The only lab you need to have done for your next diabetic visit is an A1c.  We can do this with a fingerstick test at the office visit.  You do not need a lab visit ahead of time for this.  It does not matter if you are fasting when the lab is done.    We'll call about seeing cardiology.

## 2019-08-16 NOTE — Assessment & Plan Note (Signed)
A1c much lower and intentional weight loss noted.   All of her labs look better.   A1c improved.   We can recheck periodically.  No change in meds.

## 2019-08-16 NOTE — Assessment & Plan Note (Signed)
BP controlled at home.   She has occ lower pulse, down to 40s or 50s on home check.   She likely had false elevation of her BP here at clinic, initially up to 106s.  She is still on spironolactone 25mg  a day.   No change in medications at this point but I would like her to see cardiology as she is not on any nodal blocking agents.  Referral placed.  She agrees.

## 2019-08-25 ENCOUNTER — Encounter: Payer: Self-pay | Admitting: Cardiology

## 2019-08-25 ENCOUNTER — Other Ambulatory Visit: Payer: Self-pay

## 2019-08-25 ENCOUNTER — Ambulatory Visit (INDEPENDENT_AMBULATORY_CARE_PROVIDER_SITE_OTHER): Payer: Managed Care, Other (non HMO) | Admitting: Cardiology

## 2019-08-25 ENCOUNTER — Ambulatory Visit (INDEPENDENT_AMBULATORY_CARE_PROVIDER_SITE_OTHER): Payer: Managed Care, Other (non HMO)

## 2019-08-25 VITALS — BP 164/72 | Ht 65.0 in | Wt 206.1 lb

## 2019-08-25 DIAGNOSIS — R001 Bradycardia, unspecified: Secondary | ICD-10-CM | POA: Diagnosis not present

## 2019-08-25 DIAGNOSIS — I10 Essential (primary) hypertension: Secondary | ICD-10-CM

## 2019-08-25 DIAGNOSIS — R011 Cardiac murmur, unspecified: Secondary | ICD-10-CM | POA: Diagnosis not present

## 2019-08-25 NOTE — Progress Notes (Signed)
Cardiology Office Note:    Date:  08/25/2019   ID:  Alexandria Fry, DOB Aug 04, 1955, MRN [id number]  PCP:  Tonia Ghent, MD  [place] Cardiologist:  No primary care provider on file.  [facility] Electrophysiologist:  None   Referring MD: Tonia Ghent, MD   Chief Complaint  Patient presents with  . New Patient (Initial Visit)    Ref by Dr. Damita Dunnings for bradycardia. Meds reviewed by the pt. verbally. Pt. c/o ringing in left ear, fluttering in chest, indigestion with feeling like a needle in chest at times.    Alexandria Fry is a 64 y.o. female who is being seen today for the evaluation of bradycardia at the request of Tonia Ghent, MD.   History of Present Illness:    Alexandria Fry is a 64 y.o. female with a hx of hypertension, systolic murmur who presents due to slow heart rates.  Patient states having low heart rates for years now.  Was originally on metoprolol but that was stopped about 3 months ago.  Her heart rates have been as low as 40s when she checked at home.  She denies any dizziness, chest pain, shortness of breath at rest.  Denies any history of heart disease.  Blood pressure is usually normal at home in the 629 systolic.  Episodes of low heart rates are not associated with exertion.  She has been told she has a heart murmur for years now.  Past Medical History:  Diagnosis Date  . Allergic rhinitis   . Diabetes mellitus without complication ([place])   . Difficult intubation   . Fatty liver   . GERD (gastroesophageal reflux disease)   . Hypertension   . Irregular menses    with neg endometrial biopsy 2013  . Menorrhagia   . Murmur, cardiac   . Pneumonia    2014  . PONV (postoperative nausea and vomiting)     Past Surgical History:  Procedure Laterality Date  . ABDOMINAL HYSTERECTOMY    . CERVICAL CONE BIOPSY    . COLONOSCOPY WITH PROPOFOL N/A 01/18/2018   Procedure: COLONOSCOPY WITH PROPOFOL;  Surgeon: Lollie Sails, MD;  Location:  [facility] ENDOSCOPY;  Service: Endoscopy;  Laterality: N/A;  . CYSTOSCOPY  11/11/2015   Procedure: CYSTOSCOPY;  Surgeon: Honor Loh Ward, MD;  Location: [facility] ORS;  Service: Gynecology;;  . DILATATION & CURETTAGE/HYSTEROSCOPY WITH MYOSURE N/A 03/21/2015   Procedure: DILATATION & CURETTAGE/HYSTEROSCOPY, POLYPECTOMY;  Surgeon: Honor Loh Ward, MD;  Location: [facility] ORS;  Service: Gynecology;  Laterality: N/A;  . DILATION AND CURETTAGE OF UTERUS  1981   [facility]   . DILATION AND CURETTAGE OF UTERUS  06/25/2009   normal (Dr. Laurey Morale)  . LAPAROSCOPIC BILATERAL SALPINGECTOMY Bilateral 11/11/2015   Procedure: LAPAROSCOPIC BILATERAL SALPINGECTOMY;  Surgeon: Honor Loh Ward, MD;  Location: [facility] ORS;  Service: Gynecology;  Laterality: Bilateral;  . LAPAROSCOPIC SUPRACERVICAL HYSTERECTOMY  11/11/2015   Procedure: LAPAROSCOPIC SUPRACERVICAL HYSTERECTOMY CONVERTED TO OPEN;  Surgeon: Honor Loh Ward, MD;  Location: [facility] ORS;  Service: Gynecology;;  . LAPAROTOMY  11/11/2015   Procedure: LAPAROTOMY;  Surgeon: Honor Loh Ward, MD;  Location: [facility] ORS;  Service: Gynecology;;  . VAGINAL DELIVERY     NSVD x 2    Current Medications: Current Meds  Medication Sig  . ALREX 0.2 % SUSP Apply 1 drop to eye 2 (two) times daily.   . Cholecalciferol 125 MCG (5000 UT) TABS Take 5,000 Units by mouth daily.  . hydrocortisone 2.5 %  cream Apply 1 application topically 2 (two) times daily.   . Multiple Vitamin (MULTIVITAMIN) tablet Take 1 tablet by mouth daily.  Marland Kitchen spironolactone (ALDACTONE) 25 MG tablet Take 1 tablet (25 mg total) by mouth daily.     Allergies:   Amlodipine, Azithromycin, Clavulanic acid, Hydrochlorothiazide, Metformin, Other, Sulfa antibiotics, Ace inhibitors, Acetaminophen, Angiotensin receptor blockers, Clonidine, Clonidine derivatives, Diphenhydramine hcl, Fentanyl, Hydrochlorothiazide w-triamterene, Hydrocodone, Metformin and related, Metoprolol, Nitrofurantoin, Oxycodone, Tetanus toxoids, Cefuroxime axetil, and  Levofloxacin in d5w   Social History   Socioeconomic History  . Marital status: Married    Spouse name: Not on file  . Number of children: 2  . Years of education: Not on file  . Highest education level: Not on file  Occupational History    Employer: [employer]    Comment: Buyer  Tobacco Use  . Smoking status: Former Smoker    Packs/day: 1.00    Years: 10.00    Pack years: 10.00    Types: Cigarettes    Quit date: 01/20/1982    Years since quitting: 37.6  . Smokeless tobacco: Never Used  . Tobacco comment: quit for 28 years  Vaping Use  . Vaping Use: Never used  Substance and Sexual Activity  . Alcohol use: Not Currently  . Drug use: No  . Sexual activity: Yes  Other Topics Concern  . Not on file  Social History Narrative   Lives with husband, 63   1 son at home   1 son out of the house   Social Determinants of Health   Financial Resource Strain:   . Difficulty of Paying Living Expenses:   Food Insecurity:   . Worried About Charity fundraiser in the Last Year:   . Arboriculturist in the Last Year:   Transportation Needs:   . Film/video editor (Medical):   Marland Kitchen Lack of Transportation (Non-Medical):   Physical Activity:   . Days of Exercise per Week:   . Minutes of Exercise per Session:   Stress:   . Feeling of Stress :   Social Connections:   . Frequency of Communication with Friends and Family:   . Frequency of Social Gatherings with Friends and Family:   . Attends Religious Services:   . Active Member of Clubs or Organizations:   . Attends Archivist Meetings:   Marland Kitchen Marital Status:      Family History: The patient's family history includes Alcohol abuse in her father; Breast cancer in her mother; Cancer in her father; Cancer (age of onset: 46) in her mother; Colon cancer in her mother; Colon polyps in her father; Diabetes in her mother; Early death (age of onset: 25) in her sister; Heart disease in her father; Hypertension in her mother;  Osteoporosis in her mother. There is no history of Thyroid disease.  ROS:   Please see the history of present illness.     All other systems reviewed and are negative.  EKGs/Labs/Other Studies Reviewed:    The following studies were reviewed today:   EKG:  EKG is  ordered today.  The ekg ordered today demonstrates sinus rhythm, heart rate 78 bpm.  Recent Labs: 04/27/2019: TSH 1.22 08/10/2019: ALT 25; BUN 14; Creatinine, Ser 0.85; Potassium 3.9; Sodium 137  Recent Lipid Panel    Component Value Date/Time   CHOL 192 08/10/2019 1244   TRIG 225.0 (H) 08/10/2019 1244   HDL 33.00 (L) 08/10/2019 1244   CHOLHDL 6 08/10/2019 1244   VLDL  45.0 (H) 08/10/2019 1244   LDLDIRECT 100.0 08/10/2019 1244    Physical Exam:    VS:  BP (!) 164/72 (BP Location: Right Arm, Patient Position: Sitting, Cuff Size: Normal)   Ht 5\' 5"  (1.651 m)   Wt 206 lb 2 oz (93.5 kg)   LMP  (LMP Unknown)   SpO2 98%   BMI 34.30 kg/m     Wt Readings from Last 3 Encounters:  08/25/19 206 lb 2 oz (93.5 kg)  08/14/19 208 lb 6 oz (94.5 kg)  05/09/19 227 lb 7 oz (103.2 kg)     GEN:  Well nourished, well developed in no acute distress HEENT: Normal NECK: No JVD; No carotid bruits LYMPHATICS: No lymphadenopathy CARDIAC: RRR, 2/6 systolic murmur RESPIRATORY:  Clear to auscultation without rales, wheezing or rhonchi  ABDOMEN: Soft, non-tender, non-distended MUSCULOSKELETAL:  No edema; No deformity  SKIN: Warm and dry NEUROLOGIC:  Alert and oriented x 3 PSYCHIATRIC:  Normal affect   ASSESSMENT:    1. Bradycardia   2. Systolic murmur   3. Essential hypertension    PLAN:    In order of problems listed above:  1. Patient with history of low heart rates at home, heart rate in the 40s.  Asymptomatic.  Will get cardiac monitor x1 week to evaluate for any significant heart block. 2. Systolic murmur noted on exam.  Get echocardiogram to evaluate any significant valvular pathology. 3. History of hypertension, blood  pressure is usually normal at home.  Elevated today.  Patient likely has whitecoat syndrome.  Continue current BP meds as prescribed.  Follow-up after echocardiogram and cardiac monitor.   Medication Adjustments/Labs and Tests Ordered: Current medicines are reviewed at length with the patient today.  Concerns regarding medicines are outlined above.  Orders Placed This Encounter  Procedures  . LONG TERM MONITOR (3-14 DAYS)  . EKG 12-Lead  . ECHOCARDIOGRAM COMPLETE   No orders of the defined types were placed in this encounter.   Patient Instructions  Medication Instructions:   Your physician recommends that you continue on your current medications as directed. Please refer to the Current Medication list given to you today.  *If you need a refill on your cardiac medications before your next appointment, please call your pharmacy*   Lab Work: None Ordered If you have labs (blood work) drawn today and your tests are completely normal, you will receive your results only by: Marland Kitchen MyChart Message (if you have MyChart) OR . A paper copy in the mail If you have any lab test that is abnormal or we need to change your treatment, we will call you to review the results.   Testing/Procedures:  1.  Your physician has requested that you have an echocardiogram. Echocardiography is a painless test that uses sound waves to create images of your heart. It provides your doctor with information about the size and shape of your heart and how well your heart's chambers and valves are working. This procedure takes approximately one hour. There are no restrictions for this procedure.   2.  Your physician has recommended that you wear a Zio monitor. This monitor is a medical device that records the heart's electrical activity. Doctors most often use these monitors to diagnose arrhythmias. Arrhythmias are problems with the speed or rhythm of the heartbeat. The monitor is a small device applied to your chest.  You can wear one while you do your normal daily activities. While wearing this monitor if you have any symptoms to push the  button and record what you felt. Once you have worn this monitor for the period of time provider prescribed (Usually 14 days), you will return the monitor device in the postage paid box. Once it is returned they will download the data collected and provide [place] with a report which the provider will then review and we will call you with those results. Important tips:  1. Avoid showering during the first 24 hours of wearing the monitor. 2. Avoid excessive sweating to help maximize wear time. 3. Do not submerge the device, no hot tubs, and no swimming pools. 4. Keep any lotions or oils away from the patch. 5. After 24 hours you may shower with the patch on. Take brief showers with your back facing the shower head.  6. Do not remove patch once it has been placed because that will interrupt data and decrease adhesive wear time. 7. Push the button when you have any symptoms and write down what you were feeling. 8. Once you have completed wearing your monitor, remove and place into box which has postage paid and place in your outgoing mailbox.  9. If for some reason you have misplaced your box then call our office and we can provide another box and/or mail it off for you.       Follow-Up: At [facility], you and your health needs are our priority.  As part of our continuing mission to provide you with exceptional heart care, we have created designated Provider Care Teams.  These Care Teams include your primary Cardiologist (physician) and Advanced Practice Providers (APPs -  Physician Assistants and Nurse Practitioners) who all work together to provide you with the care you need, when you need it.  We recommend signing up for the patient portal called "MyChart".  Sign up information is provided on this After Visit Summary.  MyChart is used to connect with patients for Virtual  Visits (Telemedicine).  Patients are able to view lab/test results, encounter notes, upcoming appointments, etc.  Non-urgent messages can be sent to your provider as well.   To learn more about what you can do with MyChart, go to [email or web address].    Your next appointment:   Follow up after Zio monitor and Echo   The format for your next appointment:   In Person  Provider:   Kate Sable, MD   Other Instructions   Echocardiogram An echocardiogram is a procedure that uses painless sound waves (ultrasound) to produce an image of the heart. Images from an echocardiogram can provide important information about:  Signs of coronary artery disease (CAD).  Aneurysm detection. An aneurysm is a weak or damaged part of an artery wall that bulges out from the normal force of blood pumping through the body.  Heart size and shape. Changes in the size or shape of the heart can be associated with certain conditions, including heart failure, aneurysm, and CAD.  Heart muscle function.  Heart valve function.  Signs of a past heart attack.  Fluid buildup around the heart.  Thickening of the heart muscle.  A tumor or infectious growth around the heart valves. Tell a health care provider about:  Any allergies you have.  All medicines you are taking, including vitamins, herbs, eye drops, creams, and over-the-counter medicines.  Any blood disorders you have.  Any surgeries you have had.  Any medical conditions you have.  Whether you are pregnant or may be pregnant. What are the risks? Generally, this is a safe procedure. However, problems may  occur, including:  Allergic reaction to dye (contrast) that may be used during the procedure. What happens before the procedure? No specific preparation is needed. You may eat and drink normally. What happens during the procedure?   An IV tube may be inserted into one of your veins.  You may receive contrast through this tube. A  contrast is an injection that improves the quality of the pictures from your heart.  A gel will be applied to your chest.  A wand-like tool (transducer) will be moved over your chest. The gel will help to transmit the sound waves from the transducer.  The sound waves will harmlessly bounce off of your heart to allow the heart images to be captured in real-time motion. The images will be recorded on a computer. The procedure may vary among health care providers and hospitals. What happens after the procedure?  You may return to your normal, everyday life, including diet, activities, and medicines, unless your health care provider tells you not to do that. Summary  An echocardiogram is a procedure that uses painless sound waves (ultrasound) to produce an image of the heart.  Images from an echocardiogram can provide important information about the size and shape of your heart, heart muscle function, heart valve function, and fluid buildup around your heart.  You do not need to do anything to prepare before this procedure. You may eat and drink normally.  After the echocardiogram is completed, you may return to your normal, everyday life, unless your health care provider tells you not to do that. This information is not intended to replace advice given to you by your health care provider. Make sure you discuss any questions you have with your health care provider. Document Revised: 05/26/2018 Document Reviewed: 03/07/2016 Elsevier Patient Education  2020 [place], Kate Sable, MD  08/25/2019 1:04 PM    [facility]

## 2019-08-25 NOTE — Patient Instructions (Signed)
Medication Instructions:   Your physician recommends that you continue on your current medications as directed. Please refer to the Current Medication list given to you today.  *If you need a refill on your cardiac medications before your next appointment, please call your pharmacy*   Lab Work: None Ordered If you have labs (blood work) drawn today and your tests are completely normal, you will receive your results only by: Marland Kitchen MyChart Message (if you have MyChart) OR . A paper copy in the mail If you have any lab test that is abnormal or we need to change your treatment, we will call you to review the results.   Testing/Procedures:  1.  Your physician has requested that you have an echocardiogram. Echocardiography is a painless test that uses sound waves to create images of your heart. It provides your doctor with information about the size and shape of your heart and how well your heart's chambers and valves are working. This procedure takes approximately one hour. There are no restrictions for this procedure.   2.  Your physician has recommended that you wear a Zio monitor. This monitor is a medical device that records the heart's electrical activity. Doctors most often use these monitors to diagnose arrhythmias. Arrhythmias are problems with the speed or rhythm of the heartbeat. The monitor is a small device applied to your chest. You can wear one while you do your normal daily activities. While wearing this monitor if you have any symptoms to push the button and record what you felt. Once you have worn this monitor for the period of time provider prescribed (Usually 14 days), you will return the monitor device in the postage paid box. Once it is returned they will download the data collected and provide [place] with a report which the provider will then review and we will call you with those results. Important tips:  1. Avoid showering during the first 24 hours of wearing the monitor. 2. Avoid  excessive sweating to help maximize wear time. 3. Do not submerge the device, no hot tubs, and no swimming pools. 4. Keep any lotions or oils away from the patch. 5. After 24 hours you may shower with the patch on. Take brief showers with your back facing the shower head.  6. Do not remove patch once it has been placed because that will interrupt data and decrease adhesive wear time. 7. Push the button when you have any symptoms and write down what you were feeling. 8. Once you have completed wearing your monitor, remove and place into box which has postage paid and place in your outgoing mailbox.  9. If for some reason you have misplaced your box then call our office and we can provide another box and/or mail it off for you.       Follow-Up: At [facility], you and your health needs are our priority.  As part of our continuing mission to provide you with exceptional heart care, we have created designated Provider Care Teams.  These Care Teams include your primary Cardiologist (physician) and Advanced Practice Providers (APPs -  Physician Assistants and Nurse Practitioners) who all work together to provide you with the care you need, when you need it.  We recommend signing up for the patient portal called "MyChart".  Sign up information is provided on this After Visit Summary.  MyChart is used to connect with patients for Virtual Visits (Telemedicine).  Patients are able to view lab/test results, encounter notes, upcoming appointments, etc.  Non-urgent messages can be sent to your provider as well.   To learn more about what you can do with MyChart, go to [email or web address].    Your next appointment:   Follow up after Zio monitor and Echo   The format for your next appointment:   In Person  Provider:   Kate Sable, MD   Other Instructions   Echocardiogram An echocardiogram is a procedure that uses painless sound waves (ultrasound) to produce an image of the heart.  Images from an echocardiogram can provide important information about:  Signs of coronary artery disease (CAD).  Aneurysm detection. An aneurysm is a weak or damaged part of an artery wall that bulges out from the normal force of blood pumping through the body.  Heart size and shape. Changes in the size or shape of the heart can be associated with certain conditions, including heart failure, aneurysm, and CAD.  Heart muscle function.  Heart valve function.  Signs of a past heart attack.  Fluid buildup around the heart.  Thickening of the heart muscle.  A tumor or infectious growth around the heart valves. Tell a health care provider about:  Any allergies you have.  All medicines you are taking, including vitamins, herbs, eye drops, creams, and over-the-counter medicines.  Any blood disorders you have.  Any surgeries you have had.  Any medical conditions you have.  Whether you are pregnant or may be pregnant. What are the risks? Generally, this is a safe procedure. However, problems may occur, including:  Allergic reaction to dye (contrast) that may be used during the procedure. What happens before the procedure? No specific preparation is needed. You may eat and drink normally. What happens during the procedure?   An IV tube may be inserted into one of your veins.  You may receive contrast through this tube. A contrast is an injection that improves the quality of the pictures from your heart.  A gel will be applied to your chest.  A wand-like tool (transducer) will be moved over your chest. The gel will help to transmit the sound waves from the transducer.  The sound waves will harmlessly bounce off of your heart to allow the heart images to be captured in real-time motion. The images will be recorded on a computer. The procedure may vary among health care providers and hospitals. What happens after the procedure?  You may return to your normal, everyday life,  including diet, activities, and medicines, unless your health care provider tells you not to do that. Summary  An echocardiogram is a procedure that uses painless sound waves (ultrasound) to produce an image of the heart.  Images from an echocardiogram can provide important information about the size and shape of your heart, heart muscle function, heart valve function, and fluid buildup around your heart.  You do not need to do anything to prepare before this procedure. You may eat and drink normally.  After the echocardiogram is completed, you may return to your normal, everyday life, unless your health care provider tells you not to do that. This information is not intended to replace advice given to you by your health care provider. Make sure you discuss any questions you have with your health care provider. Document Revised: 05/26/2018 Document Reviewed: 03/07/2016 Elsevier Patient Education  [place].

## 2019-08-28 ENCOUNTER — Other Ambulatory Visit: Payer: Self-pay | Admitting: Family Medicine

## 2019-08-31 ENCOUNTER — Encounter: Payer: Self-pay | Admitting: Family Medicine

## 2019-08-31 ENCOUNTER — Other Ambulatory Visit: Payer: Self-pay

## 2019-08-31 ENCOUNTER — Ambulatory Visit (INDEPENDENT_AMBULATORY_CARE_PROVIDER_SITE_OTHER): Payer: Managed Care, Other (non HMO) | Admitting: Family Medicine

## 2019-08-31 VITALS — BP 142/88 | HR 75 | Temp 96.2°F | Ht 65.0 in | Wt 206.6 lb

## 2019-08-31 DIAGNOSIS — I10 Essential (primary) hypertension: Secondary | ICD-10-CM

## 2019-08-31 DIAGNOSIS — Z1231 Encounter for screening mammogram for malignant neoplasm of breast: Secondary | ICD-10-CM

## 2019-08-31 NOTE — Progress Notes (Signed)
This visit occurred during the SARS-CoV-2 public health emergency.  Safety protocols were in place, including screening questions prior to the visit, additional usage of staff PPE, and extensive cleaning of exam room while observing appropriate contact time as indicated for disinfecting solutions.  BP 135/66 this AM on home check.    History of L breast pain.  She sleeps on her stomach and she thought that was contributing.  She also has h/o breast cysts.  She had pain initially in the L breast but that resolved.  She has had palpable cysts.  She is wearing a cardiac monitor.    No nipple discharge, dimpling, skin changes, or axillary LA.    Meds, vitals, and allergies reviewed.   ROS: Per HPI unless specifically indicated in ROS section   nad ncat rrr ctab Cardiac monitor in place.   Breast exam: No mass, nodules, thickening, tenderness, bulging, retraction, inflamation, nipple discharge or skin changes noted.  No axillary or clavicular LA.  Chaperoned exam.

## 2019-08-31 NOTE — Patient Instructions (Signed)
I put in the order for a routine screening mammogram.  If you have trouble getting scheduled then let me know.  Take care.  Glad to see you.

## 2019-09-01 ENCOUNTER — Encounter: Payer: Self-pay | Admitting: Family Medicine

## 2019-09-03 DIAGNOSIS — Z1231 Encounter for screening mammogram for malignant neoplasm of breast: Secondary | ICD-10-CM | POA: Insufficient documentation

## 2019-09-03 NOTE — Assessment & Plan Note (Signed)
At this point she is not having any pain.  It was likely that her pain was previously related to sleeping on her stomach.  She is asymptomatic.  I think it makes sense to go through with regular screening mammogram.  Discussed.  Patient agrees.  Order placed.

## 2019-09-03 NOTE — Assessment & Plan Note (Signed)
Blood pressure is reasonable on home check and I will await her cardiac monitor results.

## 2019-09-21 ENCOUNTER — Ambulatory Visit (INDEPENDENT_AMBULATORY_CARE_PROVIDER_SITE_OTHER): Payer: Managed Care, Other (non HMO)

## 2019-09-21 ENCOUNTER — Other Ambulatory Visit: Payer: Self-pay

## 2019-09-21 DIAGNOSIS — R001 Bradycardia, unspecified: Secondary | ICD-10-CM | POA: Diagnosis not present

## 2019-09-21 DIAGNOSIS — R011 Cardiac murmur, unspecified: Secondary | ICD-10-CM

## 2019-09-21 DIAGNOSIS — I10 Essential (primary) hypertension: Secondary | ICD-10-CM

## 2019-09-21 LAB — ECHOCARDIOGRAM COMPLETE
AR max vel: 2.51 cm2
AV Area VTI: 2.57 cm2
AV Area mean vel: 2.42 cm2
AV Mean grad: 5 mmHg
AV Peak grad: 8.6 mmHg
Ao pk vel: 1.47 m/s
Area-P 1/2: 3.4 cm2
S' Lateral: 2.4 cm

## 2019-09-21 MED ORDER — PERFLUTREN LIPID MICROSPHERE
1.0000 mL | INTRAVENOUS | Status: AC | PRN
  Administered 2019-09-21: 2 mL via INTRAVENOUS

## 2019-09-25 ENCOUNTER — Ambulatory Visit: Payer: Managed Care, Other (non HMO) | Admitting: Podiatry

## 2019-09-26 ENCOUNTER — Other Ambulatory Visit: Payer: Self-pay | Admitting: Otolaryngology

## 2019-09-26 DIAGNOSIS — H9311 Tinnitus, right ear: Secondary | ICD-10-CM

## 2019-09-29 ENCOUNTER — Encounter: Payer: Self-pay | Admitting: Family Medicine

## 2019-10-09 ENCOUNTER — Other Ambulatory Visit: Payer: Self-pay

## 2019-10-09 ENCOUNTER — Encounter: Payer: Self-pay | Admitting: Cardiology

## 2019-10-09 ENCOUNTER — Ambulatory Visit (INDEPENDENT_AMBULATORY_CARE_PROVIDER_SITE_OTHER): Payer: Managed Care, Other (non HMO) | Admitting: Cardiology

## 2019-10-09 VITALS — BP 134/80 | HR 74 | Ht 65.0 in | Wt 201.1 lb

## 2019-10-09 DIAGNOSIS — R001 Bradycardia, unspecified: Secondary | ICD-10-CM | POA: Diagnosis not present

## 2019-10-09 DIAGNOSIS — R011 Cardiac murmur, unspecified: Secondary | ICD-10-CM

## 2019-10-09 DIAGNOSIS — I10 Essential (primary) hypertension: Secondary | ICD-10-CM

## 2019-10-09 NOTE — Progress Notes (Signed)
Cardiology Office Note:    Date:  10/09/2019   ID:  Alexandria Fry, Alexandria Fry 06/16/1955, MRN [id number]  PCP:  Tonia Ghent, MD  [place] Cardiologist:  No primary care provider on file.  [facility] Electrophysiologist:  None   Referring MD: Tonia Ghent, MD   Chief Complaint  Patient presents with  . office visit    F/U after cardiac testing; Meds verbally reviewed with patient.     History of Present Illness:    Alexandria Fry is a 64 y.o. female with a hx of hypertension, systolic murmur who presents for follow-up.  She was last seen due to slow heart rates with pulse in the 40s.  Patient states having low heart rates for years now.  Monitor was placed to evaluate for any high degree AV block's.  Systolic murmur noted on cardiac exam, echocardiogram was ordered to evaluate any valvular pathology.  Blood pressure is usually normal at home but becomes elevated when she presents at the physician's office.  Blood pressure usually 322 systolic at home.  Patient states feeling well, exercises on her reclining bike for about 30 minutes daily without any evidence of chest pain, shortness of breath, palpitations.  Her heart rate is typically in the 50s barely gets her heart rates over 108 during exercising.  Otherwise she feels normal.   Past Medical History:  Diagnosis Date  . Allergic rhinitis   . Diabetes mellitus without complication ([place])   . Difficult intubation   . Fatty liver   . GERD (gastroesophageal reflux disease)   . Hypertension   . Irregular menses    with neg endometrial biopsy 2013  . Menorrhagia   . Murmur, cardiac   . Pneumonia    2014  . PONV (postoperative nausea and vomiting)     Past Surgical History:  Procedure Laterality Date  . ABDOMINAL HYSTERECTOMY    . CERVICAL CONE BIOPSY    . COLONOSCOPY WITH PROPOFOL N/A 01/18/2018   Procedure: COLONOSCOPY WITH PROPOFOL;  Surgeon: Lollie Sails, MD;  Location: [facility] ENDOSCOPY;  Service:  Endoscopy;  Laterality: N/A;  . CYSTOSCOPY  11/11/2015   Procedure: CYSTOSCOPY;  Surgeon: Honor Loh Ward, MD;  Location: [facility] ORS;  Service: Gynecology;;  . DILATATION & CURETTAGE/HYSTEROSCOPY WITH MYOSURE N/A 03/21/2015   Procedure: DILATATION & CURETTAGE/HYSTEROSCOPY, POLYPECTOMY;  Surgeon: Honor Loh Ward, MD;  Location: [facility] ORS;  Service: Gynecology;  Laterality: N/A;  . DILATION AND CURETTAGE OF UTERUS  1981   [facility]   . DILATION AND CURETTAGE OF UTERUS  06/25/2009   normal (Dr. Laurey Morale)  . LAPAROSCOPIC BILATERAL SALPINGECTOMY Bilateral 11/11/2015   Procedure: LAPAROSCOPIC BILATERAL SALPINGECTOMY;  Surgeon: Honor Loh Ward, MD;  Location: [facility] ORS;  Service: Gynecology;  Laterality: Bilateral;  . LAPAROSCOPIC SUPRACERVICAL HYSTERECTOMY  11/11/2015   Procedure: LAPAROSCOPIC SUPRACERVICAL HYSTERECTOMY CONVERTED TO OPEN;  Surgeon: Honor Loh Ward, MD;  Location: [facility] ORS;  Service: Gynecology;;  . LAPAROTOMY  11/11/2015   Procedure: LAPAROTOMY;  Surgeon: Honor Loh Ward, MD;  Location: [facility] ORS;  Service: Gynecology;;  . VAGINAL DELIVERY     NSVD x 2    Current Medications: Current Meds  Medication Sig  . ALREX 0.2 % SUSP Apply 1 drop to eye 2 (two) times daily.   . Cholecalciferol 125 MCG (5000 UT) TABS Take 5,000 Units by mouth daily.  . hydrocortisone 2.5 % cream Apply 1 application topically 2 (two) times daily.   . Multiple Vitamin (MULTIVITAMIN) tablet Take 1 tablet  by mouth daily.  Marland Kitchen spironolactone (ALDACTONE) 25 MG tablet Take 1 tablet (25 mg total) by mouth daily.     Allergies:   Amlodipine, Azithromycin, Clavulanic acid, Hydrochlorothiazide, Metformin, Other, Sulfa antibiotics, Ace inhibitors, Angiotensin receptor blockers, Clonidine derivatives, Diphenhydramine hcl, Fentanyl, Hydrochlorothiazide w-triamterene, Hydrocodone, Metformin and related, Metoprolol, Nitrofurantoin, Oxycodone, Tetanus toxoids, Cefuroxime axetil, and Levofloxacin in d5w   Social History   Socioeconomic  History  . Marital status: Married    Spouse name: Not on file  . Number of children: 2  . Years of education: Not on file  . Highest education level: Not on file  Occupational History    Employer: [employer]    Comment: Buyer  Tobacco Use  . Smoking status: Former Smoker    Packs/day: 1.00    Years: 10.00    Pack years: 10.00    Types: Cigarettes    Quit date: 01/20/1982    Years since quitting: 37.7  . Smokeless tobacco: Never Used  . Tobacco comment: quit for 28 years  Vaping Use  . Vaping Use: Never used  Substance and Sexual Activity  . Alcohol use: Not Currently  . Drug use: No  . Sexual activity: Yes  Other Topics Concern  . Not on file  Social History Narrative   Lives with husband, 35   1 son at home   1 son out of the house   Social Determinants of Health   Financial Resource Strain:   . Difficulty of Paying Living Expenses: Not on file  Food Insecurity:   . Worried About Charity fundraiser in the Last Year: Not on file  . Ran Out of Food in the Last Year: Not on file  Transportation Needs:   . Lack of Transportation (Medical): Not on file  . Lack of Transportation (Non-Medical): Not on file  Physical Activity:   . Days of Exercise per Week: Not on file  . Minutes of Exercise per Session: Not on file  Stress:   . Feeling of Stress : Not on file  Social Connections:   . Frequency of Communication with Friends and Family: Not on file  . Frequency of Social Gatherings with Friends and Family: Not on file  . Attends Religious Services: Not on file  . Active Member of Clubs or Organizations: Not on file  . Attends Archivist Meetings: Not on file  . Marital Status: Not on file     Family History: The patient's family history includes Alcohol abuse in her father; Breast cancer in her mother; Cancer in her father; Cancer (age of onset: 63) in her mother; Colon cancer in her mother; Colon polyps in her father; Diabetes in her mother; Early  death (age of onset: 56) in her sister; Heart disease in her father; Hypertension in her mother; Osteoporosis in her mother. There is no history of Thyroid disease.  ROS:   Please see the history of present illness.     All other systems reviewed and are negative.  EKGs/Labs/Other Studies Reviewed:    The following studies were reviewed today:   EKG:  EKG is  ordered today.  The ekg ordered today demonstrates sinus rhythm, heart rate 74 bpm.  Recent Labs: 04/27/2019: TSH 1.22 08/10/2019: ALT 25; BUN 14; Creatinine, Ser 0.85; Potassium 3.9; Sodium 137  Recent Lipid Panel    Component Value Date/Time   CHOL 192 08/10/2019 1244   TRIG 225.0 (H) 08/10/2019 1244   HDL 33.00 (L) 08/10/2019 1244  CHOLHDL 6 08/10/2019 1244   VLDL 45.0 (H) 08/10/2019 1244   LDLDIRECT 100.0 08/10/2019 1244    Physical Exam:    VS:  BP 134/80 (BP Location: Left Arm, Patient Position: Sitting, Cuff Size: Large)   Pulse 74   Ht 5\' 5"  (1.651 m)   Wt 201 lb 2 oz (91.2 kg)   LMP  (LMP Unknown)   SpO2 97%   BMI 33.47 kg/m     Wt Readings from Last 3 Encounters:  10/09/19 201 lb 2 oz (91.2 kg)  08/31/19 206 lb 9 oz (93.7 kg)  08/25/19 206 lb 2 oz (93.5 kg)     GEN:  Well nourished, well developed in no acute distress HEENT: Normal NECK: No JVD; No carotid bruits LYMPHATICS: No lymphadenopathy CARDIAC: RRR, 2/6 systolic murmur RESPIRATORY:  Clear to auscultation without rales, wheezing or rhonchi  ABDOMEN: Soft, non-tender, non-distended MUSCULOSKELETAL:  No edema; No deformity  SKIN: Warm and dry NEUROLOGIC:  Alert and oriented x 3 PSYCHIATRIC:  Normal affect   ASSESSMENT:    1. Bradycardia   2. Systolic murmur   3. Essential hypertension    PLAN:    In order of problems listed above:  1. Patient with history of low heart rates at home, heart rate in the 40s.  Asymptomatic.  Cardiac monitor showed an average heart rate of 59.  Minimal heart rate of 36 bpm was early in the morning.   Overall unremarkable cardiac monitor, no high degree AV block noted.  Patient reassured.  EKG today shows normal sinus rhythm, heart rate 74. 2. Systolic murmur noted on exam.  No significant valvular pathology noted on echocardiogram.  Normal systolic function, impaired relaxation.  Patient made aware of cardiac findings. 3. History of hypertension, blood pressure is controlled .  Continue current BP meds as prescribed.  Follow-up as needed  Total encounter time 35 minutes  Greater than 50% was spent in counseling and coordination of care with the patient   Medication Adjustments/Labs and Tests Ordered: Current medicines are reviewed at length with the patient today.  Concerns regarding medicines are outlined above.  Orders Placed This Encounter  Procedures  . EKG 12-Lead   No orders of the defined types were placed in this encounter.   Patient Instructions  Medication Instructions:  Your physician recommends that you continue on your current medications as directed. Please refer to the Current Medication list given to you today.  *If you need a refill on your cardiac medications before your next appointment, please call your pharmacy*   Lab Work: None Ordered If you have labs (blood work) drawn today and your tests are completely normal, you will receive your results only by: Marland Kitchen MyChart Message (if you have MyChart) OR . A paper copy in the mail If you have any lab test that is abnormal or we need to change your treatment, we will call you to review the results.   Testing/Procedures: None Ordered   Follow-Up: At [facility], you and your health needs are our priority.  As part of our continuing mission to provide you with exceptional heart care, we have created designated Provider Care Teams.  These Care Teams include your primary Cardiologist (physician) and Advanced Practice Providers (APPs -  Physician Assistants and Nurse Practitioners) who all work together to provide  you with the care you need, when you need it.  We recommend signing up for the patient portal called "MyChart".  Sign up information is provided on this After  Visit Summary.  MyChart is used to connect with patients for Virtual Visits (Telemedicine).  Patients are able to view lab/test results, encounter notes, upcoming appointments, etc.  Non-urgent messages can be sent to your provider as well.   To learn more about what you can do with MyChart, go to [email or web address].    Your next appointment:   Follow up as needed   The format for your next appointment:   In Person  Provider:   Kate Sable, MD   Other Instructions      Signed, Kate Sable, MD  10/09/2019 5:16 PM    [place]

## 2019-10-09 NOTE — Patient Instructions (Signed)

## 2019-10-10 ENCOUNTER — Ambulatory Visit
Admission: RE | Admit: 2019-10-10 | Discharge: 2019-10-10 | Disposition: A | Discharge status: Home / Self Care | Payer: Managed Care, Other (non HMO) | Source: Ambulatory Visit | Attending: Family Medicine | Admitting: Family Medicine

## 2019-10-10 DIAGNOSIS — Z1231 Encounter for screening mammogram for malignant neoplasm of breast: Secondary | ICD-10-CM | POA: Diagnosis not present

## 2019-10-16 ENCOUNTER — Ambulatory Visit
Admission: RE | Admit: 2019-10-16 | Discharge: 2019-10-16 | Disposition: A | Discharge status: Home / Self Care | Payer: Managed Care, Other (non HMO) | Source: Ambulatory Visit | Attending: Otolaryngology | Admitting: Otolaryngology

## 2019-10-16 ENCOUNTER — Other Ambulatory Visit: Payer: Self-pay

## 2019-10-16 DIAGNOSIS — H9311 Tinnitus, right ear: Secondary | ICD-10-CM

## 2019-10-16 MED ORDER — GADOBUTROL 1 MMOL/ML IV SOLN
7.5000 mL | Freq: Once | INTRAVENOUS | Status: AC | PRN
  Administered 2019-10-16: 7.5 mL via INTRAVENOUS

## 2019-11-16 ENCOUNTER — Encounter: Payer: Self-pay | Admitting: Family Medicine

## 2019-11-16 ENCOUNTER — Ambulatory Visit (INDEPENDENT_AMBULATORY_CARE_PROVIDER_SITE_OTHER): Payer: Managed Care, Other (non HMO) | Admitting: Family Medicine

## 2019-11-16 ENCOUNTER — Other Ambulatory Visit: Payer: Self-pay

## 2019-11-16 VITALS — BP 142/68 | HR 68 | Temp 96.5°F | Ht 65.0 in | Wt 194.2 lb

## 2019-11-16 DIAGNOSIS — E119 Type 2 diabetes mellitus without complications: Secondary | ICD-10-CM | POA: Diagnosis not present

## 2019-11-16 DIAGNOSIS — Z23 Encounter for immunization: Secondary | ICD-10-CM

## 2019-11-16 DIAGNOSIS — R739 Hyperglycemia, unspecified: Secondary | ICD-10-CM

## 2019-11-16 LAB — POCT GLYCOSYLATED HEMOGLOBIN (HGB A1C): Hemoglobin A1C: 5.5 % (ref 4.0–5.6)

## 2019-11-16 NOTE — Assessment & Plan Note (Addendum)
Not DM2 by A1c.  Continue work on diet and exercise.   Labs discussed with patient.  A1c 5.5.  She agrees.  I thanked her for her effort.  We can recheck periodically.

## 2019-11-16 NOTE — Progress Notes (Signed)
This visit occurred during the SARS-CoV-2 public health emergency.  Safety protocols were in place, including screening questions prior to the visit, additional usage of staff PPE, and extensive cleaning of exam room while observing appropriate contact time as indicated for disinfecting solutions.  H/o DM but not currently based on A1c.   No meds.   Intentional weight loss.  She is working on diet and exercise.   Hypoglycemic episodes: no Hyperglycemic episodes: no Feet problems: no tinging Blood Sugars averaging: up to 122 with dawn effect.  Has been lower o/w.   A1c 5.5.  D/w pt at OV.    Flu shot done today.  Covid posterior shot discussed.  See after visit summary.  Meds, vitals, and allergies reviewed.   ROS: Per HPI unless specifically indicated in ROS section   GEN: nad, alert and oriented HEENT: ncat NECK: supple w/o LA CV: rrr. PULM: ctab, no inc wob ABD: soft, +bs EXT: no edema SKIN: no acute rash

## 2019-11-16 NOTE — Patient Instructions (Signed)
Flu shot today.  Pfizer booster later in October.  Recheck with labs prior to a physical in spring 2022.   Thanks for your effort.  Take care.  Glad to see you.

## 2019-12-05 ENCOUNTER — Ambulatory Visit (INDEPENDENT_AMBULATORY_CARE_PROVIDER_SITE_OTHER): Payer: Managed Care, Other (non HMO) | Admitting: Podiatry

## 2019-12-05 ENCOUNTER — Other Ambulatory Visit: Payer: Self-pay

## 2019-12-05 ENCOUNTER — Ambulatory Visit (INDEPENDENT_AMBULATORY_CARE_PROVIDER_SITE_OTHER): Payer: Managed Care, Other (non HMO)

## 2019-12-05 DIAGNOSIS — M7752 Other enthesopathy of left foot: Secondary | ICD-10-CM | POA: Diagnosis not present

## 2019-12-05 DIAGNOSIS — M7742 Metatarsalgia, left foot: Secondary | ICD-10-CM | POA: Diagnosis not present

## 2019-12-05 DIAGNOSIS — M2042 Other hammer toe(s) (acquired), left foot: Secondary | ICD-10-CM | POA: Diagnosis not present

## 2019-12-05 DIAGNOSIS — M898X7 Other specified disorders of bone, ankle and foot: Secondary | ICD-10-CM

## 2019-12-05 NOTE — Progress Notes (Signed)
HPI: 64 y.o. female presenting today for evaluation of multiple complaints regarding the left foot.  Patient states that she was diagnosed previously by another physician here in our office for hammertoes to the fourth and fifth digits of the left foot.  Patient states that she gets symptomatic calluses between the fourth and fifth digits that are very painful and tender despite conservative treatment.  She has also been dealing with painful prominence to the dorsal aspect of the right midfoot.  She tries to wear shoes that do not irritate this area however it is chronically irritating and painful despite any shoes that go over the foot. Last fall the patient also states that she has generalized forefoot pain under the second third and fourth toes of the left foot.  Is very symptomatic she states that she feels lumps at the bottom of the foot.  Past Medical History:  Diagnosis Date  . Allergic rhinitis   . Diabetes mellitus without complication ([place])   . Difficult intubation   . Fatty liver   . GERD (gastroesophageal reflux disease)   . Hypertension   . Irregular menses    with neg endometrial biopsy 2013  . Menorrhagia   . Murmur, cardiac   . Pneumonia    2014  . PONV (postoperative nausea and vomiting)      Physical Exam: General: The patient is alert and oriented x3 in no acute distress.  Dermatology: Skin is warm, dry and supple bilateral lower extremities. Negative for open lesions or macerations.  Symptomatic hyperkeratotic preulcerative callus lesions noted to the fourth and fifth digits of the left foot  Vascular: Palpable pedal pulses bilaterally. No edema or erythema noted. Capillary refill within normal limits.  Neurological: Epicritic and protective threshold grossly intact bilaterally.   Musculoskeletal Exam: Range of motion within normal limits to all pedal and ankle joints bilateral. Muscle strength 5/5 in all groups bilateral.  Pain to palpation overlying the dorsal  first metatarsal cuneiform joint of the left foot with a bony prominence noted.  There is also symptomatic hammertoes and callus lesions to the fourth and fifth digits of the left foot.  Finally there is pain on palpation and range of motion the second third and fourth MTPJ's of the left foot consistent with metatarsalgia.  Radiographic exam: Joint spaces preserved. No fracture/dislocation/boney destruction.  Dorsal exostosis noted on lateral view at the first metatarsocuneiform joint.  This directly correlates with the osseous prominence that has been irritating the patient for several months.  There is also osseous prominence noted to the medial aspect of the left fifth digit  Assessment: 1.  Metatarsalgia left/metatarsophalangeal capsulitis 2, 3, 4 left 2.  Hammertoe fourth digit left 3.  Exostosis fifth digit left 4.  Exostosis first metatarsocuneiform joint left dorsal   Plan of Care:  1. Patient evaluated. X-Rays reviewed.  2. Today we discussed the conservative versus surgical management of the presenting pathology. The patient opts for surgical management. All possible complications and details of the procedure were explained. All patient questions were answered. No guarantees were expressed or implied. 3. Authorization for surgery was initiated today. Surgery will consist of PIPJ arthroplasty fourth digit left.  Exostectomy fifth digit left.  Exostectomy first metatarsocuneiform joint left. 4.  Appointment with Pedorthist for custom molded orthotics to help offload pressure from the forefoot.  Medically necessary. 5.  Offloading felt metatarsal pads were provided for the patient and applied to the insoles the shoes 6.  Return to clinic 1 week postop  Edrick Kins, DPM [facility]  Dr. Edrick Kins, DPM    [address], [place] [address]                Office [phone]  Fax [phone]

## 2019-12-06 ENCOUNTER — Telehealth: Payer: Self-pay | Admitting: Podiatry

## 2019-12-06 ENCOUNTER — Ambulatory Visit (INDEPENDENT_AMBULATORY_CARE_PROVIDER_SITE_OTHER): Payer: Managed Care, Other (non HMO) | Admitting: Orthotics

## 2019-12-06 DIAGNOSIS — M216X1 Other acquired deformities of right foot: Secondary | ICD-10-CM

## 2019-12-06 DIAGNOSIS — M898X7 Other specified disorders of bone, ankle and foot: Secondary | ICD-10-CM

## 2019-12-06 DIAGNOSIS — M7752 Other enthesopathy of left foot: Secondary | ICD-10-CM

## 2019-12-06 DIAGNOSIS — M2042 Other hammer toe(s) (acquired), left foot: Secondary | ICD-10-CM

## 2019-12-06 DIAGNOSIS — L84 Corns and callosities: Secondary | ICD-10-CM

## 2019-12-06 DIAGNOSIS — E119 Type 2 diabetes mellitus without complications: Secondary | ICD-10-CM

## 2019-12-06 DIAGNOSIS — M7742 Metatarsalgia, left foot: Secondary | ICD-10-CM

## 2019-12-06 DIAGNOSIS — M216X2 Other acquired deformities of left foot: Secondary | ICD-10-CM | POA: Diagnosis not present

## 2019-12-06 NOTE — Addendum Note (Signed)
Addended by: Velora Heckler on: 12/06/2019 01:25 PM   Modules accepted: Level of Service

## 2019-12-06 NOTE — Progress Notes (Signed)
Patient cast today for CMFO to address painful metatarsalgia, forefoot capsulitis.  Plan on device that will hug arch to relieve ground reaction forces, and met pad to disperse pressure.  She was given dx codes to call [employer] to see if she could get an idea of they would pay.  She signed ABN/FRF

## 2019-12-06 NOTE — Telephone Encounter (Signed)
Pt called insurance company([employer]) and Tanisha from [facility] called me. The codes in the chart are not covering the orthotics. Is there anything you add to see if we can get the orthotics covered. Let me know and I can call pt with code for her to verify with insurance.

## 2019-12-07 ENCOUNTER — Encounter: Payer: Self-pay | Admitting: Podiatry

## 2019-12-20 ENCOUNTER — Telehealth: Payer: Self-pay

## 2019-12-20 NOTE — Telephone Encounter (Signed)
DOS 01/04/2020  TARSAL EXOSTECTOMY LT - 10258 HAMMERTOE REPAIR 4TH LT - 28285 EXOSTECTOMY 5TH LT - 28108  [employer] EFFECTIVE DATE - 02/16/2018  PLAN DEDUCTIBLE - $3000.00 W/ $3000.00 MET OUT OF POCKET - $6000.00 W/ $3398.07 MET COPAY $0.00 COINSURANCE - 90%  PER AUTOMATED SYSTEM NO PRECERT REQUIRED FOR THE FOLLOWING CPT CODES: 52778 - CONF# [id number] - CONF # [id number] - CONF # [phone]

## 2020-01-04 ENCOUNTER — Other Ambulatory Visit: Payer: Self-pay | Admitting: Podiatry

## 2020-01-04 DIAGNOSIS — M85672 Other cyst of bone, left ankle and foot: Secondary | ICD-10-CM | POA: Diagnosis not present

## 2020-01-04 DIAGNOSIS — M2042 Other hammer toe(s) (acquired), left foot: Secondary | ICD-10-CM | POA: Diagnosis not present

## 2020-01-04 MED ORDER — ACETAMINOPHEN-CODEINE #3 300-30 MG PO TABS: 1.0000 | ORAL_TABLET | ORAL | 0 refills | Status: DC | PRN

## 2020-01-04 NOTE — Progress Notes (Signed)
PRN postop 

## 2020-01-09 ENCOUNTER — Other Ambulatory Visit: Payer: Self-pay

## 2020-01-09 ENCOUNTER — Ambulatory Visit (INDEPENDENT_AMBULATORY_CARE_PROVIDER_SITE_OTHER): Payer: Managed Care, Other (non HMO) | Admitting: Podiatry

## 2020-01-09 ENCOUNTER — Ambulatory Visit (INDEPENDENT_AMBULATORY_CARE_PROVIDER_SITE_OTHER): Payer: Managed Care, Other (non HMO)

## 2020-01-09 DIAGNOSIS — M2042 Other hammer toe(s) (acquired), left foot: Secondary | ICD-10-CM | POA: Diagnosis not present

## 2020-01-09 DIAGNOSIS — Z9889 Other specified postprocedural states: Secondary | ICD-10-CM

## 2020-01-09 NOTE — Progress Notes (Signed)
   Subjective:  Patient presents today status post exostectomy left foot with repair of hammertoes fourth and fifth digit left.. DOS: 01/04/2020.  Patient states she is doing very well.  She is kept the dressings clean dry and intact as instructed.  She currently is not taking anything for the pain except for Tylenol 500 mg.  No new complaints at this time.  Past Medical History:  Diagnosis Date  . Allergic rhinitis   . Diabetes mellitus without complication ([place])   . Difficult intubation   . Fatty liver   . GERD (gastroesophageal reflux disease)   . Hypertension   . Irregular menses    with neg endometrial biopsy 2013  . Menorrhagia   . Murmur, cardiac   . Pneumonia    2014  . PONV (postoperative nausea and vomiting)       Objective/Physical Exam Neurovascular status intact.  Skin incisions appear to be well coapted with sutures intact. No sign of infectious process noted. No dehiscence. No active bleeding noted. Moderate edema noted to the surgical extremity.  Radiographic Exam:  Osteotomies sites appear to be stable with routine healing.  Assessment: 1. s/p exostectomy left foot with repair of hammertoe fourth and fifth left.. DOS: 01/04/2020   Plan of Care:  1. Patient was evaluated. X-rays reviewed 2.  Dressings changed today.  Keep clean dry and intact x1 week 3.  Discontinue cam boot.  Postsurgical shoe dispensed.  Weightbearing as tolerated 4.  Return to clinic in 1 week for possible suture removal   Edrick Kins, DPM [facility]  Dr. Edrick Kins, DPM    [address], [place] [address]                Office [phone]  Fax [phone]

## 2020-01-16 ENCOUNTER — Other Ambulatory Visit: Payer: Self-pay

## 2020-01-16 ENCOUNTER — Ambulatory Visit (INDEPENDENT_AMBULATORY_CARE_PROVIDER_SITE_OTHER): Payer: Managed Care, Other (non HMO) | Admitting: Podiatry

## 2020-01-16 DIAGNOSIS — Z9889 Other specified postprocedural states: Secondary | ICD-10-CM

## 2020-01-16 DIAGNOSIS — M2042 Other hammer toe(s) (acquired), left foot: Secondary | ICD-10-CM

## 2020-01-16 NOTE — Progress Notes (Signed)
   Subjective:  Patient presents today status post exostectomy left foot with repair of hammertoes fourth and fifth digit left.. DOS: 01/04/2020.  Patient continues to have some intermittent pain to the foot.  She has been weightbearing in the surgical shoe as instructed.  No new complaints at this time  Past Medical History:  Diagnosis Date  . Allergic rhinitis   . Diabetes mellitus without complication ([place])   . Difficult intubation   . Fatty liver   . GERD (gastroesophageal reflux disease)   . Hypertension   . Irregular menses    with neg endometrial biopsy 2013  . Menorrhagia   . Murmur, cardiac   . Pneumonia    2014  . PONV (postoperative nausea and vomiting)       Objective/Physical Exam Neurovascular status intact.  Skin incisions appear to be well coapted with sutures intact. No sign of infectious process noted. No dehiscence. No active bleeding noted. Moderate edema noted to the surgical extremity.   Assessment: 1. s/p exostectomy left foot with repair of hammertoe fourth and fifth left.. DOS: 01/04/2020   Plan of Care:  1. Patient was evaluated.  Sutures removed today 2.  There is really no swelling noted.  Recommend good cotton supportive sock and postsurgical shoe 3.  Patient may begin to transfer out of the postsurgical shoe and good supportive sneakers 4.  Return to clinic in 3 weeks for follow-up x-ray   Edrick Kins, DPM [facility]  Dr. Edrick Kins, DPM    [address], [place] [address]                Office [phone]  Fax [phone]

## 2020-01-30 ENCOUNTER — Encounter: Payer: Managed Care, Other (non HMO) | Admitting: Podiatry

## 2020-02-06 ENCOUNTER — Other Ambulatory Visit: Payer: Self-pay

## 2020-02-06 ENCOUNTER — Ambulatory Visit (INDEPENDENT_AMBULATORY_CARE_PROVIDER_SITE_OTHER): Payer: Managed Care, Other (non HMO) | Admitting: Podiatry

## 2020-02-06 ENCOUNTER — Ambulatory Visit (INDEPENDENT_AMBULATORY_CARE_PROVIDER_SITE_OTHER): Payer: Managed Care, Other (non HMO)

## 2020-02-06 DIAGNOSIS — Z9889 Other specified postprocedural states: Secondary | ICD-10-CM

## 2020-02-06 DIAGNOSIS — M2042 Other hammer toe(s) (acquired), left foot: Secondary | ICD-10-CM

## 2020-02-07 NOTE — Progress Notes (Signed)
   Subjective:  Patient presents today status post exostectomy left foot with repair of hammertoes fourth and fifth digit left.. DOS: 01/04/2020.  Patient is doing very well.  She does have some intermittent tenderness on occasion with surgical sites.  Otherwise she is wearing regular shoes with significant improvement.  She presents for further treatment evaluation  Past Medical History:  Diagnosis Date  . Allergic rhinitis   . Diabetes mellitus without complication ([place])   . Difficult intubation   . Fatty liver   . GERD (gastroesophageal reflux disease)   . Hypertension   . Irregular menses    with neg endometrial biopsy 2013  . Menorrhagia   . Murmur, cardiac   . Pneumonia    2014  . PONV (postoperative nausea and vomiting)       Objective/Physical Exam Neurovascular status intact.  Skin incisions appear to be well coapted and healed. No sign of infectious process noted. No dehiscence. No active bleeding noted. Moderate edema noted specifically to the left 4th toe however this is significantly improved since last visit   Assessment: 1. s/p exostectomy left foot with repair of hammertoe fourth and fifth left.. DOS: 01/04/2020   Plan of Care:  1. Patient was evaluated.  2.  Patient may now resume full activity no restrictions.  I explained to the patient that the edema to the toe should decrease over time.  There is already been significant improvement since last visit 3.  Recommend good supportive shoes that do not constrict the toebox area 4.  Return to clinic as needed  Edrick Kins, DPM [facility]  Dr. Edrick Kins, DPM    [address], [place] [address]                Office [phone]  Fax [phone]

## 2020-02-29 ENCOUNTER — Other Ambulatory Visit: Payer: Self-pay | Admitting: Podiatry

## 2020-02-29 DIAGNOSIS — Z9889 Other specified postprocedural states: Secondary | ICD-10-CM

## 2020-02-29 DIAGNOSIS — M2042 Other hammer toe(s) (acquired), left foot: Secondary | ICD-10-CM

## 2020-03-12 ENCOUNTER — Ambulatory Visit (INDEPENDENT_AMBULATORY_CARE_PROVIDER_SITE_OTHER): Payer: Managed Care, Other (non HMO) | Admitting: Podiatry

## 2020-03-12 ENCOUNTER — Other Ambulatory Visit: Payer: Self-pay

## 2020-03-12 DIAGNOSIS — Z9889 Other specified postprocedural states: Secondary | ICD-10-CM

## 2020-03-12 NOTE — Progress Notes (Signed)
   Subjective:  Patient presents today status post exostectomy left foot with repair of hammertoes fourth and fifth digit left.. DOS: 01/04/2020.  Patient continues to have some mild tenderness throughout the day.  She does state that she has some bruising and darkness around the toe and incision site of the screw removal and exostectomy.  Otherwise no new complaints at this time  Past Medical History:  Diagnosis Date  . Allergic rhinitis   . Diabetes mellitus without complication ([place])   . Difficult intubation   . Fatty liver   . GERD (gastroesophageal reflux disease)   . Hypertension   . Irregular menses    with neg endometrial biopsy 2013  . Menorrhagia   . Murmur, cardiac   . Pneumonia    2014  . PONV (postoperative nausea and vomiting)       Objective/Physical Exam Neurovascular status intact.  Skin incisions appear to be well coapted and healed. No sign of infectious process noted. No dehiscence. No active bleeding noted.  Minimal edema noted specifically to the left 4th toe however this is significantly improved since last visit.  Overall the patient is doing much better despite having some pain and sensitivity diffusely throughout the foot   Assessment: 1. s/p exostectomy left foot with repair of hammertoe fourth and fifth left.. DOS: 01/04/2020   Plan of Care:  1. Patient was evaluated.  2.  Patient may continue to resume full activity no restrictions.  Recommend rest as needed 3.  Recommend shoes that do not constrict the toebox area 4.  Return to clinic in 2 months for final follow-up and x-ray  Edrick Kins, DPM [facility]  Dr. Edrick Kins, DPM    [address], [place] [address]                Office [phone]  Fax [phone]

## 2020-04-03 ENCOUNTER — Ambulatory Visit: Payer: Managed Care, Other (non HMO) | Admitting: Orthotics

## 2020-04-03 ENCOUNTER — Other Ambulatory Visit: Payer: Self-pay

## 2020-04-03 DIAGNOSIS — Z9889 Other specified postprocedural states: Secondary | ICD-10-CM

## 2020-04-03 DIAGNOSIS — M2042 Other hammer toe(s) (acquired), left foot: Secondary | ICD-10-CM

## 2020-04-03 DIAGNOSIS — M7742 Metatarsalgia, left foot: Secondary | ICD-10-CM

## 2020-04-03 DIAGNOSIS — M7752 Other enthesopathy of left foot: Secondary | ICD-10-CM

## 2020-04-03 NOTE — Progress Notes (Signed)
Patient came in today to pick up custom made foot orthotics.  The goals were accomplished and the patient reported no dissatisfaction with said orthotics.  Patient was advised of breakin period and how to report any issues. 

## 2020-04-10 ENCOUNTER — Encounter: Payer: Self-pay | Admitting: Family Medicine

## 2020-04-14 ENCOUNTER — Other Ambulatory Visit: Payer: Self-pay | Admitting: Family Medicine

## 2020-04-14 DIAGNOSIS — E119 Type 2 diabetes mellitus without complications: Secondary | ICD-10-CM

## 2020-04-14 DIAGNOSIS — E559 Vitamin D deficiency, unspecified: Secondary | ICD-10-CM

## 2020-05-09 ENCOUNTER — Other Ambulatory Visit (INDEPENDENT_AMBULATORY_CARE_PROVIDER_SITE_OTHER): Payer: Managed Care, Other (non HMO)

## 2020-05-09 ENCOUNTER — Other Ambulatory Visit: Payer: Managed Care, Other (non HMO)

## 2020-05-09 ENCOUNTER — Other Ambulatory Visit: Payer: Self-pay

## 2020-05-09 DIAGNOSIS — E559 Vitamin D deficiency, unspecified: Secondary | ICD-10-CM | POA: Diagnosis not present

## 2020-05-09 DIAGNOSIS — E119 Type 2 diabetes mellitus without complications: Secondary | ICD-10-CM

## 2020-05-09 LAB — CBC WITH DIFFERENTIAL/PLATELET
Basophils Absolute: 0 10*3/uL (ref 0.0–0.1)
Basophils Relative: 0.6 % (ref 0.0–3.0)
Eosinophils Absolute: 0.1 10*3/uL (ref 0.0–0.7)
Eosinophils Relative: 1.1 % (ref 0.0–5.0)
HCT: 42 % (ref 36.0–46.0)
Hemoglobin: 14.4 g/dL (ref 12.0–15.0)
Lymphocytes Relative: 15.8 % (ref 12.0–46.0)
Lymphs Abs: 1.2 10*3/uL (ref 0.7–4.0)
MCHC: 34.2 g/dL (ref 30.0–36.0)
MCV: 95.3 fl (ref 78.0–100.0)
Monocytes Absolute: 0.6 10*3/uL (ref 0.1–1.0)
Monocytes Relative: 7.3 % (ref 3.0–12.0)
Neutro Abs: 5.9 10*3/uL (ref 1.4–7.7)
Neutrophils Relative %: 75.2 % (ref 43.0–77.0)
Platelets: 225 10*3/uL (ref 150.0–400.0)
RBC: 4.41 Mil/uL (ref 3.87–5.11)
RDW: 12 % (ref 11.5–15.5)
WBC: 7.9 10*3/uL (ref 4.0–10.5)

## 2020-05-09 LAB — COMPREHENSIVE METABOLIC PANEL
ALT: 24 U/L (ref 0–35)
AST: 19 U/L (ref 0–37)
Albumin: 4.6 g/dL (ref 3.5–5.2)
Alkaline Phosphatase: 64 U/L (ref 39–117)
BUN: 19 mg/dL (ref 6–23)
CO2: 30 mEq/L (ref 19–32)
Calcium: 10.3 mg/dL (ref 8.4–10.5)
Chloride: 103 mEq/L (ref 96–112)
Creatinine, Ser: 1 mg/dL (ref 0.40–1.20)
GFR: 59.75 mL/min — ABNORMAL LOW (ref >60.00)
Glucose, Bld: 95 mg/dL (ref 70–99)
Potassium: 4.1 mEq/L (ref 3.5–5.1)
Sodium: 140 mEq/L (ref 135–145)
Total Bilirubin: 0.5 mg/dL (ref 0.2–1.2)
Total Protein: 7.6 g/dL (ref 6.0–8.3)

## 2020-05-09 LAB — LIPID PANEL
Cholesterol: 240 mg/dL — ABNORMAL HIGH (ref 0–200)
HDL: 41.6 mg/dL (ref >39.00)
NonHDL: 198.1
Total CHOL/HDL Ratio: 6
Triglycerides: 243 mg/dL — ABNORMAL HIGH (ref 0.0–149.0)
VLDL: 48.6 mg/dL — ABNORMAL HIGH (ref 0.0–40.0)

## 2020-05-09 LAB — HEMOGLOBIN A1C: Hgb A1c MFr Bld: 5.2 % (ref 4.6–6.5)

## 2020-05-09 LAB — VITAMIN D 25 HYDROXY (VIT D DEFICIENCY, FRACTURES): VITD: 43.47 ng/mL (ref 30.00–100.00)

## 2020-05-09 LAB — TSH: TSH: 1.09 u[IU]/mL (ref 0.35–4.50)

## 2020-05-09 LAB — LDL CHOLESTEROL, DIRECT: Direct LDL: 120 mg/dL

## 2020-05-16 ENCOUNTER — Ambulatory Visit (INDEPENDENT_AMBULATORY_CARE_PROVIDER_SITE_OTHER): Payer: Managed Care, Other (non HMO) | Admitting: Family Medicine

## 2020-05-16 ENCOUNTER — Other Ambulatory Visit: Payer: Self-pay

## 2020-05-16 ENCOUNTER — Encounter: Payer: Self-pay | Admitting: Family Medicine

## 2020-05-16 VITALS — BP 140/80 | HR 84 | Temp 97.7°F | Ht 65.0 in | Wt 178.0 lb

## 2020-05-16 DIAGNOSIS — R300 Dysuria: Secondary | ICD-10-CM | POA: Diagnosis not present

## 2020-05-16 DIAGNOSIS — Z0000 Encounter for general adult medical examination without abnormal findings: Secondary | ICD-10-CM | POA: Diagnosis not present

## 2020-05-16 DIAGNOSIS — Z87440 Personal history of urinary (tract) infections: Secondary | ICD-10-CM

## 2020-05-16 DIAGNOSIS — E042 Nontoxic multinodular goiter: Secondary | ICD-10-CM | POA: Diagnosis not present

## 2020-05-16 DIAGNOSIS — I10 Essential (primary) hypertension: Secondary | ICD-10-CM

## 2020-05-16 DIAGNOSIS — Z7189 Other specified counseling: Secondary | ICD-10-CM

## 2020-05-16 LAB — POC URINALSYSI DIPSTICK (AUTOMATED)
Bilirubin, UA: NEGATIVE
Glucose, UA: NEGATIVE
Ketones, UA: NEGATIVE
Leukocytes, UA: NEGATIVE
Nitrite, UA: NEGATIVE
Protein, UA: NEGATIVE
Spec Grav, UA: 1.015 (ref 1.010–1.025)
Urobilinogen, UA: 0.2 E.U./dL
pH, UA: 6 (ref 5.0–8.0)

## 2020-05-16 MED ORDER — PRAMOXINE-HC 1-2.5 % EX CREA: TOPICAL_CREAM | Freq: Three times a day (TID) | CUTANEOUS | 3 refills | Status: DC | PRN

## 2020-05-16 MED ORDER — SPIRONOLACTONE 25 MG PO TABS: 25.0000 mg | ORAL_TABLET | Freq: Every day | ORAL | 3 refills | Status: DC

## 2020-05-16 NOTE — Patient Instructions (Signed)
Go to the lab on the way out.   If you have mychart we'll likely use that to update you.    Don't change your meds for now.  Take care.  Glad to see you.

## 2020-05-16 NOTE — Progress Notes (Signed)
This visit occurred during the SARS-CoV-2 public health emergency.  Safety protocols were in place, including screening questions prior to the visit, additional usage of staff PPE, and extensive cleaning of exam room while observing appropriate contact time as indicated for disinfecting solutions.  CPE- See plan.  Routine anticipatory guidance given to patient.  See health maintenance.  The possibility exists that previously documented standard health maintenance information may have been brought forward from a previous encounter into this note.  If needed, that same information has been updated to reflect the current situation based on today's encounter.    Tetanus 2011 Flu 2021 Shingles deferred by patient.  PNA prev done 2014 covid vaccine 2021 Pap per gyn.I'll defer.  DXA not due Colonoscopy 2019 Mammogram to be done after getting covid vaccine.   Living will d/w pt. Husband designated if patient were incapacitated.  Diet and exercise d/w pt.   She was concerned about TSH/thyroid function due to chronic tongue enlargement.  She felt cold, noted fatigue and hair loss.  Prev u/s: Thyroid is heterogeneous with small nodules. No suspicious nodules. Nodule in the mid left thyroid lobe has minimally enlarged but that is probably related to increased cystic component.  She has noted occasional heart pounding that can happen w/o exercise.  She can exercise w/o heart pounding.  She does not have symptoms that are worse with exertion.  I question if this is in any way related to her thyroid and I would like endocrine input.  She has has auro w/o migraine.  With neg MRI recently.  Discussed.  She had a previous headache that lasted for a few days without sore throat or fever.  All symptoms resolved in the meantime.  No new neurological red flag symptoms.  Hypertension:    Using medication without problems or lightheadedness: yes Chest pain with exertion:no Edema:no Short of breath:no  She  has history of R lower abd pain, h/o similar with prev UTIs.  She has tolerated fosfomycin in the last.  Sx over the last week or so.  ucx pending.  See notes on labs.  She needed refill for cream for prn tx of hemorrhoids.  Prescription sent.  PMH and SH reviewed  Meds, vitals, and allergies reviewed.   ROS: Per HPI.  Unless specifically indicated otherwise in HPI, the patient denies:  General: fever. Eyes: acute vision changes ENT: sore throat Cardiovascular: chest pain Respiratory: SOB GI: vomiting GU: dysuria Musculoskeletal: acute back pain Derm: acute rash Neuro: acute motor dysfunction Psych: worsening mood Endocrine: polydipsia Heme: bleeding Allergy: hayfever  GEN: nad, alert and oriented HEENT: ncat NECK: supple w/o LA CV: rrr. PULM: ctab, no inc wob ABD: soft, +bs, not tender to palpation. EXT: no edema SKIN: no acute rash

## 2020-05-17 LAB — URINE CULTURE
MICRO NUMBER:: 11716396
Result:: NO GROWTH
SPECIMEN QUALITY:: ADEQUATE

## 2020-05-17 LAB — T3, FREE: T3, Free: 2.9 pg/mL (ref 2.3–4.2)

## 2020-05-17 LAB — T4, FREE: Free T4: 1.05 ng/dL (ref 0.60–1.60)

## 2020-05-18 ENCOUNTER — Encounter: Payer: Self-pay | Admitting: Family Medicine

## 2020-05-19 ENCOUNTER — Encounter: Payer: Self-pay | Admitting: Family Medicine

## 2020-05-19 DIAGNOSIS — Z87440 Personal history of urinary (tract) infections: Secondary | ICD-10-CM | POA: Insufficient documentation

## 2020-05-19 NOTE — Assessment & Plan Note (Signed)
Tetanus 2011 Flu 2021 Shingles deferred by patient.  PNA prev done 2014 covid vaccine 2021 Pap per gyn.I'll defer.  DXA not due Colonoscopy 2019 Mammogram to be done after getting covid vaccine.   Living will d/w pt. Husband designated if patient were incapacitated.  Diet and exercise d/w pt.

## 2020-05-19 NOTE — Assessment & Plan Note (Signed)
Living will d/w pt.  Husband designated if patient were incapacitated.  

## 2020-05-19 NOTE — Assessment & Plan Note (Signed)
Continue spironolactone as is.  Continue work on diet and exercise.  She agrees.  Labs discussed with patient.

## 2020-05-19 NOTE — Assessment & Plan Note (Signed)
Check follow-up T3 and T4 and refer to endocrinology.  See notes on labs.

## 2020-05-19 NOTE — Assessment & Plan Note (Signed)
Regional check urine culture.  See notes on labs.

## 2020-05-27 ENCOUNTER — Ambulatory Visit (INDEPENDENT_AMBULATORY_CARE_PROVIDER_SITE_OTHER): Payer: Managed Care, Other (non HMO) | Admitting: Internal Medicine

## 2020-05-27 ENCOUNTER — Other Ambulatory Visit: Payer: Self-pay

## 2020-05-27 ENCOUNTER — Encounter: Payer: Self-pay | Admitting: Internal Medicine

## 2020-05-27 VITALS — BP 130/80 | HR 89 | Ht 65.0 in | Wt 179.4 lb

## 2020-05-27 DIAGNOSIS — E042 Nontoxic multinodular goiter: Secondary | ICD-10-CM | POA: Insufficient documentation

## 2020-05-27 DIAGNOSIS — E281 Androgen excess: Secondary | ICD-10-CM

## 2020-05-27 NOTE — Progress Notes (Signed)
Name: Alexandria Fry  MRN/ DOB: [id number], [birth year]    Age/ Sex: 65 y.o., female    PCP: Alexandria Ghent, MD   Reason for Endocrinology Evaluation: MNG     Date of Initial Endocrinology Evaluation: 05/27/2020     HPI: [name] is a 65 y.o. female with a past medical history of MNG, T2DM and HTN.  The patient presented for initial endocrinology clinic visit on 05/27/2020 for consultative assistance with her MNG.   She was diagnosed with MNG in 2011 with a thyroid ultrasound demonstrating a sub centimeter isthmic nodule. Over the years she has had serial thyroid ultrasound with stability until 2019. In 2020 the left thyroid nodule showed enlargement from sub- centimeter to 1.0 cm.    She is having enlarged and scalloped  tongue that she attributes to  Thyroid disease?  She was on levothyroxine for ~ 3 yrs but has been off for a while  She is having cold intolerance, fatigue , hair loss over the scalp and excessive hair growth over the chin, which seems to be a chronic issue but has noted decrease in the recent years.   Has has episodes of palpitations .  She has seen [facility] for elevated DHEAS at 265 ug/dL and elevated free testosterone 7.2 pg/mL, total testosterone was 48 ng/dL . 17-OH progesterone, androstenedione and TSh were all normal in 2018 . Adrenal imaging has been normal as well as pelvic US. She was subsequently diagnosed with PCOS . She declined COC due to [place] of breast cancer.   She is on Spironolactone     No FH of thyroid cancer  No radiation exposure    In review of the chart she has seen Dr. Chalmers Fry, Dr. Howell Fry and Dr. Gabriel Fry for the above concerns    HISTORY:  Past Medical History:  Past Medical History:  Diagnosis Date  . Allergic rhinitis   . Diabetes mellitus without complication ([place])   . Difficult intubation   . Fatty liver   . GERD (gastroesophageal reflux disease)   . Hypertension   . Irregular menses    with neg  endometrial biopsy 2013  . Menorrhagia   . Murmur, cardiac   . Pneumonia    2014  . PONV (postoperative nausea and vomiting)    Past Surgical History:  Past Surgical History:  Procedure Laterality Date  . ABDOMINAL HYSTERECTOMY    . CERVICAL CONE BIOPSY    . COLONOSCOPY WITH PROPOFOL N/A 01/18/2018   Procedure: COLONOSCOPY WITH PROPOFOL;  Surgeon: Alexandria Sails, MD;  Location[facility] ENDOSCOPY;  Service: Endoscopy;  Laterality: N/A;  . CYSTOSCOPY  11/11/2015   Procedure: CYSTOSCOPY;  Surgeon: Alexandria Loh Ward, MD;  Location: [facility] ORS;  Service: Gynecology;;  . DILATATION & CURETTAGE/HYSTEROSCOPY WITH MYOSURE N/A 03/21/2015   Procedure: DILATATION & CURETTAGE/HYSTEROSCOPY, POLYPECTOMY;  Surgeon: Alexandria Loh Ward, MD;  Location: [facility] ORS;  Service: Gynecology;  Laterality: N/A;  . DILATION AND CURETTAGE OF UTERUS  1981   [facility]   . DILATION AND CURETTAGE OF UTERUS  06/25/2009   normal (Dr. Laurey Fry)  . LAPAROSCOPIC BILATERAL SALPINGECTOMY Bilateral 11/11/2015   Procedure: LAPAROSCOPIC BILATERAL SALPINGECTOMY;  Surgeon: Alexandria Loh Ward, MD;  Location: [facility] ORS;  Service: Gynecology;  Laterality: Bilateral;  . LAPAROSCOPIC SUPRACERVICAL HYSTERECTOMY  11/11/2015   Procedure: LAPAROSCOPIC SUPRACERVICAL HYSTERECTOMY CONVERTED TO OPEN;  Surgeon: Alexandria Loh Ward, MD;  Location: [facility] ORS;  Service: Gynecology;;  . LAPAROTOMY  11/11/2015   Procedure: LAPAROTOMY;  Surgeon: Alexandria Loh Ward, MD;  Location: [facility] ORS;  Service: Gynecology;;  . VAGINAL DELIVERY     NSVD x 2      Social History:  reports that she quit smoking about 38 years ago. Her smoking use included cigarettes. She has a 10.00 pack-year smoking history. She has never used smokeless tobacco. She reports previous alcohol use. She reports that she does not use drugs.  Family History: family history includes Alcohol abuse in her father; Breast cancer in her mother; Cancer in her father; Cancer (age of onset: 58) in her mother; Colon cancer in  her mother; Colon polyps in her father; Diabetes in her mother; Early death (age of onset: 29) in her sister; Heart disease in her father; Hypertension in her mother; Osteoporosis in her mother.   HOME MEDICATIONS: Allergies as of 05/27/2020      Reactions   Amlodipine Other (See Comments), Swelling   edema   Azithromycin Hives   Clavulanic Acid Swelling   It is noted that the patient tolerates Augmentin 500/125 mg, but not 875/125 mg. Reaction is dose dependent on amoxicillin.   Hydrochlorothiazide Other (See Comments), Hives   Other Swelling   "made skin peel off" Feed and hands turned red   Sulfa Antibiotics Other (See Comments)   Tongue swells Augmenting,ampicillin,bactrium ds   Ace Inhibitors Other (See Comments)   Would avoid.  She has h/o lip swelling with other meds.  Tachy   Angiotensin Receptor Blockers Other (See Comments)   Would avoid.  She has h/o lip swelling with other meds.    Clonidine Derivatives Other (See Comments)   bradycardia   Diphenhydramine Hcl    Benadryl cream causes local skin irritation.  Would avoid med.  She can tolerate zyrtec.    Fentanyl Other (See Comments)   She felt diffusely awful while on med   Hydrochlorothiazide W-triamterene    Possible hives and GI upset   Hydrocodone    intolerant   Metformin And Related    rash   Metoprolol    Bradycardia.   Nitrofurantoin Other (See Comments)   rash   Oxycodone Other (See Comments)   intolerant   Tetanus Toxoids    Local swelling, can consider split dosing 2 weeks apart   Cefuroxime Axetil Rash   Rash   Levofloxacin In D5w Itching   Severe itching and flushing.      Medication List       Accurate as of May 27, 2020 12:37 PM. If you have any questions, ask your nurse or doctor.        Alrex 0.2 % Susp Generic drug: loteprednol Apply 1 drop to eye 2 (two) times daily.   Cholecalciferol 125 MCG (5000 UT) Tabs Take 5,000 Units by mouth daily.   hydrocortisone 2.5 %  cream Apply 1 application topically 2 (two) times daily.   multivitamin tablet Take 1 tablet by mouth daily.   pramoxine-hydrocortisone cream Apply topically 3 (three) times daily as needed.   spironolactone 25 MG tablet Commonly known as: ALDACTONE Take 1 tablet (25 mg total) by mouth daily.         REVIEW OF SYSTEMS: A comprehensive ROS was conducted with the patient and is negative except as per HPI    OBJECTIVE:  VS: BP 130/80   Pulse 89   Ht 5\' 5"  (1.651 m)   Wt 179 lb 6 oz (81.4 kg)   LMP  (LMP Unknown)   SpO2 98%   BMI 29.85 kg/m  Wt Readings from Last 3 Encounters:  05/27/20 179 lb 6 oz (81.4 kg)  05/16/20 178 lb (80.7 kg)  11/16/19 194 lb 4 oz (88.1 kg)     EXAM: General: Pt appears well and is in NAD  Neck: General: Supple without adenopathy. Thyroid: Thyroid size normal.  No goiter or nodules appreciated.   Lungs: Clear with good BS bilat with no rales, rhonchi, or wheezes  Heart: Auscultation: RRR.  Abdomen: Normoactive bowel sounds, soft, nontender, without masses or organomegaly palpable  Extremities:  BL UE: Normal ROM and strength. BL LE: No pretibial edema normal ROM and strength.  Skin: Hair: Thinning of the hair over the scalp, chin was recently shaved.  Skin Inspection: No rashes Skin Palpation: Skin temperature, texture, and thickness normal to palpation  Mental Status: Judgment, insight: Intact Memory: Intact for recent and remote events Mood and affect: No depression, anxiety, or agitation     DATA REVIEWED: Results for Alexandria Fry, Alexandria Fry" (MRN [id number]) as of 05/27/2020 12:42  Ref. Range 05/09/2020 09:50  Sodium Latest Ref Range: 135 - 145 mEq/L 140  Potassium Latest Ref Range: 3.5 - 5.1 mEq/L 4.1  Chloride Latest Ref Range: 96 - 112 mEq/L 103  CO2 Latest Ref Range: 19 - 32 mEq/L 30  Glucose Latest Ref Range: 70 - 99 mg/dL 95  BUN Latest Ref Range: 6 - 23 mg/dL 19  Creatinine Latest Ref Range: 0.40 - 1.20 mg/dL 1.00   Calcium Latest Ref Range: 8.4 - 10.5 mg/dL 10.3  Alkaline Phosphatase Latest Ref Range: 39 - 117 U/L 64  Albumin Latest Ref Range: 3.5 - 5.2 g/dL 4.6  AST Latest Ref Range: 0 - 37 U/L 19  ALT Latest Ref Range: 0 - 35 U/L 24  Total Protein Latest Ref Range: 6.0 - 8.3 g/dL 7.6  Total Bilirubin Latest Ref Range: 0.2 - 1.2 mg/dL 0.5  GFR Latest Ref Range: >60.00 mL/min 59.75 (L)  Results for Alexandria Fry, Alexandria Fry" (MRN [id number]) as of 05/27/2020 12:42  Ref. Range 05/16/2020 15:33  Triiodothyronine,Free,Serum Latest Ref Range: 2.3 - 4.2 pg/mL 2.9  T4,Free(Direct) Latest Ref Range: 0.60 - 1.60 ng/dL 1.05    ASSESSMENT/PLAN/RECOMMENDATIONS:   1. Multinodular Goiter:   - Pt with multiple non-specific symptoms that are NOT attributed to her thyroid  - No local neck symptoms - She has had a stable thyroid nodules from 2011 until 2020 when an increase was noted from sub-centimeter to a centimeter.  - Reassurance provided - Will proceed with an ultrasound, if stable, no further imaging will be recommened - I explained to her that the natural history of thyroid nodules is to increase over the years and after 11 years, a growth is not to be expected  - TFT's are normal , no need for LT- 4 replacement    2. PCOS:   -The main issue with PCOS in the postmenopausal years, is hyperandrogenism , which she is on spironolactone and has helped  -Postmenopausal woman with PCOS are at a higher risk for developing type 2 diabetes, and other metabolic disorders.  This has been managed by her PCP    F/U PRN  Signed electronically by: Mack Guise, MD  [facility] Endocrinology  [facility] [address], [place] [address] Phone: [phone] FAX: [phone]   CC: Alexandria Ghent, MD [place] [address] Phone: [phone] Fax: [phone]   Return to Endocrinology clinic as below: Future Appointments  Date Time  Provider [place]  05/28/2020  1:15 PM Edrick Kins, DPM [facility]

## 2020-05-28 ENCOUNTER — Encounter: Payer: Managed Care, Other (non HMO) | Admitting: Podiatry

## 2020-05-29 ENCOUNTER — Other Ambulatory Visit: Payer: Managed Care, Other (non HMO)

## 2020-06-04 LAB — DHEA-SULFATE: DHEA-SO4: 171 ug/dL — ABNORMAL HIGH (ref 9–118)

## 2020-06-04 LAB — TESTOSTERONE, TOTAL, LC/MS/MS: Testosterone, Total, LC-MS-MS: 38 ng/dL (ref 2–45)

## 2020-06-04 LAB — 17-HYDROXYPROGESTERONE: 17-OH-Progesterone, LC/MS/MS: 32 ng/dL

## 2020-06-07 ENCOUNTER — Ambulatory Visit (INDEPENDENT_AMBULATORY_CARE_PROVIDER_SITE_OTHER): Payer: Managed Care, Other (non HMO) | Admitting: Podiatry

## 2020-06-07 ENCOUNTER — Ambulatory Visit (INDEPENDENT_AMBULATORY_CARE_PROVIDER_SITE_OTHER): Payer: Managed Care, Other (non HMO)

## 2020-06-07 ENCOUNTER — Other Ambulatory Visit: Payer: Self-pay

## 2020-06-07 ENCOUNTER — Encounter: Payer: Self-pay | Admitting: Podiatry

## 2020-06-07 DIAGNOSIS — Z9889 Other specified postprocedural states: Secondary | ICD-10-CM | POA: Diagnosis not present

## 2020-06-07 DIAGNOSIS — M2042 Other hammer toe(s) (acquired), left foot: Secondary | ICD-10-CM

## 2020-06-07 DIAGNOSIS — M7752 Other enthesopathy of left foot: Secondary | ICD-10-CM

## 2020-06-12 ENCOUNTER — Ambulatory Visit
Admission: RE | Admit: 2020-06-12 | Discharge: 2020-06-12 | Disposition: A | Discharge status: Home / Self Care | Payer: Managed Care, Other (non HMO) | Source: Ambulatory Visit | Attending: Internal Medicine | Admitting: Internal Medicine

## 2020-06-12 DIAGNOSIS — E042 Nontoxic multinodular goiter: Secondary | ICD-10-CM

## 2020-06-13 ENCOUNTER — Telehealth: Payer: Self-pay | Admitting: Internal Medicine

## 2020-06-13 DIAGNOSIS — E042 Nontoxic multinodular goiter: Secondary | ICD-10-CM

## 2020-06-13 NOTE — Telephone Encounter (Signed)
Discussed thyroid ultrasound results with pt on 06/13/2020 with left mid nodule increase in size by 20% on 2 dimensions, will proceed with FNA per American Thyroid association guidelines    THYROID ULTRASOUND 05/2020   Follow-up left mid thyroid nodule  EXAM: THYROID ULTRASOUND  TECHNIQUE: Ultrasound examination of the thyroid gland and adjacent soft tissues was performed.  COMPARISON:  Thyroid ultrasound 03/14/2018  FINDINGS: Parenchymal Echotexture: Moderately heterogeneous  Isthmus: 0.4 cm  Right lobe: 5.1 x 2.0 x 1.8 cm  Left lobe: 4.5 x 1.6 x 1.8 cm  _________________________________________________________  Estimated total number of nodules >/= 1 cm: 1  Number of spongiform nodules >/=  2 cm not described below (TR1): 0  Number of mixed cystic and solid nodules >/= 1.5 cm not described below (TR2): 0  _________________________________________________________  Nodule # 2:  Prior biopsy: No  Location: Left; mid  Maximum size: 1.3 cm; Other 2 dimensions: 1.2 x 0.7 cm, previously, 1.0 x 1.0 x 0.6 cm on 03/14/2018  Composition: solid/almost completely solid (2)  Echogenicity: isoechoic (1)  Shape: not taller-than-wide (0)  Margins: ill-defined (0)  Echogenic foci: none (0)  ACR TI-RADS total points: 3.  ACR TI-RADS risk category:  TR3 (3 points).  Significant change in size (>/= 20% in two dimensions and minimal increase of 2 mm): Yes  Change in features: Yes  Change in ACR TI-RADS risk category: Yes  ACR TI-RADS recommendations:  Given size (<1.4 cm) and appearance, this nodule does NOT meet TI-RADS criteria for biopsy or dedicated follow-up.  _________________________________________________________  Additional subcentimeter thyroid nodules do not meet criteria for FNA or imaging follow-up.  IMPRESSION: Nodule 2 (TI-RADS 3) located in the mid left thyroid lobe measures 1.3 x 1.2 x 0.7 cm on the current  examination compared to 1.0 x 1.0 x 0.6 cm on the prior examination. Given its size and does not meet criteria for FNA or surveillance. However, given growth since prior exam, follow-up ultrasound should be considered in 1 year.  Abby Nena Jordan, MD  [facility] Endocrinology  [facility] [address] [place], [place] [address] Phone: [phone] FAX: [phone]

## 2020-06-23 NOTE — Progress Notes (Signed)
   Subjective:  Patient presents today status post exostectomy left foot with repair of hammertoes fourth and fifth digit left.. DOS: 01/04/2020.  Patient continues to have some tenderness and stiffness to the toes.  She states that the bottom of the forefoot burns and feels like rocks after walking on it for a while.  She presents for further treatment evaluation Past Medical History:  Diagnosis Date  . Allergic rhinitis   . Diabetes mellitus without complication ([place])   . Difficult intubation   . Fatty liver   . GERD (gastroesophageal reflux disease)   . Hypertension   . Irregular menses    with neg endometrial biopsy 2013  . Menorrhagia   . Murmur, cardiac   . Pneumonia    2014  . PONV (postoperative nausea and vomiting)       Objective/Physical Exam Neurovascular status intact.  Skin incisions appear to be well coapted and healed. No sign of infectious process noted. No dehiscence. No active bleeding noted.  Minimal edema noted specifically to the left 4th toe however this is significantly improved since last visit.  Overall the patient is doing much better despite having some pain and sensitivity diffusely throughout the foot  Radiographic exam Osteotomy sites appear healed.  No fractures or exostoses identified.  Joint spaces preserved   Assessment: 1. s/p exostectomy left foot with repair of hammertoe fourth and fifth left.. DOS: 01/04/2020   Plan of Care:  1. Patient was evaluated.  2.  Patient may continue to resume full activity no restrictions.  Recommend rest as needed 3.  Recommend OTC Voltaren 1% gel and massage into the toes daily.  Recommend range of motion exercises to the toes to break up any scar tissue adhesions 4.  Return to clinic in 2 months for follow-up  Edrick Kins, DPM [facility]  Dr. Edrick Kins, DPM    [address], [place] [address]                Office [phone]  Fax  [phone]

## 2020-06-25 ENCOUNTER — Other Ambulatory Visit (HOSPITAL_COMMUNITY)
Admission: RE | Admit: 2020-06-25 | Discharge: 2020-06-25 | Disposition: A | Discharge status: Home / Self Care | Payer: Managed Care, Other (non HMO) | Source: Ambulatory Visit | Attending: Radiology | Admitting: Radiology

## 2020-06-25 ENCOUNTER — Ambulatory Visit
Admission: RE | Admit: 2020-06-25 | Discharge: 2020-06-25 | Disposition: A | Discharge status: Home / Self Care | Payer: Managed Care, Other (non HMO) | Source: Ambulatory Visit | Attending: Internal Medicine | Admitting: Internal Medicine

## 2020-06-25 DIAGNOSIS — E042 Nontoxic multinodular goiter: Secondary | ICD-10-CM

## 2020-06-25 DIAGNOSIS — D34 Benign neoplasm of thyroid gland: Secondary | ICD-10-CM | POA: Diagnosis not present

## 2020-06-25 DIAGNOSIS — E041 Nontoxic single thyroid nodule: Secondary | ICD-10-CM | POA: Diagnosis present

## 2020-06-26 LAB — CYTOLOGY - NON PAP

## 2020-07-19 ENCOUNTER — Encounter: Payer: Self-pay | Admitting: Family Medicine

## 2020-07-22 ENCOUNTER — Other Ambulatory Visit: Payer: Self-pay

## 2020-07-22 MED ORDER — SPIRONOLACTONE 25 MG PO TABS: 25.0000 mg | ORAL_TABLET | Freq: Every day | ORAL | 3 refills | Status: DC

## 2020-07-26 ENCOUNTER — Ambulatory Visit: Payer: Managed Care, Other (non HMO) | Admitting: Dermatology

## 2020-08-06 ENCOUNTER — Other Ambulatory Visit: Payer: Self-pay | Admitting: Dermatology

## 2020-08-06 ENCOUNTER — Ambulatory Visit (INDEPENDENT_AMBULATORY_CARE_PROVIDER_SITE_OTHER): Payer: Managed Care, Other (non HMO) | Admitting: Dermatology

## 2020-08-06 ENCOUNTER — Other Ambulatory Visit: Payer: Self-pay

## 2020-08-06 VITALS — BP 178/80

## 2020-08-06 DIAGNOSIS — L649 Androgenic alopecia, unspecified: Secondary | ICD-10-CM | POA: Diagnosis not present

## 2020-08-06 DIAGNOSIS — L659 Nonscarring hair loss, unspecified: Secondary | ICD-10-CM

## 2020-08-06 DIAGNOSIS — L650 Telogen effluvium: Secondary | ICD-10-CM | POA: Diagnosis not present

## 2020-08-06 MED ORDER — KETOCONAZOLE 2 % EX SHAM: MEDICATED_SHAMPOO | CUTANEOUS | 1 refills | Status: DC

## 2020-08-06 MED ORDER — SPIRONOLACTONE 50 MG PO TABS: 50.0000 mg | ORAL_TABLET | Freq: Every day | ORAL | 1 refills | Status: DC

## 2020-08-06 NOTE — Progress Notes (Signed)
New Patient Visit  Subjective  Alexandria Fry is a 65 y.o. female who presents for the following: Hair thinning (Patient here for hairloss of the scalp that started in January 2020 around the time she had gotten sick. She had improvement until she received the Covid vaccine and boosters, worsening after each one. She had a 60 lb weightloss in 2021 and foot surgery in November of 2021. She had labs done in March 2022 to review. ). Most recent Covid infection was in May 22 and hair is starting to fall out again. Father had hair loss but no females in family with hair thinning.  Recent labs done for thyroid, CBC, Vit D were normal.  The following portions of the chart were reviewed this encounter and updated as appropriate:        Review of Systems:  No other skin or systemic complaints except as noted in HPI or Assessment and Plan.  Objective  Well appearing patient in no apparent distress; mood and affect are within normal limits.  A focused examination was performed including face, scalp. Relevant physical exam findings are noted in the Assessment and Plan.  Scalp Diffuse hair thinning of scalp; increased thinning on crown and frontal/temporal scalp; four telogen hairs on hair pull. No erythema/scale of scalp although pt reports some itching in past            Assessment & Plan  Alopecia Scalp  Telogen Effluvium with Androgenetic Alopecia  Pt has had multiple stresses on body over past couple years (Covid infection/vaccines, large weight loss, surgery) that can cause delayed hair loss (telogen effluvium)  Labs normal from 05/09/2020 (TSH, Vitamin D, Hemoglobin)   Telogen effluvium is a benign, self limited condition causing increased hair shedding usually for several months. It does not progress to baldness. It can be triggered by recent illness, recent surgery, thyroid disease, low iron stores, vitamin D deficiency, fad diets or rapid weight loss, hormonal changes such as  pregnancy or birth control pills, and some medication. Usually the hair loss starts 2-3 months after the illness or health change. Rarely, it can continue for longer than a year.  Patient is already taking Spironolactone 25mg  QD for blood pressure. BP 178/80 Increase Spironolactone 50mg  take 1 po QD as tolerated dsp #30 for antiandrogen effect.  Start ketoconazole 2% shampoo Massage into scalp and let sit several minutes before rinsing Do not recommend additional Covid boosters   Recommend Minoxidil 5% topical foam once nightly as tolerated to start, assess for side effects, since she has had some side effects with oral Ace inhibitors.  Recommend minoxidil 5% (Rogaine for men) solution or foam to be applied to the scalp and left in. This should ideally be used twice daily for best results but it helps with hair regrowth when used at least three times per week. Rogaine initially can cause increased hair shedding for the first few weeks but this will stop with continued use. In studies, people who used minoxidil (Rogaine) for at least 6 months had thicker hair than people who did not. Minoxidil topical (Rogaine) only works as long as it continues to be used. If if it is no longer used then the hair it has been helping to regrow can fall out. Minoxidil topical (Rogaine) can cause increased facial hair growth which can usually be managed easily with a battery-operated hair trimmer. If facial hair growth is bothersome, switching to the 2% women's version can decrease the risk of unwanted facial hair growth.  Spironolactone can cause increased urination and cause blood pressure to decrease. Please watch for signs of lightheadedness and be cautious when changing position. It can sometimes cause breast tenderness or an irregular period in premenopausal women. It can also increase potassium. The increase in potassium usually is not a concern unless you are taking other medicines that also increase potassium, so  please be sure your doctor knows all of the other medications you are taking. This medication should not be taken by pregnant women.  This medicine should also not be taken together with sulfa drugs like Bactrim (trimethoprim/sulfamethexazole).    spironolactone (ALDACTONE) 50 MG tablet - Scalp Take 1 tablet (50 mg total) by mouth daily.  ketoconazole (NIZORAL) 2 % shampoo - Scalp Massage into scalp and let sit several minutes before rinsing. Use 2-3x/wk.  Return in about 2 months (around 10/06/2020) for hair loss.  IJamesetta Orleans, CMA, am acting as scribe for Brendolyn Patty, MD .  Documentation: I have reviewed the above documentation for accuracy and completeness, and I agree with the above.  Brendolyn Patty MD

## 2020-08-06 NOTE — Patient Instructions (Addendum)
Telogen Effluvium  Telogen effluvium is a condition in which the body sheds much hair. People describe that they are losing hair "from the roots" in excessive amounts. The hair loss is usually 3 to 6 months following an event. The inciting event may be childbirth, a surgery, an illness with a fever, a traumatic psychological event, weight loss, or the start of a new medication. Some people have no known inciting event. For most people, no treatment is necessary, and the hair will stop falling out and begin to regrow with time. Some women develop a chronic form of telogen effluvium in which they continue to lose hair at an accelerated rate. If you have any questions or concerns for your doctor, please call our main line at [phone] and press option 4 to reach your doctor's medical assistant. If no one answers, please leave a voicemail as directed and we will return your call as soon as possible. Messages left after 4 pm will be answered the following business day.   Recommend minoxidil 5% (Rogaine for men) solution or foam to be applied to the scalp and left in. This should ideally be used twice daily for best results but it helps with hair regrowth when used at least three times per week. Rogaine initially can cause increased hair shedding for the first few weeks but this will stop with continued use. In studies, people who used minoxidil (Rogaine) for at least 6 months had thicker hair than people who did not. Minoxidil topical (Rogaine) only works as long as it continues to be used. If if it is no longer used then the hair it has been helping to regrow can fall out. Minoxidil topical (Rogaine) can cause increased facial hair growth which can usually be managed easily with a battery-operated hair trimmer. If facial hair growth is bothersome, switching to the 2% women's version can decrease the risk of unwanted facial hair growth.   You may also send [place] a message via [place]. We typically respond to  MyChart messages within 1-2 business days.  For prescription refills, please ask your pharmacy to contact our office. Our fax number is [phone].  If you have an urgent issue when the clinic is closed that cannot wait until the next business day, you can page your doctor at the number below.    Please note that while we do our best to be available for urgent issues outside of office hours, we are not available 24/7.   If you have an urgent issue and are unable to reach [place], you may choose to seek medical care at your doctor's office, retail clinic, urgent care center, or emergency room.  If you have a medical emergency, please immediately call 911 or go to the emergency department.  Pager Numbers  - Dr. Nehemiah Massed: [phone]  - Dr. Laurence Ferrari: [phone]  - Dr. Nicole Kindred: [phone]  In the event of inclement weather, please call our main line at [phone] for an update on the status of any delays or closures.  Dermatology Medication Tips: Please keep the boxes that topical medications come in in order to help keep track of the instructions about where and how to use these. Pharmacies typically print the medication instructions only on the boxes and not directly on the medication tubes.   If your medication is too expensive, please contact our office at [phone] option 4 or send [place] a message through [place].   We are unable to tell what your co-pay for medications will be in advance  as this is different depending on your insurance coverage. However, we may be able to find a substitute medication at lower cost or fill out paperwork to get insurance to cover a needed medication.   If a prior authorization is required to get your medication covered by your insurance company, please allow [place] 1-2 business days to complete this process.  Drug prices often vary depending on where the prescription is filled and some pharmacies may offer cheaper prices.  The website www.goodrx.com  contains coupons for medications through different pharmacies. The prices here do not account for what the cost may be with help from insurance (it may be cheaper with your insurance), but the website can give you the price if you did not use any insurance.  - You can print the associated coupon and take it with your prescription to the pharmacy.  - You may also stop by our office during regular business hours and pick up a GoodRx coupon card.  - If you need your prescription sent electronically to a different pharmacy, notify our office through [facility] or by phone at [phone] option 4.

## 2020-09-06 ENCOUNTER — Ambulatory Visit: Payer: Managed Care, Other (non HMO) | Admitting: Podiatry

## 2020-09-10 ENCOUNTER — Other Ambulatory Visit: Payer: Self-pay

## 2020-09-10 ENCOUNTER — Ambulatory Visit (INDEPENDENT_AMBULATORY_CARE_PROVIDER_SITE_OTHER): Payer: Managed Care, Other (non HMO) | Admitting: Podiatry

## 2020-09-10 DIAGNOSIS — M7752 Other enthesopathy of left foot: Secondary | ICD-10-CM

## 2020-09-10 DIAGNOSIS — M2042 Other hammer toe(s) (acquired), left foot: Secondary | ICD-10-CM

## 2020-09-10 NOTE — Progress Notes (Signed)
   Subjective:  Patient presents today status post exostectomy left foot with repair of hammertoes fourth and fifth digit left.. DOS: 01/04/2020.  Patient states that she continues to have improvement.  This past week she [place] and [place].  No new complaints at this time  Past Medical History:  Diagnosis Date   Allergic rhinitis    Diabetes mellitus without complication ([place])    Difficult intubation    Fatty liver    GERD (gastroesophageal reflux disease)    Hypertension    Irregular menses    with neg endometrial biopsy 2013   Menorrhagia    Murmur, cardiac    Pneumonia    2014   PONV (postoperative nausea and vomiting)       Objective/Physical Exam Neurovascular status intact.  Skin incisions appear to be well coapted and healed. No sign of infectious process noted. No dehiscence. No active bleeding noted.  Negative for any edema noted specifically to the left 4th toe.  Overall the patient is doing much better despite having some pain and sensitivity diffusely throughout the foot    Assessment: 1. s/p exostectomy left foot with repair of hammertoe fourth and fifth left.. DOS: 01/04/2020   Plan of Care:  1. Patient was evaluated.  2.  Overall the patient is doing much better.  She may resume full activity no restrictions 3.  Recommend good supportive shoes.  I did also recommend Altra shoes since they do not constrict the toebox area 4.  Return to clinic as needed  Edrick Kins, DPM [facility]  Dr. Edrick Kins, DPM    [address], [place] [address]                Office [phone]  Fax [phone]

## 2020-10-07 ENCOUNTER — Other Ambulatory Visit: Payer: Self-pay | Admitting: Family Medicine

## 2020-10-07 DIAGNOSIS — Z1231 Encounter for screening mammogram for malignant neoplasm of breast: Secondary | ICD-10-CM

## 2020-10-08 ENCOUNTER — Ambulatory Visit (INDEPENDENT_AMBULATORY_CARE_PROVIDER_SITE_OTHER): Payer: Managed Care, Other (non HMO) | Admitting: Dermatology

## 2020-10-08 ENCOUNTER — Ambulatory Visit: Payer: Managed Care, Other (non HMO) | Admitting: Dermatology

## 2020-10-08 ENCOUNTER — Other Ambulatory Visit: Payer: Self-pay

## 2020-10-08 DIAGNOSIS — L650 Telogen effluvium: Secondary | ICD-10-CM

## 2020-10-08 DIAGNOSIS — L649 Androgenic alopecia, unspecified: Secondary | ICD-10-CM

## 2020-10-08 DIAGNOSIS — L659 Nonscarring hair loss, unspecified: Secondary | ICD-10-CM

## 2020-10-08 NOTE — Progress Notes (Signed)
Follow-Up Visit   Subjective  Alexandria Fry is a 65 y.o. female who presents for the following: Follow-up (Patient here today for alopecia follow up. Patient reports that she has not noticed any change in her hair. She states she has been taking spironolactone 50 mg daily, and using rogaine topical foam. She has not been using ketoconazole shampoo due to fading her hair color. Patient is unsure if treatment is helping. ).  No side effects from the Rogaine.  She has noticed hair shedding initially after starting the Rogaine, but that has slowed down.   The following portions of the chart were reviewed this encounter and updated as appropriate:       Objective  Well appearing patient in no apparent distress; mood and affect are within normal limits.  A focused examination was performed including scalp. Relevant physical exam findings are noted in the Assessment and Plan.  Scalp Diffuse thinning of the crown and widening of the midline part with retention of the frontal hairline - Reviewed progressive nature and prognosis. Negative hair pull test, some short hairs noted at temporal hairline.  Photos compared, no significant change BP 113/74  Assessment & Plan  Alopecia Scalp  Telogen Effluvium with Androgenetic Alopecia- with stabilization, not at goal Pt has had multiple stresses on body over past couple years (Covid infection/vaccines, large weight loss, surgery) that can cause delayed hair loss (telogen effluvium)   Labs normal from 05/09/2020 (TSH, Vitamin D, Hemoglobin)    Telogen effluvium is a benign, self limited condition causing increased hair shedding usually for several months. It does not progress to baldness. It can be triggered by recent illness, recent surgery, thyroid disease, low iron stores, vitamin D deficiency, fad diets or rapid weight loss, hormonal changes such as pregnancy or birth control pills, and some medication. Usually the hair loss starts 2-3 months after  the illness or health change. Rarely, it can continue for longer than a year.  Androgenic alopecia is a chronic condition related to genetics and hormonal changes associated with menopause causing hair thinning primarily on the crown with widening of the part and temporal hairline recession.  Can use OTC Rogaine (minoxidil) 5% solution/foam as directed. Instructions given.     Continue  Spironolactone '50mg'$  take 1 po QD as tolerated dsp #30 for antiandrogen effect.   D/C ketoconazole 2% shampoo since causing hair color to fade  Continue Minoxidil 5% topical foam once nightly as tolerated to start, well-tolerated with no side effects (she has had some side effects with oral Ace inhibitors in past). Could consider increasing to twice daily. Discussed need to use it for 6 months to assess efficacy   Recommend minoxidil 5% (Rogaine for men) solution or foam to be applied to the scalp and left in. This should ideally be used twice daily for best results but it helps with hair regrowth when used at least three times per week. Rogaine initially can cause increased hair shedding for the first few weeks but this will stop with continued use. In studies, people who used minoxidil (Rogaine) for at least 6 months had thicker hair than people who did not. Minoxidil topical (Rogaine) only works as long as it continues to be used. If if it is no longer used then the hair it has been helping to regrow can fall out. Minoxidil topical (Rogaine) can cause increased facial hair growth which can usually be managed easily with a battery-operated hair trimmer. If facial hair growth is bothersome, switching to  the 2% women's version can decrease the risk of unwanted facial hair growth.   Spironolactone can cause increased urination and cause blood pressure to decrease. Please watch for signs of lightheadedness and be cautious when changing position. It can sometimes cause breast tenderness or an irregular period in  premenopausal women. It can also increase potassium. The increase in potassium usually is not a concern unless you are taking other medicines that also increase potassium, so please be sure your doctor knows all of the other medications you are taking. This medication should not be taken by pregnant women.  This medicine should also not be taken together with sulfa drugs like Bactrim (trimethoprim/sulfamethexazole).    Related Medications ketoconazole (NIZORAL) 2 % shampoo Massage into scalp and let sit several minutes before rinsing. Use 2-3x/wk.  spironolactone (ALDACTONE) 50 MG tablet TAKE 1 TABLET(50 MG) BY MOUTH DAILY  Return in about 3 months (around 01/08/2021) for alopecia follow up. I, Ruthell Rummage, CMA, am acting as scribe for Brendolyn Patty, MD.  Documentation: I have reviewed the above documentation for accuracy and completeness, and I agree with the above.  Brendolyn Patty MD

## 2020-10-08 NOTE — Patient Instructions (Addendum)
Recommend minoxidil 5% (Rogaine for men) solution or foam to be applied to the scalp and left in. This should ideally be used twice daily for best results but it helps with hair regrowth when used at least three times per week. Rogaine initially can cause increased hair shedding for the first few weeks but this will stop with continued use. In studies, people who used minoxidil (Rogaine) for at least 6 months had thicker hair than people who did not. Minoxidil topical (Rogaine) only works as long as it continues to be used. If if it is no longer used then the hair it has been helping to regrow can fall out. Minoxidil topical (Rogaine) can cause increased facial hair growth which can usually be managed easily with a battery-operated hair trimmer. If facial hair growth is bothersome, switching to the 2% women's version can decrease the risk of unwanted facial hair growth.   If you have any questions or concerns for your doctor, please call our main line at [phone] and press option 4 to reach your doctor's medical assistant. If no one answers, please leave a voicemail as directed and we will return your call as soon as possible. Messages left after 4 pm will be answered the following business day.   You may also send [place] a message via [place]. We typically respond to MyChart messages within 1-2 business days.  For prescription refills, please ask your pharmacy to contact our office. Our fax number is [phone].  If you have an urgent issue when the clinic is closed that cannot wait until the next business day, you can page your doctor at the number below.    Please note that while we do our best to be available for urgent issues outside of office hours, we are not available 24/7.   If you have an urgent issue and are unable to reach [place], you may choose to seek medical care at your doctor's office, retail clinic, urgent care center, or emergency room.  If you have a medical emergency, please  immediately call 911 or go to the emergency department.  Pager Numbers  - Dr. Nehemiah Massed: [phone]  - Dr. Laurence Ferrari: [phone]  - Dr. Nicole Kindred: [phone]  In the event of inclement weather, please call our main line at [phone] for an update on the status of any delays or closures.  Dermatology Medication Tips: Please keep the boxes that topical medications come in in order to help keep track of the instructions about where and how to use these. Pharmacies typically print the medication instructions only on the boxes and not directly on the medication tubes.   If your medication is too expensive, please contact our office at [phone] option 4 or send [place] a message through [place].   We are unable to tell what your co-pay for medications will be in advance as this is different depending on your insurance coverage. However, we may be able to find a substitute medication at lower cost or fill out paperwork to get insurance to cover a needed medication.   If a prior authorization is required to get your medication covered by your insurance company, please allow [place] 1-2 business days to complete this process.  Drug prices often vary depending on where the prescription is filled and some pharmacies may offer cheaper prices.  The website www.goodrx.com contains coupons for medications through different pharmacies. The prices here do not account for what the cost may be with help from insurance (it may be cheaper with your  insurance), but the website can give you the price if you did not use any insurance.  - You can print the associated coupon and take it with your prescription to the pharmacy.  - You may also stop by our office during regular business hours and pick up a GoodRx coupon card.  - If you need your prescription sent electronically to a different pharmacy, notify our office through [facility] or by phone at [phone] option 4.

## 2020-10-23 ENCOUNTER — Ambulatory Visit
Admission: RE | Admit: 2020-10-23 | Discharge: 2020-10-23 | Disposition: A | Discharge status: Home / Self Care | Payer: Managed Care, Other (non HMO) | Source: Ambulatory Visit | Attending: Family Medicine | Admitting: Family Medicine

## 2020-10-23 ENCOUNTER — Other Ambulatory Visit: Payer: Self-pay

## 2020-10-23 DIAGNOSIS — Z1231 Encounter for screening mammogram for malignant neoplasm of breast: Secondary | ICD-10-CM | POA: Diagnosis present

## 2020-10-25 ENCOUNTER — Other Ambulatory Visit: Payer: Self-pay | Admitting: Family Medicine

## 2020-10-25 DIAGNOSIS — R928 Other abnormal and inconclusive findings on diagnostic imaging of breast: Secondary | ICD-10-CM

## 2020-10-25 DIAGNOSIS — N6320 Unspecified lump in the left breast, unspecified quadrant: Secondary | ICD-10-CM

## 2020-10-30 ENCOUNTER — Other Ambulatory Visit: Payer: Self-pay

## 2020-10-30 ENCOUNTER — Ambulatory Visit
Admission: RE | Admit: 2020-10-30 | Discharge: 2020-10-30 | Disposition: A | Discharge status: Home / Self Care | Payer: Managed Care, Other (non HMO) | Source: Ambulatory Visit | Attending: Family Medicine | Admitting: Family Medicine

## 2020-10-30 DIAGNOSIS — R928 Other abnormal and inconclusive findings on diagnostic imaging of breast: Secondary | ICD-10-CM

## 2020-10-30 DIAGNOSIS — N6320 Unspecified lump in the left breast, unspecified quadrant: Secondary | ICD-10-CM | POA: Diagnosis present

## 2020-11-04 ENCOUNTER — Other Ambulatory Visit: Payer: Self-pay | Admitting: Dermatology

## 2020-11-04 DIAGNOSIS — L659 Nonscarring hair loss, unspecified: Secondary | ICD-10-CM

## 2020-12-30 LAB — HM DIABETES EYE EXAM

## 2021-01-06 ENCOUNTER — Ambulatory Visit (INDEPENDENT_AMBULATORY_CARE_PROVIDER_SITE_OTHER): Payer: Managed Care, Other (non HMO) | Admitting: Dermatology

## 2021-01-06 ENCOUNTER — Other Ambulatory Visit: Payer: Self-pay

## 2021-01-06 DIAGNOSIS — L659 Nonscarring hair loss, unspecified: Secondary | ICD-10-CM

## 2021-01-06 DIAGNOSIS — L650 Telogen effluvium: Secondary | ICD-10-CM | POA: Diagnosis not present

## 2021-01-06 DIAGNOSIS — L649 Androgenic alopecia, unspecified: Secondary | ICD-10-CM | POA: Diagnosis not present

## 2021-01-06 DIAGNOSIS — L820 Inflamed seborrheic keratosis: Secondary | ICD-10-CM | POA: Diagnosis not present

## 2021-01-06 DIAGNOSIS — D1801 Hemangioma of skin and subcutaneous tissue: Secondary | ICD-10-CM | POA: Diagnosis not present

## 2021-01-06 NOTE — Progress Notes (Signed)
Follow-Up Visit   Subjective  Alexandria Fry is a 65 y.o. female who presents for the following: Alopecia (Androgenetic alopecia with telogen effluvium. Improving. Patient is taking Spironolactone 50mg  daily and Minoxodil foam qd/bid. ) and growth (Vaginal area, patient noticed recently. ). She has a spot on her right thigh that gets irritated and rubbed by clothing.    The following portions of the chart were reviewed this encounter and updated as appropriate:       Review of Systems:  No other skin or systemic complaints except as noted in HPI or Assessment and Plan.  Objective  Well appearing patient in no apparent distress; mood and affect are within normal limits.  A focused examination was performed including scalp, groin. Relevant physical exam findings are noted in the Assessment and Plan.  Scalp Thinning of the crown and widening of the midline part with some short hairs, improved compared to 07/2020 photos.   R medial thigh Erythematous keratotic or waxy stuck-on papule   L labia majora Scattered small purple papules.    Assessment & Plan   Alopecia Scalp  Telogen Effluvium with Androgenetic Alopecia- with stabilization, improving but not at goal Pt has had multiple stresses on body over past couple years (Covid infection/vaccines, large weight loss, surgery) that can cause delayed hair loss (telogen effluvium)   Androgenic alopecia is a chronic condition related to genetics and/or hormonal changes associated with menopause causing hair thinning primarily on the crown with widening of the part and temporal hairline recession.  Can use OTC Rogaine (minoxidil) 5% solution/foam as directed. Instructions given.  Telogen effluvium is a benign, self limited condition causing increased hair shedding usually for several months. It does not progress to baldness, and the hair eventually grows back on its own. It can be triggered by recent illness, recent surgery, thyroid  disease, low iron stores, vitamin D deficiency, fad diets or rapid weight loss, hormonal changes such as pregnancy or birth control pills, and some medication. Usually the hair loss starts 2-3 months after the illness or health change. Rarely, it can continue for longer than a year.  Continue  Spironolactone 50mg  take 1 po QD for antiandrogen effect.  BP 188/93   Continue Minoxidil 5% topical foam once to twice daily as tolerated, well-tolerated with no side effects (she has had some side effects with oral Ace inhibitors in past).  Spironolactone can cause increased urination and cause blood pressure to decrease. Please watch for signs of lightheadedness and be cautious when changing position. It can sometimes cause breast tenderness or an irregular period in premenopausal women. It can also increase potassium. The increase in potassium usually is not a concern unless you are taking other medicines that also increase potassium, so please be sure your doctor knows all of the other medications you are taking. This medication should not be taken by pregnant women.  This medicine should also not be taken together with sulfa drugs like Bactrim (trimethoprim/sulfamethexazole).      Related Medications ketoconazole (NIZORAL) 2 % shampoo Massage into scalp and let sit several minutes before rinsing. Use 2-3x/wk.  spironolactone (ALDACTONE) 50 MG tablet TAKE 1 TABLET(50 MG) BY MOUTH DAILY  Inflamed seborrheic keratosis R medial thigh  Destruction of lesion - R medial thigh  Destruction method: cryotherapy   Informed consent: discussed and consent obtained   Lesion destroyed using liquid nitrogen: Yes   Region frozen until ice ball extended beyond lesion: Yes   Outcome: patient tolerated procedure well with  no complications   Post-procedure details: wound care instructions given   Additional details:  Prior to procedure, discussed risks of blister formation, small wound, skin dyspigmentation, or  rare scar following cryotherapy. Recommend Vaseline ointment to treated areas while healing.   Hemangioma of skin L labia majora  Reassured Benign, observe.    Return in about 6 months (around 07/06/2021) for alopecia.  IJamesetta Orleans, CMA, am acting as scribe for Brendolyn Patty, MD . Documentation: I have reviewed the above documentation for accuracy and completeness, and I agree with the above.  Brendolyn Patty MD

## 2021-01-06 NOTE — Patient Instructions (Addendum)

## 2021-01-08 ENCOUNTER — Encounter: Payer: Self-pay | Admitting: Family Medicine

## 2021-02-05 ENCOUNTER — Other Ambulatory Visit: Payer: Self-pay | Admitting: Dermatology

## 2021-02-05 DIAGNOSIS — L659 Nonscarring hair loss, unspecified: Secondary | ICD-10-CM

## 2021-05-05 ENCOUNTER — Ambulatory Visit (INDEPENDENT_AMBULATORY_CARE_PROVIDER_SITE_OTHER): Payer: Medicare (Managed Care)

## 2021-05-05 ENCOUNTER — Other Ambulatory Visit: Payer: Self-pay

## 2021-05-05 ENCOUNTER — Encounter: Payer: Self-pay | Admitting: Emergency Medicine

## 2021-05-05 ENCOUNTER — Encounter: Payer: Self-pay | Admitting: Family Medicine

## 2021-05-05 ENCOUNTER — Ambulatory Visit
Admission: EM | Admit: 2021-05-05 | Discharge: 2021-05-05 | Disposition: A | Discharge status: Home / Self Care | Payer: Managed Care, Other (non HMO) | Attending: Emergency Medicine | Admitting: Emergency Medicine

## 2021-05-05 DIAGNOSIS — R059 Cough, unspecified: Secondary | ICD-10-CM

## 2021-05-05 DIAGNOSIS — J209 Acute bronchitis, unspecified: Secondary | ICD-10-CM | POA: Diagnosis not present

## 2021-05-05 DIAGNOSIS — I10 Essential (primary) hypertension: Secondary | ICD-10-CM

## 2021-05-05 MED ORDER — AMOXICILLIN 500 MG PO CAPS: 500.0000 mg | ORAL_CAPSULE | Freq: Three times a day (TID) | ORAL | 0 refills | Status: DC

## 2021-05-05 MED ORDER — AMOXICILLIN 875 MG PO TABS: 875.0000 mg | ORAL_TABLET | Freq: Two times a day (BID) | ORAL | 0 refills | Status: DC

## 2021-05-05 NOTE — ED Provider Notes (Signed)
?[facility] ? ? ? ?CSN: [id number] ?Arrival date & time: 05/05/21  1025 ? ? ?  ? ?History   ?Chief Complaint ?Chief Complaint  ?Patient presents with  ? Cough  ? Nasal Congestion  ? ? ?HPI ?Alexandria Fry is a 66 y.o. female.  Patient presents with 5-6 day history of sinus congestion, postnasal drip, runny nose, productive cough, shortness of breath.  No fever, chills, vomiting, diarrhea, or other symptoms.  Treatment at home with Mucinex, Afrin nasal spray.  Her medical history includes pneumonia, hypertension, GERD, allergic rhinitis. ? ?The history is provided by the patient and medical records.  ? ?Past Medical History:  ?Diagnosis Date  ? Allergic rhinitis   ? Diabetes mellitus without complication ([place])   ? Difficult intubation   ? Fatty liver   ? GERD (gastroesophageal reflux disease)   ? Hypertension   ? Irregular menses   ? with neg endometrial biopsy 2013  ? Menorrhagia   ? Murmur, cardiac   ? Pneumonia   ? 2014  ? PONV (postoperative nausea and vomiting)   ? ? ?Patient Active Problem List  ? Diagnosis Date Noted  ? 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 Multinodular goiter 05/27/2020  ? History of UTI 05/19/2020  ? Hammer toe of left foot 06/22/2019  ? Allergy history, drug 04/10/2018  ? Leg pain 04/10/2018  ? Status post laparotomy 11/11/2015  ? Hyperglycemia 03/09/2015  ? Non-toxic multinodular goiter 03/21/2014  ? Advance care planning 02/07/2014  ? Hyperlipidemia 02/07/2014  ? Routine general medical examination at a health care facility 01/31/2012  ? Vitamin D deficiency 10/09/2008  ? 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 Overweight 01/18/2007  ? Essential hypertension 01/18/2007  ? ALLERGIC RHINITIS 11/29/2006  ? FIBROCYSTIC BREAST DISEASE 11/29/2006  ? ? ?Past Surgical History:  ?Procedure Laterality Date  ? ABDOMINAL HYSTERECTOMY    ? CERVICAL CONE BIOPSY    ? COLONOSCOPY WITH PROPOFOL N/A 01/18/2018  ? Procedure: COLONOSCOPY WITH PROPOFOL;  Surgeon: Lollie Sails, MD;  Location: [facility] ENDOSCOPY;   Service: Endoscopy;  Laterality: N/A;  ? CYSTOSCOPY  11/11/2015  ? Procedure: CYSTOSCOPY;  Surgeon: Honor Loh Ward, MD;  Location: [facility] ORS;  Service: Gynecology;;  ? DILATATION & CURETTAGE/HYSTEROSCOPY WITH MYOSURE N/A 03/21/2015  ? Procedure: DILATATION & CURETTAGE/HYSTEROSCOPY, POLYPECTOMY;  Surgeon: Honor Loh Ward, MD;  Location: [facility] ORS;  Service: Gynecology;  Laterality: N/A;  ? [place] OF UTERUS  1981  ? [facility]   ? DILATION AND CURETTAGE OF UTERUS  06/25/2009  ? normal (Dr. Laurey Morale)  ? LAPAROSCOPIC BILATERAL SALPINGECTOMY Bilateral 11/11/2015  ? Procedure: LAPAROSCOPIC BILATERAL SALPINGECTOMY;  Surgeon: Honor Loh Ward, MD;  Location: [facility] ORS;  Service: Gynecology;  Laterality: Bilateral;  ? LAPAROSCOPIC SUPRACERVICAL HYSTERECTOMY  11/11/2015  ? Procedure: LAPAROSCOPIC SUPRACERVICAL HYSTERECTOMY CONVERTED TO OPEN;  Surgeon: Honor Loh Ward, MD;  Location: [facility] ORS;  Service: Gynecology;;  ? LAPAROTOMY  11/11/2015  ? Procedure: LAPAROTOMY;  Surgeon: Honor Loh Ward, MD;  Location: [facility] ORS;  Service: Gynecology;;  ? VAGINAL DELIVERY    ? NSVD x 2  ? ? ?OB History   ?No obstetric history on file. ?  ? ? ? ?Home Medications   ? ?Prior to Admission medications   ?Medication Sig Start Date End Date Taking? Authorizing Provider  ?amoxicillin (AMOXIL) 500 MG capsule Take 1 capsule (500 mg total) by mouth 3 (three) times daily for 10 days. 05/05/21 05/15/21 Yes Sharion Balloon, NP  ?ALREX 0.2 % SUSP Apply 1 drop  to eye 2 (two) times daily.  08/10/19   [provider]  ?hydrocortisone 2.5 % cream Apply 1 application topically 2 (two) times daily.  01/18/18   [provider]  ?ketoconazole (NIZORAL) 2 % shampoo Massage into scalp and let sit several minutes before rinsing. Use 2-3x/wk. 08/06/20   Brendolyn Patty, MD  ?Multiple Vitamin (MULTIVITAMIN) tablet Take 1 tablet by mouth daily.    [provider]  ?pramoxine-hydrocortisone cream Apply topically 3 (three) times daily as needed.  05/16/20   Tonia Ghent, MD  ?spironolactone (ALDACTONE) 25 MG tablet Take 1 tablet (25 mg total) by mouth daily. 07/22/20   Tonia Ghent, MD  ?spironolactone (ALDACTONE) 50 MG tablet TAKE 1 TABLET(50 MG) BY MOUTH DAILY 02/05/21   Brendolyn Patty, MD  ?Vitamin D-Vitamin K (VITAMIN K2-VITAMIN D3 PO) Take by mouth.    [provider]  ? ? ?Family History ?Family History  ?Problem Relation Age of Onset  ? Cancer Mother 49  ?     breast with a mastectomy that was hormone receptor  and rectal cancer at age 89 with a protectomy, ileostomy and colectomy, multiple polyps  ? Diabetes Mother   ? Hypertension Mother   ? Colon cancer Mother   ? Breast cancer Mother 87  ? Osteoporosis Mother   ? Cancer Father   ?     pancreatic  ? Colon polyps Father   ? Alcohol abuse Father   ? Heart disease Father   ? Early death Sister 44  ?     suicide  ? Thyroid disease Neg Hx   ? ? ?Social History ?Social History  ? ?Tobacco Use  ? Smoking status: Former  ?  Packs/day: 1.00  ?  Years: 10.00  ?  Pack years: 10.00  ?  Types: Cigarettes  ?  Quit date: 01/20/1982  ?  Years since quitting: 39.3  ? Smokeless tobacco: Never  ? Tobacco comments:  ?  quit for 28 years  ?Vaping Use  ? Vaping Use: Never used  ?Substance Use Topics  ? Alcohol use: Not Currently  ? Drug use: No  ? ? ? ?Allergies   ?Amlodipine, Azithromycin, Clavulanic acid, Hydrochlorothiazide, Other, Sulfa antibiotics, Ace inhibitors, Angiotensin receptor blockers, Clonidine derivatives, Diphenhydramine hcl, Fentanyl, Hydrochlorothiazide w-triamterene, Hydrocodone, Metformin and related, Metoprolol, Nitrofurantoin, Oxycodone, Tetanus toxoids, Cefuroxime axetil, and Levofloxacin in d5w ? ? ?Review of Systems ?Review of Systems  ?Constitutional:  Negative for chills and fever.  ?HENT:  Positive for congestion, postnasal drip, rhinorrhea and sinus pressure. Negative for ear pain and sore throat.   ?Respiratory:  Positive for cough and shortness of breath.   ?Cardiovascular:   Negative for chest pain and palpitations.  ?Gastrointestinal:  Negative for diarrhea and vomiting.  ?Skin:  Negative for color change and rash.  ?All other systems reviewed and are negative. ? ? ?Physical Exam ?Triage Vital Signs ?ED Triage Vitals  ?Enc Vitals Group  ?   BP   ?   Pulse   ?   Resp   ?   Temp   ?   Temp src   ?   SpO2   ?   Weight   ?   Height   ?   Head Circumference   ?   Peak Flow   ?   Pain Score   ?   Pain Loc   ?   Pain Edu?   ?   Excl. in [place]?   ? ?No data found. ? ?  Updated Vital Signs ?BP (!) 153/87   Pulse 100   Temp 98 ?F (36.7 ?C)   Resp 18   LMP  (LMP Unknown)   SpO2 97%  ? ?Visual Acuity ?Right Eye Distance:   ?Left Eye Distance:   ?Bilateral Distance:   ? ?Right Eye Near:   ?Left Eye Near:    ?Bilateral Near:    ? ?Physical Exam ?Vitals and nursing note reviewed.  ?Constitutional:   ?   General: She is not in acute distress. ?   Appearance: She is well-developed. She is not ill-appearing.  ?HENT:  ?   Right Ear: Tympanic membrane normal.  ?   Left Ear: Tympanic membrane normal.  ?   Nose: Congestion and rhinorrhea present.  ?   Mouth/Throat:  ?   Mouth: Mucous membranes are moist.  ?   Pharynx: Oropharynx is clear.  ?Cardiovascular:  ?   Rate and Rhythm: Normal rate and regular rhythm.  ?   Heart sounds: No murmur heard. ?Pulmonary:  ?   Effort: Pulmonary effort is normal. No respiratory distress.  ?   Breath sounds: Rhonchi present.  ?   Comments: Scattered rhonchi, L>R side. ?Musculoskeletal:  ?   Cervical back: Neck supple.  ?Skin: ?   General: Skin is warm and dry.  ?Neurological:  ?   Mental Status: She is alert.  ?Psychiatric:     ?   Mood and Affect: Mood normal.     ?   Behavior: Behavior normal.  ? ? ? ?UC Treatments / Results  ?Labs ?(all labs ordered are listed, but only abnormal results are displayed) ?Labs Reviewed - No data to display ? ?EKG ? ? ?Radiology ?DG Chest 2 View ? ?Result Date: 05/05/2021 ?CLINICAL DATA:  66 year old female with productive cough, shortness of  breath. Runny nose and chest congestion. EXAM: CHEST - 2 VIEW COMPARISON:  Chest radiographs 03/18/2015 and earlier. FINDINGS: Ste larger lung volumes compared to 2017, upper limits of normal now. Normal cardiac

## 2021-05-05 NOTE — Discharge Instructions (Addendum)
Take the amoxicillin as directed.  Follow up with your primary care provider if your symptoms are not improving.   ? ?Your blood pressure is elevated today at 184/67; repeat 153/87.  Please have this rechecked by your primary care provider in 2-4 weeks.     ? ?

## 2021-05-05 NOTE — ED Triage Notes (Signed)
Pt presents with cough, runny nose and chest congestion x 5 days.  ?

## 2021-05-06 ENCOUNTER — Encounter: Payer: Self-pay | Admitting: *Deleted

## 2021-05-06 ENCOUNTER — Emergency Department: Payer: Medicare (Managed Care)

## 2021-05-06 ENCOUNTER — Other Ambulatory Visit: Payer: Self-pay

## 2021-05-06 ENCOUNTER — Observation Stay
Admission: EM | Admit: 2021-05-06 | Discharge: 2021-05-07 | Disposition: A | Discharge status: Home / Self Care | Payer: Medicare (Managed Care) | Attending: Internal Medicine | Admitting: Internal Medicine

## 2021-05-06 DIAGNOSIS — J40 Bronchitis, not specified as acute or chronic: Secondary | ICD-10-CM | POA: Insufficient documentation

## 2021-05-06 DIAGNOSIS — Z20822 Contact with and (suspected) exposure to covid-19: Secondary | ICD-10-CM | POA: Diagnosis not present

## 2021-05-06 DIAGNOSIS — T360X5A Adverse effect of penicillins, initial encounter: Secondary | ICD-10-CM | POA: Insufficient documentation

## 2021-05-06 DIAGNOSIS — N179 Acute kidney failure, unspecified: Secondary | ICD-10-CM | POA: Diagnosis present

## 2021-05-06 DIAGNOSIS — Z79899 Other long term (current) drug therapy: Secondary | ICD-10-CM | POA: Diagnosis not present

## 2021-05-06 DIAGNOSIS — R059 Cough, unspecified: Secondary | ICD-10-CM | POA: Diagnosis present

## 2021-05-06 DIAGNOSIS — R051 Acute cough: Secondary | ICD-10-CM

## 2021-05-06 DIAGNOSIS — R0602 Shortness of breath: Secondary | ICD-10-CM | POA: Diagnosis not present

## 2021-05-06 DIAGNOSIS — I10 Essential (primary) hypertension: Secondary | ICD-10-CM | POA: Insufficient documentation

## 2021-05-06 DIAGNOSIS — J9601 Acute respiratory failure with hypoxia: Principal | ICD-10-CM | POA: Insufficient documentation

## 2021-05-06 DIAGNOSIS — E119 Type 2 diabetes mellitus without complications: Secondary | ICD-10-CM | POA: Diagnosis not present

## 2021-05-06 DIAGNOSIS — J9691 Respiratory failure, unspecified with hypoxia: Secondary | ICD-10-CM | POA: Diagnosis present

## 2021-05-06 DIAGNOSIS — J189 Pneumonia, unspecified organism: Secondary | ICD-10-CM | POA: Diagnosis not present

## 2021-05-06 DIAGNOSIS — E042 Nontoxic multinodular goiter: Secondary | ICD-10-CM | POA: Insufficient documentation

## 2021-05-06 DIAGNOSIS — Z87891 Personal history of nicotine dependence: Secondary | ICD-10-CM | POA: Insufficient documentation

## 2021-05-06 DIAGNOSIS — T7840XA Allergy, unspecified, initial encounter: Secondary | ICD-10-CM

## 2021-05-06 LAB — CBC
HCT: 48.1 % — ABNORMAL HIGH (ref 36.0–46.0)
HCT: 47.9 % — ABNORMAL HIGH (ref 36.0–46.0)
Hemoglobin: 15.8 g/dL — ABNORMAL HIGH (ref 12.0–15.0)
Hemoglobin: 15.9 g/dL — ABNORMAL HIGH (ref 12.0–15.0)
MCH: 31.5 pg (ref 26.0–34.0)
MCH: 31.6 pg (ref 26.0–34.0)
MCHC: 32.8 g/dL (ref 30.0–36.0)
MCHC: 33.2 g/dL (ref 30.0–36.0)
MCV: 96 fL (ref 80.0–100.0)
MCV: 95.2 fL (ref 80.0–100.0)
Platelets: 206 10*3/uL (ref 150–400)
Platelets: 204 10*3/uL (ref 150–400)
RBC: 5.01 MIL/uL (ref 3.87–5.11)
RBC: 5.03 MIL/uL (ref 3.87–5.11)
RDW: 11.6 % (ref 11.5–15.5)
RDW: 11.6 % (ref 11.5–15.5)
WBC: 7.3 10*3/uL (ref 4.0–10.5)
WBC: 10.3 10*3/uL (ref 4.0–10.5)
nRBC: 0 % (ref 0.0–0.2)
nRBC: 0 % (ref 0.0–0.2)

## 2021-05-06 LAB — RESP PANEL BY RT-PCR (FLU A&B, COVID) ARPGX2
Influenza A by PCR: NEGATIVE
Influenza B by PCR: NEGATIVE
SARS Coronavirus 2 by RT PCR: NEGATIVE

## 2021-05-06 LAB — BRAIN NATRIURETIC PEPTIDE: B Natriuretic Peptide: 12.8 pg/mL (ref 0.0–100.0)

## 2021-05-06 LAB — T4, FREE: Free T4: 0.98 ng/dL (ref 0.61–1.12)

## 2021-05-06 LAB — TROPONIN I (HIGH SENSITIVITY)
Troponin I (High Sensitivity): 7 ng/L (ref <18)
Troponin I (High Sensitivity): 7 ng/L (ref <18)

## 2021-05-06 LAB — BASIC METABOLIC PANEL
Anion gap: 7 (ref 5–15)
BUN: 19 mg/dL (ref 8–23)
CO2: 29 mmol/L (ref 22–32)
Calcium: 10.1 mg/dL (ref 8.9–10.3)
Chloride: 103 mmol/L (ref 98–111)
Creatinine, Ser: 1.19 mg/dL — ABNORMAL HIGH (ref 0.44–1.00)
GFR, Estimated: 51 mL/min — ABNORMAL LOW (ref >60)
Glucose, Bld: 162 mg/dL — ABNORMAL HIGH (ref 70–99)
Potassium: 4 mmol/L (ref 3.5–5.1)
Sodium: 139 mmol/L (ref 135–145)

## 2021-05-06 LAB — CREATININE, SERUM
Creatinine, Ser: 1.21 mg/dL — ABNORMAL HIGH (ref 0.44–1.00)
GFR, Estimated: 50 mL/min — ABNORMAL LOW (ref >60)

## 2021-05-06 LAB — TSH: TSH: 0.625 u[IU]/mL (ref 0.350–4.500)

## 2021-05-06 MED ORDER — METHYLPREDNISOLONE SODIUM SUCC 125 MG IJ SOLR
125.0000 mg | Freq: Once | INTRAMUSCULAR | Status: AC
  Administered 2021-05-06: 125 mg via INTRAVENOUS
  Filled 2021-05-06: qty 2

## 2021-05-06 MED ORDER — SPIRONOLACTONE 25 MG PO TABS
50.0000 mg | ORAL_TABLET | Freq: Every day | ORAL | Status: DC
  Administered 2021-05-07: 50 mg via ORAL
  Filled 2021-05-06: qty 2

## 2021-05-06 MED ORDER — SODIUM CHLORIDE 0.9% FLUSH
3.0000 mL | Freq: Two times a day (BID) | INTRAVENOUS | Status: DC
  Administered 2021-05-06 – 2021-05-07 (×2): 3 mL via INTRAVENOUS

## 2021-05-06 MED ORDER — CLINDAMYCIN PHOSPHATE 300 MG/50ML IV SOLN
300.0000 mg | Freq: Three times a day (TID) | INTRAVENOUS | Status: DC
  Administered 2021-05-06 – 2021-05-07 (×2): 300 mg via INTRAVENOUS
  Filled 2021-05-06 (×4): qty 50

## 2021-05-06 MED ORDER — SODIUM CHLORIDE 0.9% FLUSH: 3.0000 mL | INTRAVENOUS | Status: DC | PRN

## 2021-05-06 MED ORDER — FAMOTIDINE IN NACL 20-0.9 MG/50ML-% IV SOLN
20.0000 mg | Freq: Once | INTRAVENOUS | Status: AC
  Administered 2021-05-06: 20 mg via INTRAVENOUS
  Filled 2021-05-06: qty 50

## 2021-05-06 MED ORDER — IOHEXOL 350 MG/ML SOLN
75.0000 mL | Freq: Once | INTRAVENOUS | Status: AC | PRN
  Administered 2021-05-06: 75 mL via INTRAVENOUS

## 2021-05-06 MED ORDER — ACETAMINOPHEN 650 MG RE SUPP: 650.0000 mg | Freq: Four times a day (QID) | RECTAL | Status: DC | PRN

## 2021-05-06 MED ORDER — HEPARIN SODIUM (PORCINE) 5000 UNIT/ML IJ SOLN
5000.0000 [IU] | Freq: Three times a day (TID) | INTRAMUSCULAR | Status: DC
  Administered 2021-05-06: 5000 [IU] via SUBCUTANEOUS
  Filled 2021-05-06 (×3): qty 1

## 2021-05-06 MED ORDER — METHYLPREDNISOLONE SODIUM SUCC 40 MG IJ SOLR
40.0000 mg | Freq: Two times a day (BID) | INTRAMUSCULAR | Status: DC
Start: 2021-05-06 — End: 2021-05-06

## 2021-05-06 MED ORDER — METHYLPREDNISOLONE SODIUM SUCC 125 MG IJ SOLR
80.0000 mg | INTRAMUSCULAR | Status: DC
  Administered 2021-05-07: 80 mg via INTRAVENOUS
  Filled 2021-05-06: qty 2

## 2021-05-06 MED ORDER — ACETAMINOPHEN 325 MG PO TABS: 650.0000 mg | ORAL_TABLET | Freq: Four times a day (QID) | ORAL | Status: DC | PRN

## 2021-05-06 MED ORDER — SODIUM CHLORIDE 0.9 % IV SOLN: 250.0000 mL | INTRAVENOUS | Status: DC | PRN

## 2021-05-06 MED ORDER — IPRATROPIUM-ALBUTEROL 0.5-2.5 (3) MG/3ML IN SOLN
3.0000 mL | Freq: Once | RESPIRATORY_TRACT | Status: AC
  Administered 2021-05-06: 3 mL via RESPIRATORY_TRACT
  Filled 2021-05-06: qty 3

## 2021-05-06 MED ORDER — ALBUTEROL SULFATE (2.5 MG/3ML) 0.083% IN NEBU
2.5000 mg | INHALATION_SOLUTION | Freq: Four times a day (QID) | RESPIRATORY_TRACT | Status: DC
  Administered 2021-05-07 (×2): 2.5 mg via RESPIRATORY_TRACT
  Filled 2021-05-06 (×2): qty 3

## 2021-05-06 MED ORDER — HYDRALAZINE HCL 20 MG/ML IJ SOLN: 5.0000 mg | Freq: Four times a day (QID) | INTRAMUSCULAR | Status: DC | PRN

## 2021-05-06 MED ORDER — SODIUM CHLORIDE 0.9 % IV SOLN
100.0000 mg | Freq: Two times a day (BID) | INTRAVENOUS | Status: DC
  Administered 2021-05-06 – 2021-05-07 (×2): 100 mg via INTRAVENOUS
  Filled 2021-05-06 (×3): qty 100

## 2021-05-06 NOTE — H&P (Signed)
?History and Physical  ? ? ?Patient: Alexandria Fry VPX:[id number] DOB: 05-15-1955 ?DOA: 05/06/2021 ?DOS: the patient was seen and examined on 05/06/2021 ?PCP: Tonia Ghent, MD  ?Patient coming from: Home ? ?Chief Complaint:  ?Chief Complaint  ?Patient presents with  ? Cough  ? Shortness of Breath  ? ?HPI: Alexandria Fry is a 66 y.o. female with medical history significant of dm II diet managed, htn, gerd, pna,presenting with SOB and hypoxia requiring 2 L [place] for past few days. Due to her multiple allergies . Pt is alert and gives history. ?Does not report any headache and ros is negative. Requested edmd to get cta which showed bl pneumonia.  ? ? ?Review of Systems: Review of Systems  ?Neurological:  Positive for headaches.  ?All other systems reviewed and are negative. ? ?Past Medical History:  ?Diagnosis Date  ? Allergic rhinitis   ? Diabetes mellitus without complication ([place])   ? Difficult intubation   ? Fatty liver   ? GERD (gastroesophageal reflux disease)   ? Hypertension   ? Irregular menses   ? with neg endometrial biopsy 2013  ? Menorrhagia   ? Murmur, cardiac   ? Pneumonia   ? 2014  ? PONV (postoperative nausea and vomiting)   ? ?Past Surgical History:  ?Procedure Laterality Date  ? ABDOMINAL HYSTERECTOMY    ? CERVICAL CONE BIOPSY    ? COLONOSCOPY WITH PROPOFOL N/A 01/18/2018  ? Procedure: COLONOSCOPY WITH PROPOFOL;  Surgeon: Lollie Sails, MD;  Location: [facility] ENDOSCOPY;  Service: Endoscopy;  Laterality: N/A;  ? CYSTOSCOPY  11/11/2015  ? Procedure: CYSTOSCOPY;  Surgeon: Honor Loh Ward, MD;  Location: [facility] ORS;  Service: Gynecology;;  ? DILATATION & CURETTAGE/HYSTEROSCOPY WITH MYOSURE N/A 03/21/2015  ? Procedure: DILATATION & CURETTAGE/HYSTEROSCOPY, POLYPECTOMY;  Surgeon: Honor Loh Ward, MD;  Location: [facility] ORS;  Service: Gynecology;  Laterality: N/A;  ? [place] OF UTERUS  1981  ? [facility]   ? DILATION AND CURETTAGE OF UTERUS  06/25/2009  ? normal (Dr. Laurey Morale)  ? LAPAROSCOPIC  BILATERAL SALPINGECTOMY Bilateral 11/11/2015  ? Procedure: LAPAROSCOPIC BILATERAL SALPINGECTOMY;  Surgeon: Honor Loh Ward, MD;  Location: [facility] ORS;  Service: Gynecology;  Laterality: Bilateral;  ? LAPAROSCOPIC SUPRACERVICAL HYSTERECTOMY  11/11/2015  ? Procedure: LAPAROSCOPIC SUPRACERVICAL HYSTERECTOMY CONVERTED TO OPEN;  Surgeon: Honor Loh Ward, MD;  Location: [facility] ORS;  Service: Gynecology;;  ? LAPAROTOMY  11/11/2015  ? Procedure: LAPAROTOMY;  Surgeon: Honor Loh Ward, MD;  Location: [facility] ORS;  Service: Gynecology;;  ? VAGINAL DELIVERY    ? NSVD x 2  ? ?Social History:  reports that she quit smoking about 39 years ago. Her smoking use included cigarettes. She has a 10.00 pack-year smoking history. She has never used smokeless tobacco. She reports that she does not currently use alcohol. She reports that she does not use drugs. ? ?Allergies  ?Allergen Reactions  ? Amlodipine Other (See Comments) and Swelling  ?  edema  ? Azithromycin Hives  ? Clavulanic Acid Swelling  ?  It is noted that the patient tolerates Augmentin 500/125 mg, but not 875/125 mg. Reaction is dose dependent on amoxicillin.  ? Hydrochlorothiazide Other (See Comments) and Hives  ? Other Swelling  ?  "made skin peel off" ?Feed and hands turned red  ? Sulfa Antibiotics Other (See Comments)  ?  Tongue swells ?Augmenting,ampicillin,bactrium ds  ? Ace Inhibitors Other (See Comments)  ?  Would avoid.  She has h/o lip swelling with other meds.  ?  Tachy  ? Angiotensin Receptor Blockers Other (See Comments)  ?  Would avoid.  She has h/o lip swelling with other meds.   ? Clonidine Derivatives Other (See Comments)  ?  bradycardia  ? Diphenhydramine Hcl   ?  Benadryl cream causes local skin irritation.  Would avoid med.  She can tolerate zyrtec.   ? Fentanyl Other (See Comments)  ?  She felt diffusely awful while on med  ? Hydrochlorothiazide W-Triamterene   ?  Possible hives and GI upset  ? Hydrocodone   ?  intolerant  ? Metformin And Related   ?  rash  ?  Metoprolol   ?  Bradycardia.  ? Nitrofurantoin Other (See Comments)  ?  rash  ? Oxycodone Other (See Comments)  ?  intolerant  ? Tetanus Toxoids   ?  Local swelling, can consider split dosing 2 weeks apart  ? Cefuroxime Axetil Rash  ?  Rash  ? Levofloxacin In D5w Itching  ?  Severe itching and flushing.  ? ? ?Family History  ?Problem Relation Age of Onset  ? Cancer Mother 23  ?     breast with a mastectomy that was hormone receptor  and rectal cancer at age 46 with a protectomy, ileostomy and colectomy, multiple polyps  ? Diabetes Mother   ? Hypertension Mother   ? Colon cancer Mother   ? Breast cancer Mother 43  ? Osteoporosis Mother   ? Cancer Father   ?     pancreatic  ? Colon polyps Father   ? Alcohol abuse Father   ? Heart disease Father   ? Early death Sister 41  ?     suicide  ? Thyroid disease Neg Hx   ? ? ?Prior to Admission medications   ?Medication Sig Start Date End Date Taking? Authorizing Provider  ?ALREX 0.2 % SUSP Apply 1 drop to eye 2 (two) times daily.  08/10/19   [provider]  ?amoxicillin (AMOXIL) 500 MG capsule Take 1 capsule (500 mg total) by mouth 3 (three) times daily for 10 days. 05/05/21 05/15/21  Sharion Balloon, NP  ?amoxicillin (AMOXIL) 875 MG tablet Take 875 mg by mouth 2 (two) times daily. 05/05/21   [provider]  ?hydrocortisone 2.5 % cream Apply 1 application topically 2 (two) times daily.  01/18/18   [provider]  ?ketoconazole (NIZORAL) 2 % shampoo Massage into scalp and let sit several minutes before rinsing. Use 2-3x/wk. 08/06/20   Brendolyn Patty, MD  ?Multiple Vitamin (MULTIVITAMIN) tablet Take 1 tablet by mouth daily.    [provider]  ?pramoxine-hydrocortisone cream Apply topically 3 (three) times daily as needed. 05/16/20   Tonia Ghent, MD  ?spironolactone (ALDACTONE) 25 MG tablet Take 1 tablet (25 mg total) by mouth daily. 07/22/20   Tonia Ghent, MD  ?spironolactone (ALDACTONE) 50 MG tablet TAKE 1 TABLET(50 MG) BY MOUTH DAILY  02/05/21   Brendolyn Patty, MD  ?Vitamin D-Vitamin K (VITAMIN K2-VITAMIN D3 PO) Take by mouth.    [provider]  ? ? ?Physical Exam: ?Vitals:  ? 05/06/21 1800 05/06/21 1830 05/06/21 1900 05/06/21 2123  ?BP: (!) 146/105 106/65 (!) 121/58 (!) 160/67  ?Pulse: 98 97 87 93  ?Resp: '17 19 18 16  '$ ?Temp:    98.4 ?F (36.9 ?C)  ?TempSrc:    Oral  ?SpO2: 97% 96% 98% 95%  ?Weight:      ?Height:      ? ?Physical Exam ?Vitals and nursing note  reviewed.  ?Constitutional:   ?   General: She is not in acute distress. ?   Appearance: Normal appearance. She is not ill-appearing, toxic-appearing or diaphoretic.  ?   Interventions: Nasal cannula in place.  ?HENT:  ?   Head: Normocephalic and atraumatic.  ?   Right Ear: Hearing and external ear normal.  ?   Left Ear: Hearing and external ear normal.  ?   Nose: Nose normal. No nasal deformity.  ?   Mouth/Throat:  ?   Lips: Pink.  ?   Mouth: Mucous membranes are moist.  ?   Tongue: No lesions.  ?   Pharynx: Oropharynx is clear.  ?Eyes:  ?   Extraocular Movements: Extraocular movements intact.  ?   Pupils: Pupils are equal, round, and reactive to light.  ?Neck:  ?   Vascular: No carotid bruit.  ?Cardiovascular:  ?   Rate and Rhythm: Normal rate and regular rhythm.  ?   Pulses: Normal pulses.  ?   Heart sounds: Normal heart sounds.  ?Pulmonary:  ?   Effort: Pulmonary effort is normal.  ?   Breath sounds: Rhonchi present.  ?Chest:  ?   Chest wall: No tenderness.  ?Abdominal:  ?   General: Bowel sounds are normal. There is no distension.  ?   Palpations: Abdomen is soft. There is no mass.  ?   Tenderness: There is no abdominal tenderness. There is no guarding.  ?   Hernia: No hernia is present.  ?Musculoskeletal:  ?   Right lower leg: No edema.  ?   Left lower leg: No edema.  ?Skin: ?   General: Skin is warm.  ?Neurological:  ?   General: No focal deficit present.  ?   Mental Status: She is alert and oriented to person, place, and time.  ?   Cranial Nerves: Cranial nerves 2-12 are  intact.  ?   Motor: Motor function is intact.  ?Psychiatric:     ?   Attention and Perception: Attention normal.     ?   Mood and Affect: Mood normal.     ?   Speech: Speech normal.     ?   Behavior: Behavior normal. Alvera Novel

## 2021-05-06 NOTE — ED Triage Notes (Addendum)
Pt to triage via wheelchair.  Pt has a cough.  Dx with bronchitis yesterday at urgent care.  Pt on amoxil.  Pt also reports sob.  No chest pain.   No fever.  Pt alert  speech clear.  ?

## 2021-05-06 NOTE — Assessment & Plan Note (Addendum)
This most likely due to atypical pneumonia and acute bronchitis.  Condition much improved, patient off oxygen without any short of breath. ?

## 2021-05-06 NOTE — Telephone Encounter (Signed)
I spoke with pt; pt was seen at [facility] on 05/05/21 and was given abx. Pt has been taking OTC guaifenesin also. Pt has symptoms that started on 05/01/21;pt has dry cough, tight in chest and SOB upon exertion. Pulse ox 84-86 when pt up and moving around. Pt has wheezing and rattling sounds in chest, burning S/T, head congestion and runny nose with slight H/A at forehead. No fever or vomiting and diarrhea. Pt said she was not tested at [facility] on 05/05/21. I spoke with Dr Damita Dunnings who said pt needs to go to ED for eval. Pt said she will go and she is going to contact son to come an take her to [facility] ED. Pt declined 911. If pt cannot reach her son pt plans to drive. I advised against pt driving but she said she would not if she could reach her son. I spoke with [facility] first nurse and she said if pt is able to drive can go to [place] and then someone will take pt to ED in w/c. I spoke with pt again and her son is on his way to take pt to ED. Sending note to Dr Damita Dunnings and Janett Billow CMA. ?

## 2021-05-06 NOTE — Assessment & Plan Note (Addendum)
Pt states she is no longer a diabetic and her last a1c was 5.1.  Follow-up with PCP as outpatient ? ? ?

## 2021-05-06 NOTE — ED Notes (Signed)
Patient ambulated around room with RN. O2 dropped into mid 80's on RA- patient noted to be SOB upon exertion. Patient sat back in bed and O2 back to 96% on RA. Cheri Fowler, MD aware. ?

## 2021-05-06 NOTE — Assessment & Plan Note (Addendum)
Continue as needed albuterol. ? ?

## 2021-05-06 NOTE — Assessment & Plan Note (Addendum)
Follow-up with PCP as outpatient. ? ?

## 2021-05-06 NOTE — Assessment & Plan Note (Addendum)
Resume home medicines. 

## 2021-05-06 NOTE — ED Notes (Signed)
Patient placed on 1L [place] per Cheri Fowler, MD.  ?

## 2021-05-06 NOTE — ED Provider Notes (Signed)
Patient ? ?[facility] ?Provider Note ? ? Event Date/Time  ? First MD Initiated Contact with Patient 05/06/21 1528   ?  (approximate) ?History  ?Cough and Shortness of Breath ? ?HPI ?Alexandria Fry is a 66 y.o. female with a past medical history of recurrent pneumonia, hypertension, GERD, and allergic rhinitis who presents for 5-6-day history of sinus congestion, postnasal drip, runny nose, productive cough, and shortness of breath has been worsening despite being seen yesterday for similar symptoms and placed on antibiotics.  Denies any fever, chills, vomiting, diarrhea, chest pain.  Patient does endorse feeling "sweaty" now.  Patient states that she has a pulse oximeter at home and states that it has read as low as 86 after going upstairs and returns to normal after sitting down ?Physical Exam  ?Triage Vital Signs: ?ED Triage Vitals  ?Enc Vitals Group  ?   BP 05/06/21 1524 (!) 149/61  ?   Pulse Rate 05/06/21 1524 95  ?   Resp 05/06/21 1524 (!) 22  ?   Temp 05/06/21 1524 99.5 ?F (37.5 ?C)  ?   Temp Source 05/06/21 1524 Oral  ?   SpO2 05/06/21 1524 (!) 87 %  ?   Weight 05/06/21 1519 189 lb (85.7 kg)  ?   Height 05/06/21 1519 '5\' 5"'$  (1.651 m)  ?   Head Circumference --   ?   Peak Flow --   ?   Pain Score 05/06/21 1519 0  ?   Pain Loc --   ?   Pain Edu? --   ?   Excl. in [place]? --   ? ?Most recent vital signs: ?Vitals:  ? 05/06/21 1800 05/06/21 1830  ?BP: (!) 146/105 106/65  ?Pulse: 98 97  ?Resp: 17 19  ?Temp:    ?SpO2: 97% 96%  ? ?General: Awake, oriented x4. ?CV:  Good peripheral perfusion.  ?Resp:  Normal effort.  Rhonchi over bilateral lung fields with left>right ?Abd:  No distention.  ?Other:  Obese middle-aged Caucasian female laying in bed in no distress ?ED Results / Procedures / Treatments  ?Labs ?(all labs ordered are listed, but only abnormal results are displayed) ?Labs Reviewed  ?BASIC METABOLIC PANEL - Abnormal; Notable for the following components:  ?    Result Value  ? Glucose, Bld  162 (*)   ? Creatinine, Ser 1.19 (*)   ? GFR, Estimated 51 (*)   ? All other components within normal limits  ?CBC - Abnormal; Notable for the following components:  ? Hemoglobin 15.8 (*)   ? HCT 48.1 (*)   ? All other components within normal limits  ?RESP PANEL BY RT-PCR (FLU A&B, COVID) [id number]  ? ?EKG ?ED ECG REPORT ?I, Naaman Plummer, the attending physician, personally viewed and interpreted this ECG. ?Date: 05/06/2021 ?EKG Time: 1532 ?Rate: 96 ?Rhythm: normal sinus rhythm ?QRS Axis: normal ?Intervals: normal ?ST/T Wave abnormalities: normal ?Narrative Interpretation: no evidence of acute ischemia ?RADIOLOGY ?ED MD interpretation: One-view portable chest x-ray interpreted by me shows no evidence of acute abnormalities including no pneumonia, pneumothorax, or widened mediastinum ?-Agree with radiology assessment ?Official radiology report(s): ?DG Chest Port 1 View ? ?Result Date: 05/06/2021 ?CLINICAL DATA:  Cough, hypoxia EXAM: PORTABLE CHEST 1 VIEW COMPARISON:  05/05/2021 FINDINGS: Cardiac size is within normal limits. There are no signs of pulmonary edema or focal pulmonary consolidation. There is no pleural effusion or pneumothorax. IMPRESSION: No active disease. Electronically Signed   By: Elmer Picker M.D.   On:  05/06/2021 16:53   ?PROCEDURES: ?Critical Care performed: Yes, see critical care procedure note(s) ?.1-3 Lead EKG Interpretation ?Performed by: Naaman Plummer, MD ?Authorized by: Naaman Plummer, MD  ? ?  Interpretation: normal   ?  ECG rate:  97 ?  ECG rate assessment: normal   ?  Rhythm: sinus rhythm   ?  Ectopy: none   ?  Conduction: normal   ?MEDICATIONS ORDERED IN ED: ?Medications  ?ipratropium-albuterol (DUONEB) 0.5-2.5 (3) MG/3ML nebulizer solution 3 mL (3 mLs Nebulization Given 05/06/21 1628)  ?methylPREDNISolone sodium succinate (SOLU-MEDROL) 125 mg/2 mL injection 125 mg (125 mg Intravenous Given 05/06/21 1741)  ?famotidine (PEPCID) IVPB 20 mg premix (0 mg Intravenous Stopped 05/06/21  1815)  ? ?IMPRESSION / MDM / ASSESSMENT AND PLAN / ED COURSE  ?I reviewed the triage vital signs and the nursing notes. ?             ?               ?Differential diagnosis includes, but is not limited to, asthma exacerbation, anaphylaxis, acute bronchitis, pneumonia, sepsis ?The patient is on the cardiac monitor to evaluate for evidence of arrhythmia and/or significant heart rate changes. ?Patient is 66 year old female with the above-stated past medical history presents for worsening shortness of breath as well as hypoxia at home and with exertion.  Patient states that on home pulse ox her oxygenation went down as far as 86%.  Patient was given a breathing treatment here prior to a trial of ambulation that also saw patient's oxygen dropped to 88 and her respiratory rate going up into the low 30s.  Patient's chest x-ray shows no signs of infiltrate.  Patient is negative for flu and COVID-19.  After further evaluation, patient did state that she had a known allergy to penicillins and was placed on amoxicillin for this presumed upper respiratory infection after participating in a study at an outside hospital and was told that she was no longer allergic to penicillins.  Patient now endorses that she has been having mouth tingling as well as a sensation of some throat swelling since this morning.  Therefore, I gave patient 125 mg Solu-Medrol, 20 mg of Pepcid, and repeat DuoNeb with little improvement of patient's ambulatory oxygen saturation.  Given patient's persistent hypoxia upon exertion, patient will require admission to the internal medicine service for further evaluation and management. ? ?Dispo: Admit to medicine ? ?  ?FINAL CLINICAL IMPRESSION(S) / ED DIAGNOSES  ? ?Final diagnoses:  ?Acute respiratory failure with hypoxia ([place])  ?Shortness of breath  ?Bronchitis  ?Allergic reaction to drug, initial encounter  ? ?Rx / DC Orders  ? ?ED Discharge Orders   ? ? None  ? ?  ? ?Note:  This document was prepared using  Dragon voice recognition software and may include unintentional dictation errors. ?  ?Naaman Plummer, MD ?05/06/21 1856 ? ?

## 2021-05-06 NOTE — Assessment & Plan Note (Addendum)
Lab Results  ?Component Value Date  ? CREATININE 1.19 (H) 05/06/2021  ? CREATININE 1.00 05/09/2020  ? CREATININE 0.85 08/10/2019  ?mild elevation of creatinine due to medication and decreased po intake.  ?Condition stable. ?

## 2021-05-07 ENCOUNTER — Observation Stay (HOSPITAL_BASED_OUTPATIENT_CLINIC_OR_DEPARTMENT_OTHER)
Admit: 2021-05-07 | Discharge: 2021-05-07 | Disposition: A | Discharge status: Home / Self Care | Payer: Medicare (Managed Care) | Attending: Internal Medicine | Admitting: Internal Medicine

## 2021-05-07 DIAGNOSIS — I10 Essential (primary) hypertension: Secondary | ICD-10-CM | POA: Diagnosis not present

## 2021-05-07 DIAGNOSIS — N179 Acute kidney failure, unspecified: Secondary | ICD-10-CM | POA: Diagnosis not present

## 2021-05-07 DIAGNOSIS — R0602 Shortness of breath: Secondary | ICD-10-CM | POA: Diagnosis not present

## 2021-05-07 DIAGNOSIS — J189 Pneumonia, unspecified organism: Secondary | ICD-10-CM

## 2021-05-07 LAB — GLUCOSE, CAPILLARY: Glucose-Capillary: 193 mg/dL — ABNORMAL HIGH (ref 70–99)

## 2021-05-07 LAB — ECHOCARDIOGRAM COMPLETE
AR max vel: 2.04 cm2
AV Area VTI: 2.26 cm2
AV Area mean vel: 2.1 cm2
AV Mean grad: 7 mmHg
AV Peak grad: 13.2 mmHg
Ao pk vel: 1.82 m/s
Area-P 1/2: 7.82 cm2
Height: 65 in
MV VTI: 3.61 cm2
S' Lateral: 2.78 cm
Weight: 3024 oz

## 2021-05-07 LAB — CBC
HCT: 44.8 % (ref 36.0–46.0)
Hemoglobin: 14.8 g/dL (ref 12.0–15.0)
MCH: 32 pg (ref 26.0–34.0)
MCHC: 33 g/dL (ref 30.0–36.0)
MCV: 97 fL (ref 80.0–100.0)
Platelets: 197 10*3/uL (ref 150–400)
RBC: 4.62 MIL/uL (ref 3.87–5.11)
RDW: 11.5 % (ref 11.5–15.5)
WBC: 6.6 10*3/uL (ref 4.0–10.5)
nRBC: 0 % (ref 0.0–0.2)

## 2021-05-07 LAB — COMPREHENSIVE METABOLIC PANEL
ALT: 26 U/L (ref 0–44)
AST: 29 U/L (ref 15–41)
Albumin: 4.1 g/dL (ref 3.5–5.0)
Alkaline Phosphatase: 56 U/L (ref 38–126)
Anion gap: 7 (ref 5–15)
BUN: 17 mg/dL (ref 8–23)
CO2: 25 mmol/L (ref 22–32)
Calcium: 9.4 mg/dL (ref 8.9–10.3)
Chloride: 103 mmol/L (ref 98–111)
Creatinine, Ser: 1.07 mg/dL — ABNORMAL HIGH (ref 0.44–1.00)
GFR, Estimated: 58 mL/min — ABNORMAL LOW (ref >60)
Glucose, Bld: 209 mg/dL — ABNORMAL HIGH (ref 70–99)
Potassium: 4.4 mmol/L (ref 3.5–5.1)
Sodium: 135 mmol/L (ref 135–145)
Total Bilirubin: 0.7 mg/dL (ref 0.3–1.2)
Total Protein: 8.2 g/dL — ABNORMAL HIGH (ref 6.5–8.1)

## 2021-05-07 LAB — HEMOGLOBIN A1C
Hgb A1c MFr Bld: 5.4 % (ref 4.8–5.6)
Mean Plasma Glucose: 108 mg/dL

## 2021-05-07 LAB — PROCALCITONIN: Procalcitonin: <0.1 ng/mL

## 2021-05-07 LAB — HIV ANTIBODY (ROUTINE TESTING W REFLEX): HIV Screen 4th Generation wRfx: NONREACTIVE

## 2021-05-07 MED ORDER — BENZONATATE 100 MG PO CAPS: 200.0000 mg | ORAL_CAPSULE | Freq: Three times a day (TID) | ORAL | Status: DC | PRN

## 2021-05-07 MED ORDER — DOXYCYCLINE HYCLATE 100 MG PO TBEC: 100.0000 mg | DELAYED_RELEASE_TABLET | Freq: Two times a day (BID) | ORAL | 0 refills | Status: AC

## 2021-05-07 MED ORDER — ALBUTEROL SULFATE HFA 108 (90 BASE) MCG/ACT IN AERS: 2.0000 | INHALATION_SPRAY | Freq: Four times a day (QID) | RESPIRATORY_TRACT | 0 refills | Status: DC | PRN

## 2021-05-07 NOTE — Hospital Course (Signed)
Patient is a 66 year old female with history of essential hypertension, diet-controlled diabetes who present to the hospital with short of breath and hypoxemia.  Her husband got sick about 2 weeks ago, she had upper respite symptoms about 5 days ago. ?Her initial chest CT scan showed atypical presentation of pneumonia.  Procalcitonin level less than 0.1.  Patient was treated with broad-spectrum antibiotics.  Her condition has improved, she is medically stable to be discharged. ?

## 2021-05-07 NOTE — Telephone Encounter (Signed)
Agree with plan. Thanks

## 2021-05-07 NOTE — Progress Notes (Signed)
Pt up to floor at approx 2200. Pt A&O x4. Requiring 1 L [place] overnight. No c/o pain. Updated on POC w/ verbalized understanding. Medication administration per MAR. Full assessment per flowsheets. Oriented to room and use of call bell. Ambulates steadily on feet, needs assistance with IV pole at times. Pt making needs known. Comfort and safety maintained overnight.  ?

## 2021-05-07 NOTE — Discharge Summary (Signed)
Chest ?Physician Discharge Summary ?  ?Patient: Alexandria Fry MRN: [id number] DOB: [birth year]  ?Admit date:     05/06/2021  ?Discharge date: 05/07/21  ?Discharge Physician: Sharen Hones  ? ?PCP: Tonia Ghent, MD  ? ?Recommendations at discharge:  ? ?Follow-up with PCP in 1 week ?Repeat CT scan noncontrast in 3 to 6 months for lung nodule. ?Echocardiogram is pending, please follow-up with PCP for results. ? ?Discharge Diagnoses: ?Principal Problem: ?  Cough ?Active Problems: ?  Respiratory failure with hypoxia ([place]) ?  Essential hypertension ?  Diabetes ([place]) ?  Non-toxic multinodular goiter ?  Atypical pneumonia ?  AKI (acute kidney injury) ([place]) ? ?Resolved Problems: ?  * No resolved hospital problems. * ? ?Hospital Course: ?Patient is a 66 year old female with history of essential hypertension, diet-controlled diabetes who present to the hospital with short of breath and hypoxemia.  Her husband got sick about 2 weeks ago, she had upper respite symptoms about 5 days ago. ?Her initial chest CT scan showed atypical presentation of pneumonia.  Procalcitonin level less than 0.1.  Patient was treated with broad-spectrum antibiotics.  Her condition has improved, she is medically stable to be discharged. ? ?Assessment and Plan: ?* Cough ?Continue as needed albuterol. ? ? ?Respiratory failure with hypoxia ([place]) ?This most likely due to atypical pneumonia and acute bronchitis.  Condition much improved, patient off oxygen without any short of breath. ? ?Essential hypertension ?Resume home medicines. ? ? ?Diabetes ([place]) ?Pt states she is no longer a diabetic and her last a1c was 5.1.  Follow-up with PCP as outpatient ? ? ? ?Non-toxic multinodular goiter ?Follow-up with PCP as outpatient. ? ? ?AKI (acute kidney injury) ([place]) ?Lab Results  ?Component Value Date  ? CREATININE 1.19 (H) 05/06/2021  ? CREATININE 1.00 05/09/2020  ? CREATININE 0.85 08/10/2019  ?mild elevation of creatinine due to medication and decreased po  intake.  ?Condition stable. ? ?Atypical pneumonia ?Reviewed patient CT scan, consistent with atypical pneumonia.  Procalcitonin level less than 0.1.  She will be treated with additional 6 days of doxycycline. ?Due to atypical presentation of CT scan for pneumonia, a small nodule cannot be ruled out.  Recommend 3 to 34-monthfollow-up with a noncontrast CT scan. ? ? ? ? ?  ? ? ?Consultants: None ?Procedures performed: None  ?Disposition: Home ?Diet recommendation:  ?Discharge Diet Orders (From admission, onward)  ? ?  Start     Ordered  ? 05/07/21 0000  Diet - low sodium heart healthy       ? 05/07/21 1003  ? ?  ?  ? ?  ? ?Cardiac diet ?DISCHARGE MEDICATION: ?Allergies as of 05/07/2021   ? ?   Reactions  ? Amlodipine Other (See Comments), Swelling  ? edema  ? Azithromycin Hives  ? Clavulanic Acid Swelling  ? It is noted that the patient tolerates Augmentin 500/125 mg, but not 875/125 mg. Reaction is dose dependent on amoxicillin.  ? Hydrochlorothiazide Other (See Comments), Hives  ? Other Swelling  ? "made skin peel off" ?Feed and hands turned red  ? Sulfa Antibiotics Other (See Comments)  ? Tongue swells ?Augmenting,ampicillin,bactrium ds  ? Ace Inhibitors Other (See Comments)  ? Would avoid.  She has h/o lip swelling with other meds.  ?Tachy  ? Angiotensin Receptor Blockers Other (See Comments)  ? Would avoid.  She has h/o lip swelling with other meds.   ? Clonidine Derivatives Other (See Comments)  ? bradycardia  ? Diphenhydramine Hcl   ?  Benadryl cream causes local skin irritation.  Would avoid med.  She can tolerate zyrtec.   ? Fentanyl Other (See Comments)  ? She felt diffusely awful while on med  ? Hydrochlorothiazide W-triamterene   ? Possible hives and GI upset  ? Hydrocodone   ? intolerant  ? Metformin And Related   ? rash  ? Metoprolol   ? Bradycardia.  ? Nitrofurantoin Other (See Comments)  ? rash  ? Oxycodone Other (See Comments)  ? intolerant  ? Tetanus Toxoids   ? Local swelling, can consider split  dosing 2 weeks apart  ? Cefuroxime Axetil Rash  ? Rash  ? Levofloxacin In D5w Itching  ? Severe itching and flushing.  ? ?  ? ?  ?Medication List  ?  ? ?STOP taking these medications   ? ?Alrex 0.2 % Susp ?Generic drug: loteprednol ?  ?amoxicillin 500 MG capsule ?Commonly known as: AMOXIL ?  ?amoxicillin 875 MG tablet ?Commonly known as: AMOXIL ?  ?hydrocortisone 2.5 % cream ?  ?ketoconazole 2 % shampoo ?Commonly known as: NIZORAL ?  ?pramoxine-hydrocortisone cream ?  ? ?  ? ?TAKE these medications   ? ?albuterol 108 (90 Base) MCG/ACT inhaler ?Commonly known as: VENTOLIN HFA ?Inhale 2 puffs into the lungs every 6 (six) hours as needed for wheezing or shortness of breath. ?  ?doxycycline 100 MG EC tablet ?Commonly known as: DORYX ?Take 1 tablet (100 mg total) by mouth 2 (two) times daily for 6 days. ?  ?multivitamin tablet ?Take 1 tablet by mouth daily. ?  ?spironolactone 50 MG tablet ?Commonly known as: ALDACTONE ?TAKE 1 TABLET(50 MG) BY MOUTH DAILY ?What changed: Another medication with the same name was removed. Continue taking this medication, and follow the directions you see here. ?  ?VITAMIN K2-VITAMIN D3 PO ?Take by mouth. ?  ? ?  ? ? Follow-up Information   ? ? Tonia Ghent, MD Follow up in 1 week(s).   ?Specialty: Family Medicine ?Contact information: ?[address] ?[place] [address] ?[phone] ? ? ?  ?  ? ?  ?  ? ?  ? ?Discharge Exam: ?Filed Weights  ? 05/06/21 1519  ?Weight: 85.7 kg  ? ?General exam: Appears calm and comfortable  ?Respiratory system: Clear to auscultation. Respiratory effort normal. ?Cardiovascular system: S1 & S2 heard, RRR. No JVD, murmurs, rubs, gallops or clicks. No pedal edema. ?Gastrointestinal system: Abdomen is nondistended, soft and nontender. No organomegaly or masses felt. Normal bowel sounds heard. ?Central nervous system: Alert and oriented. No focal neurological deficits. ?Extremities: Symmetric 5 x 5 power. ?Skin: No rashes, lesions or  ulcers ?Psychiatry: Judgement and insight appear normal. Mood & affect appropriate.  ? ? ?Condition at discharge: good ? ?The results of significant diagnostics from this hospitalization (including imaging, microbiology, ancillary and laboratory) are listed below for reference.  ? ?Imaging Studies: ?DG Chest 2 View ? ?Result Date: 05/05/2021 ?CLINICAL DATA:  66 year old female with productive cough, shortness of breath. Runny nose and chest congestion. EXAM: CHEST - 2 VIEW COMPARISON:  Chest radiographs 03/18/2015 and earlier. FINDINGS: Ste larger lung volumes compared to 2017, upper limits of normal now. Normal cardiac size and mediastinal contours. Visualized tracheal air column is within normal limits. Lung markings appear stable and both lungs appear clear with no pneumothorax or pleural effusion. No acute osseous abnormality identified. Negative visible bowel gas. IMPRESSION: No acute cardiopulmonary abnormality. Electronically Signed   By: Genevie Ann M.D.   On: 05/05/2021 11:20  ? ?  CT Angio Chest PE W/Cm &/Or Wo Cm ? ?Result Date: 05/06/2021 ?CLINICAL DATA:  Pulmonary embolism (PE) suspected, high prob EXAM: CT ANGIOGRAPHY CHEST WITH CONTRAST TECHNIQUE: Multidetector CT imaging of the chest was performed using the standard protocol during bolus administration of intravenous contrast. Multiplanar CT image reconstructions and MIPs were obtained to evaluate the vascular anatomy. RADIATION DOSE REDUCTION: This exam was performed according to the departmental dose-optimization program which includes automated exposure control, adjustment of the mA and/or kV according to patient size and/or use of iterative reconstruction technique. CONTRAST:  2m OMNIPAQUE IOHEXOL 350 MG/ML SOLN COMPARISON:  Chest x-ray 05/06/2021 FINDINGS: Cardiovascular: Satisfactory opacification of the pulmonary arteries to the segmental level. No evidence of pulmonary embolism. Normal heart size. No significant pericardial effusion. The thoracic  aorta is normal in caliber. Mild atherosclerotic plaque of the thoracic aorta. Left anterior descending coronary artery calcifications. Mediastinum/Nodes: No enlarged mediastinal, hilar, or axillary lymph nodes. Thyroid gland, tr

## 2021-05-07 NOTE — Assessment & Plan Note (Addendum)
Reviewed patient CT scan, consistent with atypical pneumonia.  Procalcitonin level less than 0.1.  She will be treated with additional 6 days of doxycycline. ?Due to atypical presentation of CT scan for pneumonia, a small nodule cannot be ruled out.  Recommend 3 to 73-monthfollow-up with a noncontrast CT scan. ?

## 2021-05-07 NOTE — Progress Notes (Signed)
*  PRELIMINARY RESULTS* ?Echocardiogram ?2D Echocardiogram has been performed. ? ?Alexandria Fry ?05/07/2021, 10:07 AM ?

## 2021-05-08 ENCOUNTER — Encounter: Payer: Self-pay | Admitting: Family Medicine

## 2021-05-15 ENCOUNTER — Encounter: Payer: Self-pay | Admitting: Family Medicine

## 2021-05-15 ENCOUNTER — Ambulatory Visit: Payer: Medicare (Managed Care) | Admitting: Family Medicine

## 2021-05-15 DIAGNOSIS — R739 Hyperglycemia, unspecified: Secondary | ICD-10-CM | POA: Diagnosis not present

## 2021-05-15 DIAGNOSIS — E042 Nontoxic multinodular goiter: Secondary | ICD-10-CM | POA: Diagnosis not present

## 2021-05-15 DIAGNOSIS — R911 Solitary pulmonary nodule: Secondary | ICD-10-CM

## 2021-05-15 DIAGNOSIS — R051 Acute cough: Secondary | ICD-10-CM | POA: Diagnosis not present

## 2021-05-15 DIAGNOSIS — I10 Essential (primary) hypertension: Secondary | ICD-10-CM

## 2021-05-15 NOTE — Patient Instructions (Addendum)
Update me as needed.  ?The cough should gradually get better.  ?Take care.  Glad to see you. ?Plan on recheck CT scan in about 3 months.  ?Okay to use albuterol if needed for cough.   ?

## 2021-05-15 NOTE — Progress Notes (Signed)
Discussed multiple recent events/findings.   ? ?Recent echo discussed with patient. ? 1. Left ventricular ejection fraction, by estimation, is 60 to 65%. The  ?left ventricle has normal function. The left ventricle has no regional  ?wall motion abnormalities. There is mild left ventricular hypertrophy.  ?Left ventricular diastolic parameters  ?are consistent with Grade I diastolic dysfunction (impaired relaxation).  ? 2. Right ventricular systolic function is normal. The right ventricular  ?size is normal.  ? ?Discussed that the management going forward would be to control her blood pressure.  Her BP has been good on home check.   ? ?Recent hyperglycemia/glucose readings discussed with patient.  She is not diabetic by A1c.   Likely some of her readings were related to illness and steroid use. ? ?This is a patient about recent pulmonary nodule noted and getting follow-up CT in 3 months. ? ?Covid vaccine VAERS d/w pt.  Would hold on extra vaccination for now.  Discussed with patient about previous hair loss. ? ?Cough/ER f/u.  Cough is better in the meantime. Some cough but improved.  No FCNAVD.  No wheeze now.   ? ?Thyroid function, goiter d/w pt.  She has u/s thyroid done 05/2020.  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  TSH 0.625.  normal T4 recently.  ? ?She has positional snoring but not waking gasping for air. Her sleeping and breathing is clearly better with weight loss and it is reasonable to defer sleep study for now.   ? ?Meds, vitals, and allergies reviewed.  ? ?ROS: Per HPI unless specifically indicated in ROS section  ? ?GEN: nad, alert and oriented ?HEENT: mucous membranes moist, TM WNL ?NECK: supple w/o LA, thyroid enlargement noted. ?CV: rrr.  no murmur ?PULM: Coarse BS, dry cough noted.  No wheeze.  ?ABD: soft, +bs ?EXT: no edema ?SKIN: well perfused.  ? ?30 minutes were devoted to patient care in this encounter (this includes time spent reviewing the patient's  file/history, interviewing and examining the patient, counseling/reviewing plan with patient).  ? ?

## 2021-05-18 ENCOUNTER — Other Ambulatory Visit: Payer: Self-pay | Admitting: Family Medicine

## 2021-05-18 ENCOUNTER — Encounter: Payer: Self-pay | Admitting: Family Medicine

## 2021-05-19 ENCOUNTER — Other Ambulatory Visit: Payer: Self-pay | Admitting: Dermatology

## 2021-05-19 DIAGNOSIS — L659 Nonscarring hair loss, unspecified: Secondary | ICD-10-CM

## 2021-05-19 DIAGNOSIS — R911 Solitary pulmonary nodule: Secondary | ICD-10-CM | POA: Insufficient documentation

## 2021-05-19 NOTE — Assessment & Plan Note (Signed)
Incidentally noted, we can arrange for follow-up imaging in approximately 3 months. ?

## 2021-05-19 NOTE — Assessment & Plan Note (Signed)
Reasonable to try albuterol in the meantime and update me as needed.  Given her recent illness we will hold off on extra vaccination at this point. ?

## 2021-05-19 NOTE — Assessment & Plan Note (Signed)
She has benign pathology on previous biopsy and her most recent TSH is normal, along with T4.  She has positional snoring but she is sleeping and breathing better with weight loss and is reasonable to defer sleep study for now.  She can update me as needed. ?

## 2021-05-19 NOTE — Assessment & Plan Note (Signed)
Discussed goal blood pressure control given her echo.  Blood pressures been controlled recently.  Continue spironolactone for now. ?

## 2021-05-19 NOTE — Assessment & Plan Note (Signed)
Not diabetic based on A1c.  Discussed. ?

## 2021-07-07 ENCOUNTER — Ambulatory Visit: Payer: Managed Care, Other (non HMO) | Admitting: Dermatology

## 2021-07-10 ENCOUNTER — Other Ambulatory Visit: Payer: Self-pay | Admitting: Family Medicine

## 2021-08-03 ENCOUNTER — Telehealth: Payer: Self-pay | Admitting: Family Medicine

## 2021-08-03 DIAGNOSIS — R911 Solitary pulmonary nodule: Secondary | ICD-10-CM

## 2021-08-03 NOTE — Telephone Encounter (Signed)
Please contact patient.  Due for follow-up CT scan to evaluate previously imaged lung nodule.  I pended the order below.  If she consents I will sign.  Thanks.

## 2021-08-04 NOTE — Telephone Encounter (Signed)
Patient notified as instructed by telephone and verbalized understanding. Patient stated that she is okay with scheduling the follow-up CT scan.Patient stated that she would like to have this done in [place].  Patient was advised that one of the referral coordinators will be in touch to get this set up.

## 2021-08-05 NOTE — Telephone Encounter (Signed)
Noted.  Order signed.  Thanks.

## 2021-09-02 ENCOUNTER — Ambulatory Visit
Admission: RE | Admit: 2021-09-02 | Discharge: 2021-09-02 | Disposition: A | Discharge status: Home / Self Care | Payer: Medicare (Managed Care) | Source: Ambulatory Visit | Attending: Family Medicine | Admitting: Family Medicine

## 2021-09-02 DIAGNOSIS — R911 Solitary pulmonary nodule: Secondary | ICD-10-CM | POA: Insufficient documentation

## 2021-09-30 ENCOUNTER — Other Ambulatory Visit: Payer: Self-pay | Admitting: Dermatology

## 2021-09-30 ENCOUNTER — Encounter: Payer: Self-pay | Admitting: Dermatology

## 2021-09-30 ENCOUNTER — Ambulatory Visit (INDEPENDENT_AMBULATORY_CARE_PROVIDER_SITE_OTHER): Payer: Medicare (Managed Care) | Admitting: Dermatology

## 2021-09-30 DIAGNOSIS — L650 Telogen effluvium: Secondary | ICD-10-CM | POA: Diagnosis not present

## 2021-09-30 DIAGNOSIS — L988 Other specified disorders of the skin and subcutaneous tissue: Secondary | ICD-10-CM | POA: Diagnosis not present

## 2021-09-30 DIAGNOSIS — L649 Androgenic alopecia, unspecified: Secondary | ICD-10-CM

## 2021-09-30 DIAGNOSIS — L659 Nonscarring hair loss, unspecified: Secondary | ICD-10-CM

## 2021-09-30 MED ORDER — SPIRONOLACTONE 50 MG PO TABS: 50.0000 mg | ORAL_TABLET | Freq: Every day | ORAL | 1 refills | Status: DC

## 2021-09-30 NOTE — Patient Instructions (Addendum)
Discuss adding oral Minoxidil 1.25 mg once daily with Dr. Damita Dunnings.  Androgenic alopecia is a chronic condition related to genetics and/or hormonal changes associated with menopause causing hair thinning primarily on the crown with widening of the part and temporal hairline recession.  Can use OTC Rogaine (minoxidil) 5% solution/foam as directed. Instructions given.   Telogen effluvium is a benign, self limited condition causing increased hair shedding usually for several months. It does not progress to baldness, and the hair eventually grows back on its own. It can be triggered by recent illness, recent surgery, thyroid disease, low iron stores, vitamin D deficiency, fad diets or rapid weight loss, hormonal changes such as pregnancy or birth control pills, and some medication. Usually the hair loss starts 2-3 months after the illness or health change. Rarely, it can continue for longer than a year.   Continue  Spironolactone '50mg'$  take 1 po QD for antiandrogen effect.  BP 148/68   Continue Minoxidil 5% topical foam once to twice daily as tolerated, well-tolerated with no side effects (she has had some side effects with oral Ace inhibitors in past).   Spironolactone can cause increased urination and cause blood pressure to decrease. Please watch for signs of lightheadedness and be cautious when changing position. It can sometimes cause breast tenderness or an irregular period in premenopausal women. It can also increase potassium. The increase in potassium usually is not a concern unless you are taking other medicines that also increase potassium, so please be sure your doctor knows all of the other medications you are taking. This medication should not be taken by pregnant women.  This medicine should also not be taken together with sulfa drugs like Bactrim (trimethoprim/sulfamethexazole).     Due to recent changes in healthcare laws, you may see results of your pathology and/or laboratory studies on  MyChart before the doctors have had a chance to review them. We understand that in some cases there may be results that are confusing or concerning to you. Please understand that not all results are received at the same time and often the doctors may need to interpret multiple results in order to provide you with the best plan of care or course of treatment. Therefore, we ask that you please give [place] 2 business days to thoroughly review all your results before contacting the office for clarification. Should we see a critical lab result, you will be contacted sooner.   If You Need Anything After Your Visit  If you have any questions or concerns for your doctor, please call our main line at [phone] and press option 4 to reach your doctor's medical assistant. If no one answers, please leave a voicemail as directed and we will return your call as soon as possible. Messages left after 4 pm will be answered the following business day.   You may also send [place] a message via [place]. We typically respond to MyChart messages within 1-2 business days.  For prescription refills, please ask your pharmacy to contact our office. Our fax number is [phone].  If you have an urgent issue when the clinic is closed that cannot wait until the next business day, you can page your doctor at the number below.    Please note that while we do our best to be available for urgent issues outside of office hours, we are not available 24/7.   If you have an urgent issue and are unable to reach [place], you may choose to seek medical care at your  doctor's office, retail clinic, urgent care center, or emergency room.  If you have a medical emergency, please immediately call 911 or go to the emergency department.  Pager Numbers  - Dr. Nehemiah Massed: [phone]  - Dr. Laurence Ferrari: [phone]  - Dr. Nicole Kindred: [phone]  In the event of inclement weather, please call our main line at [phone] for an update on the status of  any delays or closures.  Dermatology Medication Tips: Please keep the boxes that topical medications come in in order to help keep track of the instructions about where and how to use these. Pharmacies typically print the medication instructions only on the boxes and not directly on the medication tubes.   If your medication is too expensive, please contact our office at [phone] option 4 or send [place] a message through [place].   We are unable to tell what your co-pay for medications will be in advance as this is different depending on your insurance coverage. However, we may be able to find a substitute medication at lower cost or fill out paperwork to get insurance to cover a needed medication.   If a prior authorization is required to get your medication covered by your insurance company, please allow [place] 1-2 business days to complete this process.  Drug prices often vary depending on where the prescription is filled and some pharmacies may offer cheaper prices.  The website www.goodrx.com contains coupons for medications through different pharmacies. The prices here do not account for what the cost may be with help from insurance (it may be cheaper with your insurance), but the website can give you the price if you did not use any insurance.  - You can print the associated coupon and take it with your prescription to the pharmacy.  - You may also stop by our office during regular business hours and pick up a GoodRx coupon card.  - If you need your prescription sent electronically to a different pharmacy, notify our office through [facility] or by phone at [phone] option 4.     Si Usted Necesita Algo Despus de Su Visita  Tambin puede enviarnos un mensaje a travs de Pharmacist, community. Por lo general respondemos a los mensajes de MyChart en el transcurso de 1 a 2 das hbiles.  Para renovar recetas, por favor pida a su farmacia que se ponga en contacto con nuestra oficina. Harland Dingwall de fax es [place] [phone].  Si tiene un asunto urgente cuando la clnica est cerrada y que no puede esperar hasta el siguiente da hbil, puede llamar/localizar a su doctor(a) al nmero que aparece a continuacin.   Por favor, tenga en cuenta que aunque hacemos todo lo posible para estar disponibles para asuntos urgentes fuera del horario de [place], no estamos disponibles las 24 horas del da, los 7 das de la [place].   Si tiene un problema urgente y no puede comunicarse con nosotros, puede optar por buscar atencin mdica  en el consultorio de su doctor(a), en una clnica privada, en un centro de atencin urgente o en una sala de emergencias.  Si tiene Engineering geologist, por favor llame inmediatamente al 911 o vaya a la sala de emergencias.  Nmeros de bper  - Dr. Nehemiah Massed: [phone]  - Dra. Moye: [phone]  - Dra. Nicole Kindred: [phone]  En caso de inclemencias del [place], por favor llame a Johnsie Kindred principal al [phone] para una actualizacin sobre el [place] de cualquier retraso o cierre.  Consejos para la medicacin en dermatologa: Por  favor, guarde las cajas en las que vienen los medicamentos de uso tpico para ayudarle a seguir las instrucciones sobre dnde y cmo usarlos. Las farmacias generalmente imprimen las instrucciones del medicamento slo en las cajas y no directamente en los tubos del [place].   Si su medicamento es muy caro, por favor, pngase en contacto con Zigmund Daniel llamando al [phone] y presione la opcin 4 o envenos un mensaje a travs de Pharmacist, community.   No podemos decirle cul ser su copago por los medicamentos por adelantado ya que esto es diferente dependiendo de la cobertura de su seguro. Sin embargo, es posible que podamos encontrar un medicamento sustituto a Electrical engineer un formulario para que el seguro cubra el medicamento que se considera necesario.   Si se requiere una autorizacin previa para que su compaa de  seguros [place] su medicamento, por favor permtanos de 1 a 2 das hbiles para completar este proceso.  Los precios de los medicamentos varan con frecuencia dependiendo del Environmental consultant de dnde se surte la receta y alguna farmacias pueden ofrecer precios ms baratos.  El sitio web www.goodrx.com tiene cupones para medicamentos de Airline pilot. Los precios aqu no tienen en cuenta lo que podra costar con la ayuda del seguro (puede ser ms barato con su seguro), pero el sitio web puede darle el precio si no utiliz Research scientist (physical sciences).  - Puede imprimir el cupn correspondiente y llevarlo con su receta a la farmacia.  - Tambin puede pasar por nuestra oficina durante el horario de atencin regular y Charity fundraiser una tarjeta de cupones de GoodRx.  - Si necesita que su receta se enve electrnicamente a una farmacia diferente, informe a nuestra oficina a travs de MyChart de [place] o por telfono llamando al [phone] y presione la opcin 4.

## 2021-09-30 NOTE — Progress Notes (Signed)
Follow-Up Visit   Subjective  Alexandria Fry is a 66 y.o. female who presents for the following: Alopecia (9 month recheck. Scalp. Taking Spironolactone 50 mg daily, using Rogaine. Was improving then was sick again in March, hospitalized with allergic reaction to PCN. Hair breaks easily. Seems stable at this point. States does have high testosterone ).    The following portions of the chart were reviewed this encounter and updated as appropriate:      Review of Systems: No other skin or systemic complaints except as noted in HPI or Assessment and Plan.   Objective  Well appearing patient in no apparent distress; mood and affect are within normal limits.  A focused examination was performed including head, including the scalp, face, neck, nose, ears, eyelids, and lips. Relevant physical exam findings are noted in the Assessment and Plan.  Scalp Diffuse thinning of the crown and widening of the midline part with retention of the frontal hairline - Reviewed progressive nature and prognosis.   glabella Rhytides and volume loss.    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  Can use OTC Rogaine (minoxidil) 5% solution/foam as directed. Instructions given.   Telogen effluvium is a benign, self limited condition causing increased hair shedding usually for several months. It does not progress to baldness, and the hair eventually grows back on its own. It can  be triggered by recent illness, recent surgery, thyroid disease, low iron stores, vitamin D deficiency, fad diets or rapid weight loss, hormonal changes such as pregnancy or birth control pills, and some medication. Usually the hair loss starts 2-3 months after the illness or health change. Rarely, it can continue for longer than a year.   Continue  Spironolactone '50mg'$  take 1 po QD for antiandrogen effect.  BP 148/68   Continue Minoxidil 5% topical foam once to twice daily as tolerated, well-tolerated with no side effects  Patient to discuss adding oral Minoxidil 1.25 mg once daily with Dr. Damita Dunnings.   Spironolactone can cause increased urination and cause blood pressure to decrease. Please watch for signs of lightheadedness and be cautious when changing position. It can sometimes cause breast tenderness or an irregular period in premenopausal women. It can also increase potassium. The increase in potassium usually is not a concern unless you are taking other medicines that also increase potassium, so please be sure your doctor knows all of the other medications you are taking. This medication should not be taken by pregnant women.  This medicine should also not be taken together with sulfa drugs like Bactrim (trimethoprim/sulfamethexazole).    Reviewed previous photos. Improvement noted.  Related Medications spironolactone (ALDACTONE) 50 MG tablet Take 1 tablet (50 mg total) by mouth daily.  Elastosis of skin glabella  Discussed Botox cosmetic. $13 per unit. $260+ for frown complex. Will need 20-25 units. Lasts 3-4 months.   Return if symptoms worsen or fail to improve.  I, Emelia Salisbury, CMA, am acting as scribe for Brendolyn Patty, MD.  Documentation: I have reviewed the above  documentation for accuracy and completeness, and I agree with the above.  Brendolyn Patty MD

## 2021-10-08 ENCOUNTER — Other Ambulatory Visit: Payer: Self-pay | Admitting: Family Medicine

## 2021-10-08 DIAGNOSIS — E559 Vitamin D deficiency, unspecified: Secondary | ICD-10-CM

## 2021-10-08 DIAGNOSIS — R739 Hyperglycemia, unspecified: Secondary | ICD-10-CM

## 2021-10-08 DIAGNOSIS — I10 Essential (primary) hypertension: Secondary | ICD-10-CM

## 2021-10-23 ENCOUNTER — Other Ambulatory Visit: Payer: Medicare (Managed Care)

## 2021-10-23 ENCOUNTER — Ambulatory Visit: Payer: Medicare (Managed Care) | Admitting: Family Medicine

## 2021-10-30 ENCOUNTER — Ambulatory Visit: Payer: Medicare (Managed Care) | Admitting: Family Medicine

## 2021-12-18 ENCOUNTER — Other Ambulatory Visit (INDEPENDENT_AMBULATORY_CARE_PROVIDER_SITE_OTHER): Payer: Medicare (Managed Care)

## 2021-12-18 DIAGNOSIS — I10 Essential (primary) hypertension: Secondary | ICD-10-CM

## 2021-12-18 DIAGNOSIS — R739 Hyperglycemia, unspecified: Secondary | ICD-10-CM

## 2021-12-18 DIAGNOSIS — E559 Vitamin D deficiency, unspecified: Secondary | ICD-10-CM

## 2021-12-18 LAB — LIPID PANEL
Cholesterol: 238 mg/dL — ABNORMAL HIGH (ref 0–200)
HDL: 50.2 mg/dL (ref >39.00)
NonHDL: 187.74
Total CHOL/HDL Ratio: 5
Triglycerides: 279 mg/dL — ABNORMAL HIGH (ref 0.0–149.0)
VLDL: 55.8 mg/dL — ABNORMAL HIGH (ref 0.0–40.0)

## 2021-12-18 LAB — COMPREHENSIVE METABOLIC PANEL
ALT: 17 U/L (ref 0–35)
AST: 19 U/L (ref 0–37)
Albumin: 4.3 g/dL (ref 3.5–5.2)
Alkaline Phosphatase: 53 U/L (ref 39–117)
BUN: 21 mg/dL (ref 6–23)
CO2: 28 mEq/L (ref 19–32)
Calcium: 10.1 mg/dL (ref 8.4–10.5)
Chloride: 99 mEq/L (ref 96–112)
Creatinine, Ser: 1.17 mg/dL (ref 0.40–1.20)
GFR: 48.93 mL/min — ABNORMAL LOW (ref >60.00)
Glucose, Bld: 116 mg/dL — ABNORMAL HIGH (ref 70–99)
Potassium: 4.2 mEq/L (ref 3.5–5.1)
Sodium: 136 mEq/L (ref 135–145)
Total Bilirubin: 0.7 mg/dL (ref 0.2–1.2)
Total Protein: 7.3 g/dL (ref 6.0–8.3)

## 2021-12-18 LAB — CBC WITH DIFFERENTIAL/PLATELET
Basophils Absolute: 0.1 10*3/uL (ref 0.0–0.1)
Basophils Relative: 0.8 % (ref 0.0–3.0)
Eosinophils Absolute: 0.1 10*3/uL (ref 0.0–0.7)
Eosinophils Relative: 1.1 % (ref 0.0–5.0)
HCT: 44.1 % (ref 36.0–46.0)
Hemoglobin: 14.8 g/dL (ref 12.0–15.0)
Lymphocytes Relative: 15.2 % (ref 12.0–46.0)
Lymphs Abs: 1.1 10*3/uL (ref 0.7–4.0)
MCHC: 33.5 g/dL (ref 30.0–36.0)
MCV: 98.5 fl (ref 78.0–100.0)
Monocytes Absolute: 0.6 10*3/uL (ref 0.1–1.0)
Monocytes Relative: 8.3 % (ref 3.0–12.0)
Neutro Abs: 5.6 10*3/uL (ref 1.4–7.7)
Neutrophils Relative %: 74.6 % (ref 43.0–77.0)
Platelets: 206 10*3/uL (ref 150.0–400.0)
RBC: 4.48 Mil/uL (ref 3.87–5.11)
RDW: 12.2 % (ref 11.5–15.5)
WBC: 7.6 10*3/uL (ref 4.0–10.5)

## 2021-12-18 LAB — TSH: TSH: 1.34 u[IU]/mL (ref 0.35–5.50)

## 2021-12-18 LAB — VITAMIN D 25 HYDROXY (VIT D DEFICIENCY, FRACTURES): VITD: 43.48 ng/mL (ref 30.00–100.00)

## 2021-12-18 LAB — HEMOGLOBIN A1C: Hgb A1c MFr Bld: 5.9 % (ref 4.6–6.5)

## 2021-12-18 LAB — LDL CHOLESTEROL, DIRECT: Direct LDL: 127 mg/dL

## 2021-12-25 ENCOUNTER — Ambulatory Visit (INDEPENDENT_AMBULATORY_CARE_PROVIDER_SITE_OTHER): Payer: Medicare (Managed Care) | Admitting: Family Medicine

## 2021-12-25 ENCOUNTER — Encounter: Payer: Self-pay | Admitting: Family Medicine

## 2021-12-25 VITALS — BP 126/78 | HR 89 | Temp 98.0°F | Ht 65.0 in | Wt 199.0 lb

## 2021-12-25 DIAGNOSIS — Z7189 Other specified counseling: Secondary | ICD-10-CM

## 2021-12-25 DIAGNOSIS — Z1211 Encounter for screening for malignant neoplasm of colon: Secondary | ICD-10-CM

## 2021-12-25 DIAGNOSIS — L659 Nonscarring hair loss, unspecified: Secondary | ICD-10-CM

## 2021-12-25 DIAGNOSIS — I10 Essential (primary) hypertension: Secondary | ICD-10-CM

## 2021-12-25 DIAGNOSIS — Z0000 Encounter for general adult medical examination without abnormal findings: Secondary | ICD-10-CM | POA: Diagnosis not present

## 2021-12-25 DIAGNOSIS — M25569 Pain in unspecified knee: Secondary | ICD-10-CM

## 2021-12-25 DIAGNOSIS — E785 Hyperlipidemia, unspecified: Secondary | ICD-10-CM

## 2021-12-25 DIAGNOSIS — Z23 Encounter for immunization: Secondary | ICD-10-CM | POA: Diagnosis not present

## 2021-12-25 MED ORDER — SPIRONOLACTONE 50 MG PO TABS: ORAL_TABLET | ORAL | 3 refills | Status: DC

## 2021-12-25 MED ORDER — PRAVASTATIN SODIUM 10 MG PO TABS: 10.0000 mg | ORAL_TABLET | Freq: Every day | ORAL | 3 refills | Status: DC

## 2021-12-25 NOTE — Patient Instructions (Addendum)
Flu shot today.  PNA 20 later on. Reasonable to start a low dose statin.  Please consider that.  Take care.  Glad to see you. Please call about a mammogram.

## 2021-12-25 NOTE — Progress Notes (Signed)
I have personally reviewed the Medicare Annual Wellness questionnaire and have noted 1. The patient's medical and social history 2. Their use of alcohol, tobacco or illicit drugs 3. Their current medications and supplements 4. The patient's functional ability including ADL's, fall risks, home safety risks and hearing or visual             impairment. 5. Diet and physical activities 6. Evidence for depression or mood disorders  The patients weight, height, BMI have been recorded in the chart and visual acuity is per eye clinic.  I have made referrals, counseling and provided education to the patient based review of the above and I have provided the pt with a written personalized care plan for preventive services.  Provider list updated- see scanned forms.  Routine anticipatory guidance given to patient.  See health maintenance. The possibility exists that previously documented standard health maintenance information may have been brought forward from a previous encounter into this note.  If needed, that same information has been updated to reflect the current situation based on today's encounter.    Flu- she opted for lower dose.   Shingles discussed with patient PNA deferred today since she is getting her flu shot. Tetanus deferred. COVID-vaccine previously done. Colonoscopy 2019.  Referral placed 2023. Breast cancer screening discussed with patient. Bone density test discussed with patient. Advance directive discussed with patient.  Husband designated if patient were incapacitated. Cognitive function addressed- see scanned forms- and if abnormal then additional documentation follows.   EKG in EMR.   In addition to [facility] Wellness, follow up visit for the below conditions:  CAD d/w pt, d/w pt about statin start.  CT prev with  1. Interval resolution of previously noted right middle lobe peribronchovascular nodular infiltrate. 2. No suspicious or indeterminate pulmonary nodules  identified. 3. Mild coronary artery calcification.  Hypertension:    Using medication without problems or lightheadedness:  yes Chest pain with exertion: no Edema: no Short of breath: only from deconditioning.    D/w pt about diet and exercise, tapering empty carbs  R knee pain d/w pt.  Refer to ortho.    PMH and SH reviewed  Meds, vitals, and allergies reviewed.   ROS: Per HPI.  Unless specifically indicated otherwise in HPI, the patient denies:  General: fever. Eyes: acute vision changes ENT: sore throat Cardiovascular: chest pain Respiratory: SOB GI: vomiting GU: dysuria Musculoskeletal: acute back pain Derm: acute rash Neuro: acute motor dysfunction Psych: worsening mood Endocrine: polydipsia Heme: bleeding Allergy: hayfever  GEN: nad, alert and oriented HEENT: ncat NECK: supple w/o LA CV: rrr. PULM: ctab, no inc wob ABD: soft, +bs EXT: no edema SKIN: no acute rash Crepitus R knee on ROM.

## 2021-12-26 ENCOUNTER — Other Ambulatory Visit: Payer: Self-pay

## 2021-12-26 ENCOUNTER — Telehealth: Payer: Self-pay

## 2021-12-26 ENCOUNTER — Encounter: Payer: Self-pay | Admitting: *Deleted

## 2021-12-26 DIAGNOSIS — Z86010 Personal history of colonic polyps: Secondary | ICD-10-CM

## 2021-12-26 DIAGNOSIS — Z800 Family history of malignant neoplasm of digestive organs: Secondary | ICD-10-CM

## 2021-12-26 MED ORDER — NA SULFATE-K SULFATE-MG SULF 17.5-3.13-1.6 GM/177ML PO SOLN: 1.0000 | Freq: Once | ORAL | 0 refills | Status: AC

## 2021-12-26 NOTE — Telephone Encounter (Signed)
Gastroenterology Pre-Procedure Review  Request Date: 12/04 Requesting Physician: Dr. Vicente Males  PATIENT REVIEW QUESTIONS: The patient responded to the following health history questions as indicated:    Patient is sensitive to Opiods-discuss with anesthesia.  1. Are you having any GI issues? no 2. Do you have a personal history of Polyps? yes (last colonoscopy performed by Dr. Gustavo Lah 01/18/2018) 3. Do you have a family history of Colon Cancer or Polyps? yes (mother colorectal cancer) 4. Diabetes Mellitus? no 5. Joint replacements in the past 12 months?no 6. Major health problems in the past 3 months?no 7. Any artificial heart valves, MVP, or defibrillator?no    MEDICATIONS & ALLERGIES:    Patient reports the following regarding taking any anticoagulation/antiplatelet therapy:   Plavix, Coumadin, Eliquis, Xarelto, Lovenox, Pradaxa, Brilinta, or Effient? no Aspirin? no  Patient confirms/reports the following medications:  Current Outpatient Medications  Medication Sig Dispense Refill   Multiple Vitamin (MULTIVITAMIN) tablet Take 1 tablet by mouth daily.     pravastatin (PRAVACHOL) 10 MG tablet Take 1 tablet (10 mg total) by mouth daily. 90 tablet 3   spironolactone (ALDACTONE) 50 MG tablet TAKE 1 TABLET(50 MG) BY MOUTH DAILY 90 tablet 3   VITAMIN D, CHOLECALCIFEROL, PO Take 4,000 Units by mouth.     No current facility-administered medications for this visit.    Patient confirms/reports the following allergies:  Allergies  Allergen Reactions   Amlodipine Other (See Comments) and Swelling    edema   Azithromycin Rash   Clavulanic Acid Swelling    It is noted that the patient tolerates Augmentin 500/125 mg, but not 875/125 mg. Reaction is dose dependent on amoxicillin.   Hydrochlorothiazide Other (See Comments)    H/o sulfa allergy   Other Swelling    "made skin peel off" Feed and hands turned red   Penicillins Other (See Comments)    Tongue swells    Sulfa Antibiotics Other  (See Comments)    Tongue swells   Ace Inhibitors Other (See Comments)    Would avoid.  She has h/o lip swelling with other meds.    Albuterol     Pulse elevation.   Angiotensin Receptor Blockers Other (See Comments)    Would avoid.  She has h/o lip swelling with other meds.    Clonidine Derivatives Other (See Comments)    bradycardia   Diphenhydramine Hcl     Benadryl cream causes local skin irritation.  Would avoid med.  She can tolerate zyrtec.    Fentanyl Other (See Comments)    She felt diffusely awful while on med   Hydrochlorothiazide W-Triamterene     Possible hives and GI upset   Hydrocodone     intolerant   Metformin And Related     rash   Metoprolol     Bradycardia.   Nitrofurantoin Other (See Comments)    rash   Oxycodone Other (See Comments)    intolerant   Tetanus Toxoids     Local swelling, can consider split dosing 2 weeks apart   Cefuroxime Axetil Rash    Rash   Levofloxacin In D5w Itching    Severe itching and flushing.    No orders of the defined types were placed in this encounter.   AUTHORIZATION INFORMATION Primary Insurance: 1D#: Group #:  Secondary Insurance: 1D#: Group #:  SCHEDULE INFORMATION: Date:  Time: Location:

## 2021-12-28 DIAGNOSIS — Z0000 Encounter for general adult medical examination without abnormal findings: Secondary | ICD-10-CM | POA: Insufficient documentation

## 2021-12-28 NOTE — Assessment & Plan Note (Signed)
Discussed diet exercise and continue spironolactone.  Labs discussed with patient.

## 2021-12-28 NOTE — Assessment & Plan Note (Signed)
Advance directive discussed with patient.  Husband designated if patient were incapacitated. 

## 2021-12-28 NOTE — Assessment & Plan Note (Signed)
Flu- she opted for lower dose.   Shingles discussed with patient PNA deferred today since she is getting her flu shot. Tetanus deferred. COVID-vaccine previously done. Colonoscopy 2019 Breast cancer screening discussed with patient. Bone density test discussed with patient. Advance directive discussed with patient.  Husband designated if patient were incapacitated. Cognitive function addressed- see scanned forms- and if abnormal then additional documentation follows.

## 2021-12-28 NOTE — Assessment & Plan Note (Signed)
Discussed with patient about diet exercise and encouraged routine statin start given coronary artery disease noted on previous CT.  She can consider.

## 2021-12-29 DIAGNOSIS — M1711 Unilateral primary osteoarthritis, right knee: Secondary | ICD-10-CM | POA: Diagnosis not present

## 2021-12-30 LAB — HM DIABETES EYE EXAM

## 2022-01-06 ENCOUNTER — Telehealth: Payer: Self-pay | Admitting: *Deleted

## 2022-01-06 NOTE — Telephone Encounter (Signed)
Patient called stating that her husband just found out he has COVID. To be safe, patient request to have the colonoscopy reschedule.  Procedure date was 01/19/2022 and we are rescheduling it to 02/25/2022. Called and notified of the change to [facility] End[facility] unit.  New instructions will be sent to patient.  Patient verbalized understanding.

## 2022-01-09 ENCOUNTER — Encounter: Payer: Self-pay | Admitting: Family Medicine

## 2022-02-25 ENCOUNTER — Encounter: Payer: Self-pay | Admitting: Gastroenterology

## 2022-02-25 ENCOUNTER — Ambulatory Visit: Payer: Medicare (Managed Care) | Admitting: Certified Registered"

## 2022-02-25 ENCOUNTER — Ambulatory Visit
Admission: RE | Admit: 2022-02-25 | Discharge: 2022-02-25 | Disposition: A | Discharge status: Home / Self Care | Payer: Medicare (Managed Care) | Attending: Gastroenterology | Admitting: Gastroenterology

## 2022-02-25 ENCOUNTER — Encounter
Admission: RE | Disposition: A | Discharge status: Home / Self Care | Payer: Self-pay | Source: Home / Self Care | Attending: Gastroenterology

## 2022-02-25 DIAGNOSIS — Z83719 Family history of colon polyps, unspecified: Secondary | ICD-10-CM | POA: Insufficient documentation

## 2022-02-25 DIAGNOSIS — K760 Fatty (change of) liver, not elsewhere classified: Secondary | ICD-10-CM | POA: Insufficient documentation

## 2022-02-25 DIAGNOSIS — K219 Gastro-esophageal reflux disease without esophagitis: Secondary | ICD-10-CM | POA: Insufficient documentation

## 2022-02-25 DIAGNOSIS — Z1211 Encounter for screening for malignant neoplasm of colon: Secondary | ICD-10-CM | POA: Insufficient documentation

## 2022-02-25 DIAGNOSIS — Z86010 Personal history of colonic polyps: Secondary | ICD-10-CM

## 2022-02-25 DIAGNOSIS — Z800 Family history of malignant neoplasm of digestive organs: Secondary | ICD-10-CM | POA: Diagnosis not present

## 2022-02-25 DIAGNOSIS — I10 Essential (primary) hypertension: Secondary | ICD-10-CM | POA: Diagnosis not present

## 2022-02-25 DIAGNOSIS — Z87891 Personal history of nicotine dependence: Secondary | ICD-10-CM | POA: Insufficient documentation

## 2022-02-25 DIAGNOSIS — E119 Type 2 diabetes mellitus without complications: Secondary | ICD-10-CM | POA: Diagnosis not present

## 2022-02-25 DIAGNOSIS — I2510 Atherosclerotic heart disease of native coronary artery without angina pectoris: Secondary | ICD-10-CM | POA: Insufficient documentation

## 2022-02-25 DIAGNOSIS — Z90710 Acquired absence of both cervix and uterus: Secondary | ICD-10-CM | POA: Diagnosis not present

## 2022-02-25 DIAGNOSIS — K5730 Diverticulosis of large intestine without perforation or abscess without bleeding: Secondary | ICD-10-CM | POA: Diagnosis not present

## 2022-02-25 DIAGNOSIS — D126 Benign neoplasm of colon, unspecified: Secondary | ICD-10-CM | POA: Diagnosis not present

## 2022-02-25 HISTORY — PX: COLONOSCOPY WITH PROPOFOL: SHX5780

## 2022-02-25 SURGERY — COLONOSCOPY WITH PROPOFOL
Anesthesia: General

## 2022-02-25 MED ORDER — LACTATED RINGERS IV SOLN: INTRAVENOUS | Status: DC | PRN

## 2022-02-25 MED ORDER — PROPOFOL 1000 MG/100ML IV EMUL
INTRAVENOUS | Status: AC
  Filled 2022-02-25: qty 100

## 2022-02-25 MED ORDER — PROPOFOL 10 MG/ML IV BOLUS
INTRAVENOUS | Status: DC | PRN
  Administered 2022-02-25 (×3): 20 mg via INTRAVENOUS

## 2022-02-25 MED ORDER — LIDOCAINE HCL (PF) 2 % IJ SOLN
INTRAMUSCULAR | Status: AC
  Filled 2022-02-25: qty 5

## 2022-02-25 MED ORDER — PROPOFOL 10 MG/ML IV BOLUS
INTRAVENOUS | Status: AC
  Filled 2022-02-25: qty 20

## 2022-02-25 MED ORDER — LIDOCAINE HCL (CARDIAC) PF 100 MG/5ML IV SOSY
PREFILLED_SYRINGE | INTRAVENOUS | Status: DC | PRN
  Administered 2022-02-25 (×2): 50 mg via INTRAVENOUS

## 2022-02-25 MED ORDER — PROPOFOL 500 MG/50ML IV EMUL
INTRAVENOUS | Status: DC | PRN
  Administered 2022-02-25: 120 ug/kg/min via INTRAVENOUS

## 2022-02-25 MED ORDER — SODIUM CHLORIDE 0.9 % IV SOLN: INTRAVENOUS | Status: DC

## 2022-02-25 NOTE — Anesthesia Postprocedure Evaluation (Signed)
Anesthesia Post Note  Patient: Alexandria Fry  Procedure(s) Performed: COLONOSCOPY WITH PROPOFOL  Patient location during evaluation: PACU Anesthesia Type: General Level of consciousness: awake and alert Pain management: pain level controlled Vital Signs Assessment: post-procedure vital signs reviewed and stable Respiratory status: spontaneous breathing, nonlabored ventilation, respiratory function stable and patient connected to nasal cannula oxygen Cardiovascular status: blood pressure returned to baseline and stable Postop Assessment: no apparent nausea or vomiting Anesthetic complications: no   No notable events documented.   Last Vitals:  Vitals:   02/25/22 0815 02/25/22 0825  BP: (!) 128/59 (!) 148/79  Pulse: 72 79  Resp: 17 (!) 22  Temp: (!) 36.1 C   SpO2: 100% 100%    Last Pain:  Vitals:   02/25/22 0825  TempSrc:   PainSc: 0-No pain                 Ilene Qua

## 2022-02-25 NOTE — Op Note (Signed)
[facility] Gastroenterology Patient Name: Alexandria Fry Procedure Date: 02/25/2022 7:55 AM MRN: [id number] Account #: [id number] Date of Birth: 1955/06/28 Admit Type: Outpatient Age: 67 Room: [facility] ENDO ROOM 3 Gender: Female Note Status: Finalized Instrument Name: Jasper Riling [id number] Procedure:             Colonoscopy Indications:           Screening in patient at increased risk: Family history                         of 1st-degree relative with colorectal cancer Providers:             Jonathon Bellows MD, MD Medicines:             Monitored Anesthesia Care Complications:         No immediate complications. Procedure:             Pre-Anesthesia Assessment:                        - Prior to the procedure, a History and Physical was                         performed, and patient medications, allergies and                         sensitivities were reviewed. The patient's tolerance                         of previous anesthesia was reviewed.                        - The risks and benefits of the procedure and the                         sedation options and risks were discussed with the                         patient. All questions were answered and informed                         consent was obtained.                        - ASA Grade Assessment: II - A patient with mild                         systemic disease.                        After obtaining informed consent, the colonoscope was                         passed under direct vision. Throughout the procedure,                         the patient's blood pressure, pulse, and oxygen                         saturations were monitored continuously. The  Colonoscope was introduced through the anus and                         advanced to the the cecum, identified by the                         appendiceal orifice. The colonoscopy was performed                         with ease. The patient  tolerated the procedure well.                         The quality of the bowel preparation was excellent.                         The ileocecal valve, appendiceal orifice, and rectum                         were photographed. Findings:      The perianal and digital rectal examinations were normal.      A 5 mm polyp was found in the ascending colon. The polyp was sessile.       The polyp was removed with a cold snare. Resection was complete, but the       polyp tissue was not retrieved.      The exam was otherwise without abnormality on direct and retroflexion       views.      Multiple small-mouthed diverticula were found in the sigmoid colon. Impression:            - One 5 mm polyp in the ascending colon, removed with                         a cold snare. Complete resection. Polyp tissue not                         retrieved.                        - The examination was otherwise normal on direct and                         retroflexion views.                        - Diverticulosis in the sigmoid colon. Recommendation:        - Discharge patient to home (with escort).                        - Resume previous diet.                        - Continue present medications.                        - Repeat colonoscopy in 5 years for surveillance. Procedure Code(s):     --- Professional ---                        [phone], Colonoscopy, flexible; with removal of  tumor(s), polyp(s), or other lesion(s) by snare                         technique Diagnosis Code(s):     --- Professional ---                        Z80.0, Family history of malignant neoplasm of                         digestive organs                        D12.2, Benign neoplasm of ascending colon                        K57.30, Diverticulosis of large intestine without                         perforation or abscess without bleeding CPT copyright 2022 American Medical Association. All rights reserved. The  codes documented in this report are preliminary and upon coder review may  be revised to meet current compliance requirements. Jonathon Bellows, MD Jonathon Bellows MD, MD 02/25/2022 8:15:48 AM This report has been signed electronically. Number of Addenda: 0 Note Initiated On: 02/25/2022 7:55 AM Scope Withdrawal Time: 0 hours 8 minutes 3 seconds  Total Procedure Duration: 0 hours 11 minutes 27 seconds  Estimated Blood Loss:  Estimated blood loss: none.      [facility]

## 2022-02-25 NOTE — Anesthesia Preprocedure Evaluation (Addendum)
Anesthesia Evaluation  Patient identified by MRN, date of birth, ID band Patient awake    Reviewed: Allergy & Precautions, NPO status , Patient's Chart, lab work & pertinent test results  History of Anesthesia Complications (+) PONV, DIFFICULT AIRWAY and history of anesthetic complications (Patient has hx of difficult airway, multiple failed intubation attempts 2/2 limited neck mobility and anterior airway. Successful LMA placement, easy mask)  Airway Mallampati: IV  TM Distance: >3 FB Neck ROM: Limited    Dental  (+) Missing, Caps, Dental Advisory Given   Pulmonary former smoker   Pulmonary exam normal        Cardiovascular hypertension, + CAD  Normal cardiovascular exam     Neuro/Psych negative neurological ROS  negative psych ROS   GI/Hepatic Neg liver ROS,GERD  ,,  Endo/Other  diabetes    Renal/GU   negative genitourinary   Musculoskeletal   Abdominal   Peds  Hematology negative hematology ROS (+)   Anesthesia Other Findings Patient reports she is unable to lie flat without difficulty breathing 2/2 a large goiter. She denies OSA.  Past Medical History: No date: Allergic rhinitis No date: Diabetes mellitus without complication (HCC) No date: Difficult intubation No date: Fatty liver No date: GERD (gastroesophageal reflux disease) No date: Hypertension No date: Irregular menses     Comment:  with neg endometrial biopsy 2013 No date: Menorrhagia No date: Murmur, cardiac No date: Pneumonia     Comment:  2014 No date: PONV (postoperative nausea and vomiting)  Past Surgical History: No date: ABDOMINAL HYSTERECTOMY No date: CERVICAL CONE BIOPSY 01/18/2018: COLONOSCOPY WITH PROPOFOL; N/A     Comment:  Procedure: COLONOSCOPY WITH PROPOFOL;  Surgeon:               Lollie Sails, MD;  Location: [facility] ENDOSCOPY;                Service: Endoscopy;  Laterality: N/A; 11/11/2015: CYSTOSCOPY     Comment:   Procedure: CYSTOSCOPY;  Surgeon: Honor Loh Ward, MD;                Location: [facility] ORS;  Service: Gynecology;; 03/21/2015: DILATATION & CURETTAGE/HYSTEROSCOPY WITH MYOSURE; N/A     Comment:  Procedure: DILATATION & CURETTAGE/HYSTEROSCOPY,               POLYPECTOMY;  Surgeon: Honor Loh Ward, MD;  Location:               [facility] ORS;  Service: Gynecology;  Laterality: N/A; 1981: DILATION AND CURETTAGE OF UTERUS     Comment:  [facility]  06/25/2009: DILATION AND CURETTAGE OF UTERUS     Comment:  normal (Dr. Laurey Morale) 11/11/2015: LAPAROSCOPIC BILATERAL SALPINGECTOMY; Bilateral     Comment:  Procedure: LAPAROSCOPIC BILATERAL SALPINGECTOMY;                Surgeon: Honor Loh Ward, MD;  Location: [facility] ORS;                Service: Gynecology;  Laterality: Bilateral; 11/11/2015: LAPAROSCOPIC SUPRACERVICAL HYSTERECTOMY     Comment:  Procedure: LAPAROSCOPIC SUPRACERVICAL HYSTERECTOMY               CONVERTED TO OPEN;  Surgeon: Honor Loh Ward, MD;                Location: [facility] ORS;  Service: Gynecology;; 11/11/2015: LAPAROTOMY     Comment:  Procedure: LAPAROTOMY;  Surgeon: Honor Loh Ward, MD;  Location: [facility] ORS;  Service: Gynecology;; No date: VAGINAL DELIVERY     Comment:  NSVD x 2     Reproductive/Obstetrics negative OB ROS                             Anesthesia Physical Anesthesia Plan  ASA: 3  Anesthesia Plan: General   Post-op Pain Management: Minimal or no pain anticipated   Induction: Intravenous  PONV Risk Score and Plan: Propofol infusion and TIVA  Airway Management Planned: LMA  Additional Equipment:   Intra-op Plan:   Post-operative Plan: Extubation in OR  Informed Consent: I have reviewed the patients History and Physical, chart, labs and discussed the procedure including the risks, benefits and alternatives for the proposed anesthesia with the patient or authorized representative who has indicated his/her understanding and acceptance.      Dental Advisory Given  Plan Discussed with: Anesthesiologist, CRNA and Surgeon  Anesthesia Plan Comments: (Patient consented for risks of anesthesia including but not limited to:  - adverse reactions to medications - damage to eyes, teeth, lips or other oral mucosa - nerve damage due to positioning  - sore throat or hoarseness - Damage to heart, brain, nerves, lungs, other parts of body or loss of life  Patient voiced understanding.)        Anesthesia Quick Evaluation

## 2022-02-25 NOTE — Transfer of Care (Signed)
Immediate Anesthesia Transfer of Care Note  Patient: Alexandria Fry  Procedure(s) Performed: COLONOSCOPY WITH PROPOFOL  Patient Location: PACU  Anesthesia Type:General  Level of Consciousness: awake, alert , oriented, and patient cooperative  Airway & Oxygen Therapy: Patient Spontanous Breathing and Patient connected to face mask oxygen  Post-op Assessment: Report given to RN and Post -op Vital signs reviewed and stable  Post vital signs: Reviewed and stable  Last Vitals:  Vitals Value Taken Time  BP    Temp    Pulse    Resp    SpO2      Last Pain:  Vitals:   02/25/22 0727  TempSrc: Temporal  PainSc: 0-No pain         Complications: No notable events documented.

## 2022-02-25 NOTE — H&P (Signed)
Jonathon Bellows, MD [address], [place], [place], [place], [address] [address], [place], [place], [place], [address] Phone: [phone]  Fax: [phone]  Primary Care Physician:  Tonia Ghent, MD   Pre-Procedure History & Physical: HPI:  ROSALITA CAREY is a 67 y.o. female is here for an colonoscopy.   Past Medical History:  Diagnosis Date   Allergic rhinitis    Diabetes mellitus without complication ([facility])    Difficult intubation    Fatty liver    GERD (gastroesophageal reflux disease)    Hypertension    Irregular menses    with neg endometrial biopsy 2013   Menorrhagia    Murmur, cardiac    Pneumonia    2014   PONV (postoperative nausea and vomiting)     Past Surgical History:  Procedure Laterality Date   ABDOMINAL HYSTERECTOMY     CERVICAL CONE BIOPSY     COLONOSCOPY WITH PROPOFOL N/A 01/18/2018   Procedure: COLONOSCOPY WITH PROPOFOL;  Surgeon: Lollie Sails, MD;  Location: [facility] ENDOSCOPY;  Service: Endoscopy;  Laterality: N/A;   CYSTOSCOPY  11/11/2015   Procedure: CYSTOSCOPY;  Surgeon: Honor Loh Ward, MD;  Location: [facility] ORS;  Service: Gynecology;;   DILATATION & CURETTAGE/HYSTEROSCOPY WITH MYOSURE N/A 03/21/2015   Procedure: DILATATION & CURETTAGE/HYSTEROSCOPY, POLYPECTOMY;  Surgeon: Honor Loh Ward, MD;  Location: [facility] ORS;  Service: Gynecology;  Laterality: N/A;   DILATION AND CURETTAGE OF UTERUS  1981   [facility]    DILATION AND CURETTAGE OF UTERUS  06/25/2009   normal (Dr. Laurey Morale)   LAPAROSCOPIC BILATERAL SALPINGECTOMY Bilateral 11/11/2015   Procedure: LAPAROSCOPIC BILATERAL SALPINGECTOMY;  Surgeon: Honor Loh Ward, MD;  Location: [facility] ORS;  Service: Gynecology;  Laterality: Bilateral;   LAPAROSCOPIC SUPRACERVICAL HYSTERECTOMY  11/11/2015   Procedure: LAPAROSCOPIC SUPRACERVICAL HYSTERECTOMY CONVERTED TO OPEN;  Surgeon: Honor Loh Ward, MD;  Location: [facility] ORS;  Service: Gynecology;;   LAPAROTOMY  11/11/2015   Procedure: LAPAROTOMY;  Surgeon:  Honor Loh Ward, MD;  Location: [facility] ORS;  Service: Gynecology;;   VAGINAL DELIVERY     NSVD x 2    Prior to Admission medications   Medication Sig Start Date End Date Taking? Authorizing Provider  Multiple Vitamin (MULTIVITAMIN) tablet Take 1 tablet by mouth daily.   Yes [provider]  spironolactone (ALDACTONE) 50 MG tablet TAKE 1 TABLET(50 MG) BY MOUTH DAILY 12/25/21  Yes Tonia Ghent, MD  VITAMIN D, CHOLECALCIFEROL, PO Take 4,000 Units by mouth.   Yes [provider]  pravastatin (PRAVACHOL) 10 MG tablet Take 1 tablet (10 mg total) by mouth daily. 12/25/21   Tonia Ghent, MD    Allergies as of 12/26/2021 - Review Complete 12/25/2021  Allergen Reaction Noted   Amlodipine Other (See Comments) and Swelling 03/23/2016   Azithromycin Rash 09/27/2012   Clavulanic acid Swelling 04/26/2019   Hydrochlorothiazide Other (See Comments) 11/04/2015   Other Swelling 04/17/2019   Penicillins Other (See Comments) 11/04/2015   Sulfa antibiotics Other (See Comments) 11/04/2015   Ace inhibitors Other (See Comments) 01/29/2012   Albuterol  05/18/2021   Angiotensin receptor blockers Other (See Comments) 03/29/2017   Clonidine derivatives Other (See Comments) 04/08/2017   Diphenhydramine hcl     Fentanyl Other (See Comments) 03/23/2016   Hydrochlorothiazide w-triamterene     Hydrocodone  03/31/2018   Metformin and related  01/29/2012   Metoprolol  06/01/2019   Nitrofurantoin Other (See Comments)    Oxycodone Other (See Comments) 11/04/2015   Tetanus  toxoids  03/31/2018   Cefuroxime axetil Rash 12/21/2011   Levofloxacin in d5w Itching 10/03/2012    Family History  Problem Relation Age of Onset   Cancer Mother 49       breast with a mastectomy that was hormone receptor  and rectal cancer at age 25 with a protectomy, ileostomy and colectomy, multiple polyps   Diabetes Mother    Hypertension Mother    Colon cancer Mother    Breast cancer Mother 19   Osteoporosis  Mother    Cancer Father        pancreatic   Colon polyps Father    Alcohol abuse Father    Heart disease Father    Early death Sister 75       suicide   Thyroid disease Neg Hx     Social History   Socioeconomic History   Marital status: Married    Spouse name: Not on file   Number of children: 2   Years of education: Not on file   Highest education level: Not on file  Occupational History    Employer: [employer]    Comment: Buyer  Tobacco Use   Smoking status: Former    Packs/day: 1.00    Years: 10.00    Total pack years: 10.00    Types: Cigarettes    Quit date: 01/20/1982    Years since quitting: 40.1   Smokeless tobacco: Never   Tobacco comments:    quit for 28 years  Vaping Use   Vaping Use: Never used  Substance and Sexual Activity   Alcohol use: Yes   Drug use: No   Sexual activity: Yes  Other Topics Concern   Not on file  Social History Narrative   Lives with husband, 43   1 son at home   1 son out of the house   Social Determinants of Health   Financial Resource Strain: Not on file  Food Insecurity: Not on file  Transportation Needs: Not on file  Physical Activity: Not on file  Stress: Not on file  Social Connections: Not on file  Intimate Partner Violence: Not on file    Review of Systems: See HPI, otherwise negative ROS  Physical Exam: BP (!) 171/82   Pulse 85   Temp (!) 96.6 F (35.9 C) (Temporal)   Resp 18   Ht '5\' 5"'$  (1.651 m)   Wt 92.7 kg   LMP  (LMP Unknown)   SpO2 100%   BMI 34.02 kg/m  General:   Alert,  pleasant and cooperative in NAD Head:  Normocephalic and atraumatic. Neck:  Supple; no masses or thyromegaly. Lungs:  Clear throughout to auscultation, normal respiratory effort.    Heart:  +S1, +S2, Regular rate and rhythm, No edema. Abdomen:  Soft, nontender and nondistended. Normal bowel sounds, without guarding, and without rebound.   Neurologic:  Alert and  oriented x4;  grossly normal  neurologically.  Impression/Plan: OMER MONTER is here for an colonoscopy to be performed for family history of colon cancer Risks, benefits, limitations, and alternatives regarding  colonoscopy have been reviewed with the patient.  Questions have been answered.  All parties agreeable.   Jonathon Bellows, MD  02/25/2022, 7:50 AM

## 2022-02-26 ENCOUNTER — Encounter: Payer: Self-pay | Admitting: Gastroenterology

## 2022-03-20 IMAGING — MG MM DIGITAL SCREENING BILAT W/ TOMO AND CAD
6 of 10 series · 6 of 30 positions shown · non-contrast
Comparison: Previous exam(s).

CLINICAL DATA: Screening.

EXAM:
DIGITAL SCREENING BILATERAL MAMMOGRAM WITH TOMOSYNTHESIS AND CAD
TECHNIQUE: Bilateral screening digital craniocaudal and mediolateral oblique
mammograms were obtained. Bilateral screening digital breast
tomosynthesis was performed. The images were evaluated with
computer-aided detection.

[R CC synth-2D]
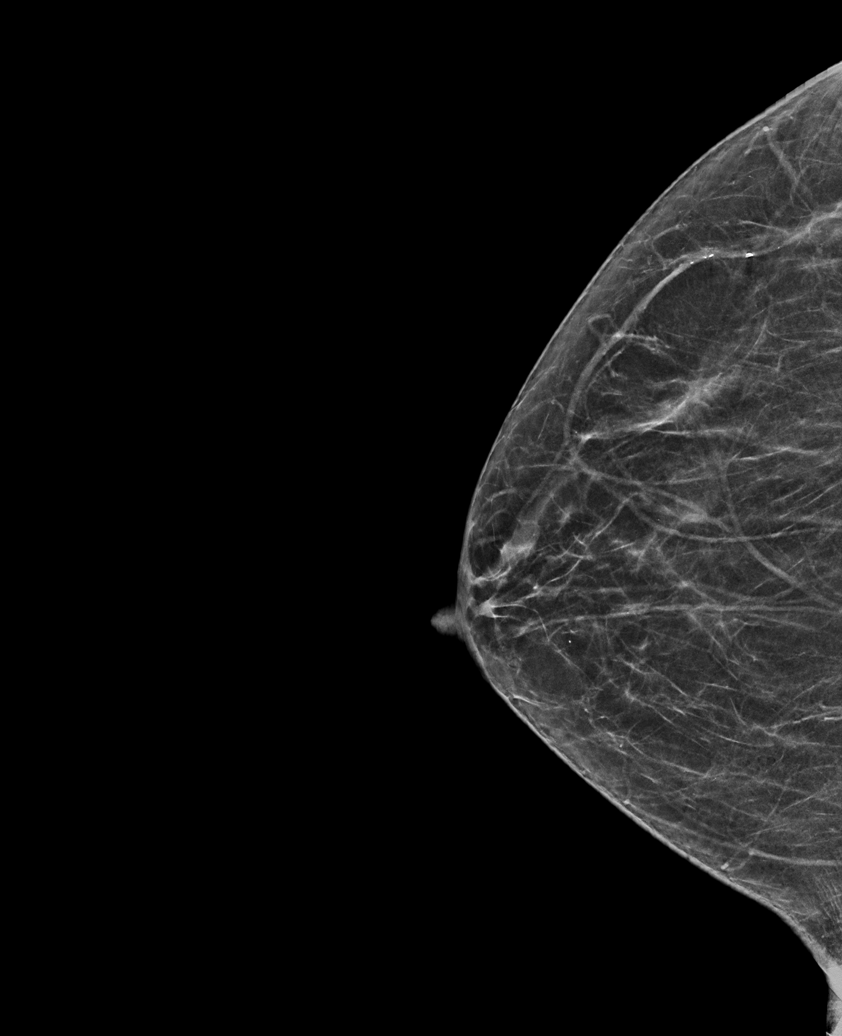

[R MLO synth-2D (1 of 2)]
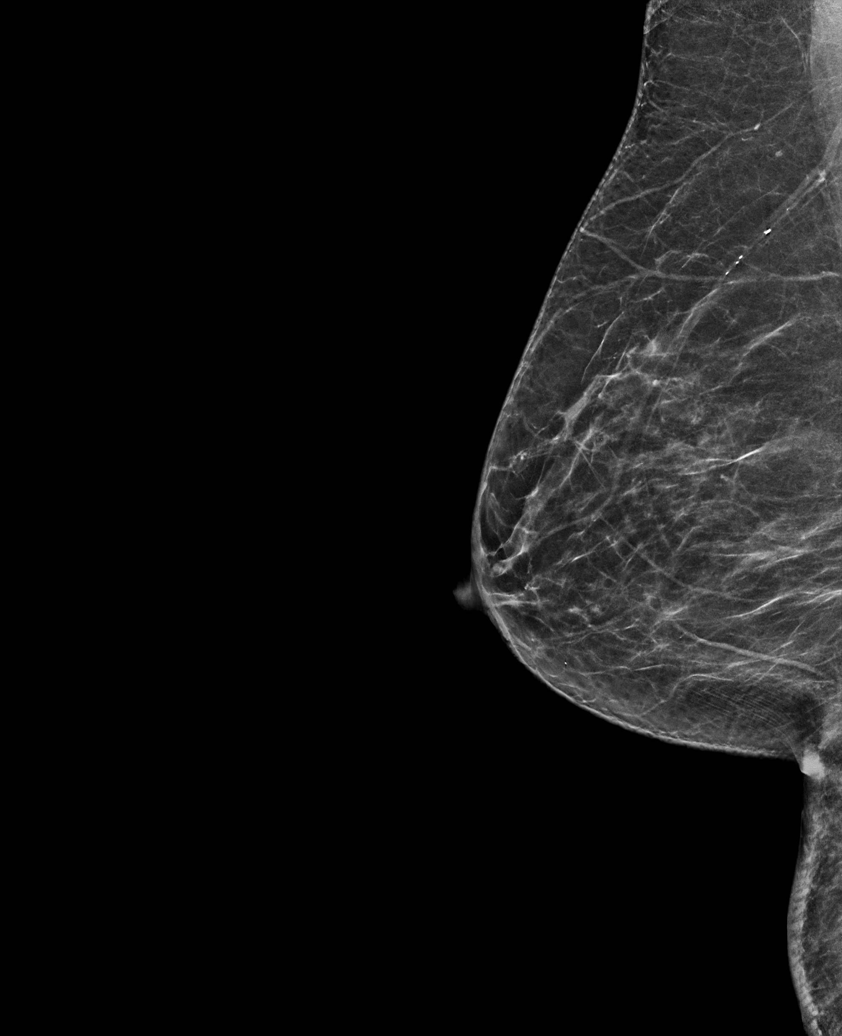

[L CC synth-2D]
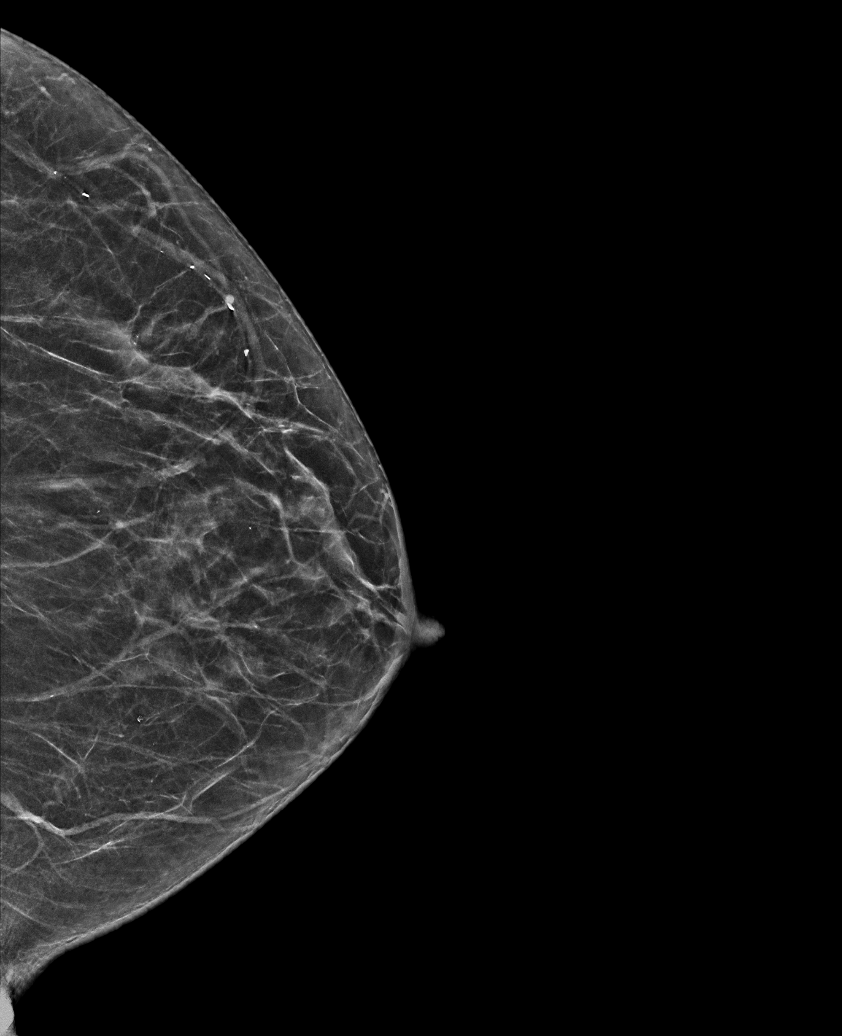

[R MLO synth-2D (2 of 2)]
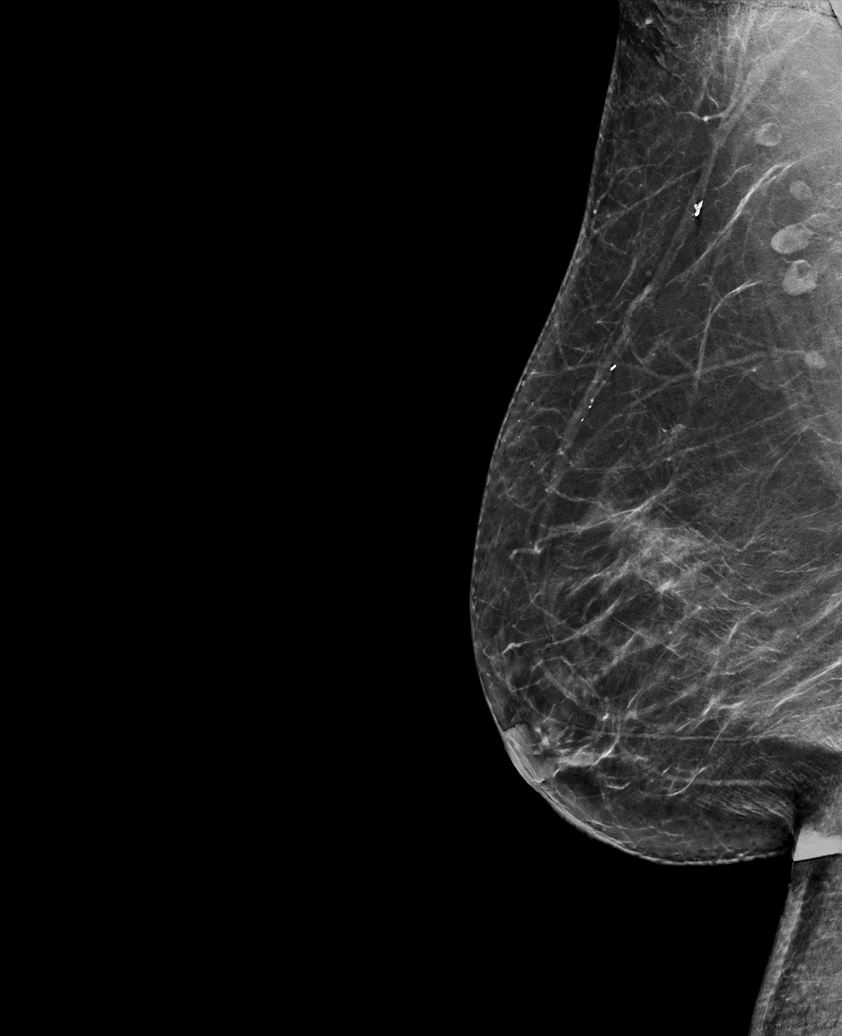

[L MLO synth-2D]
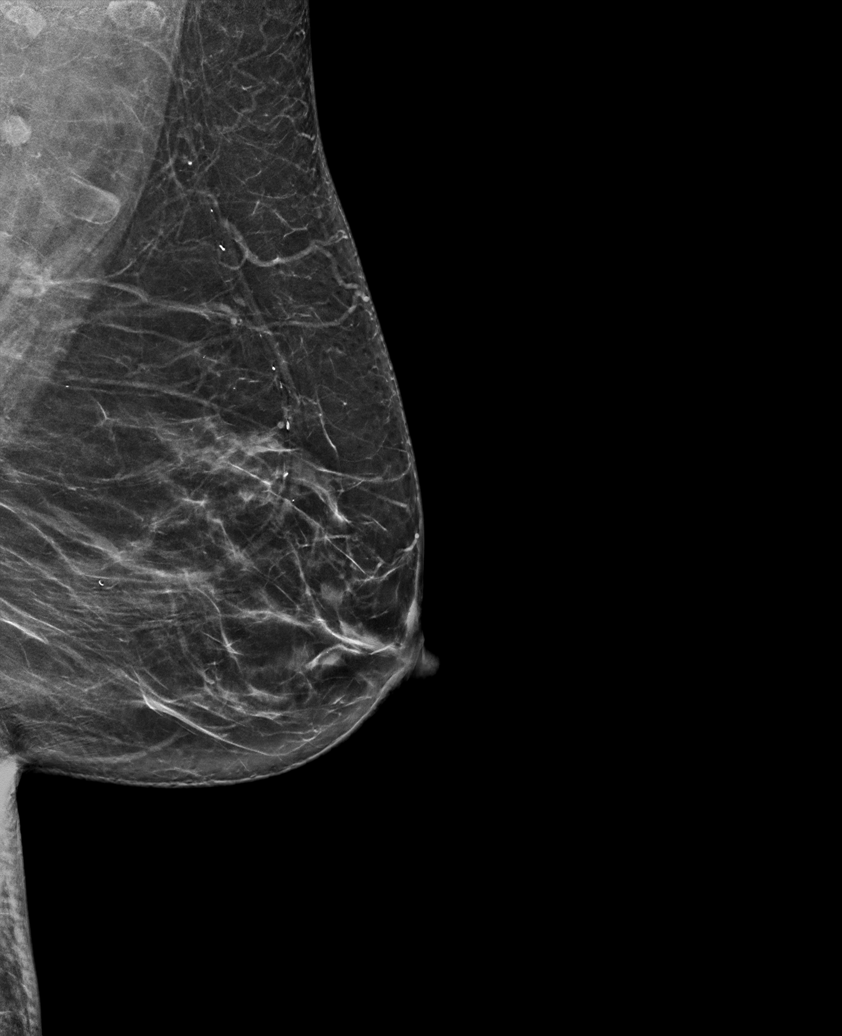

[L CC tomo · tomo slice 29/58.0]
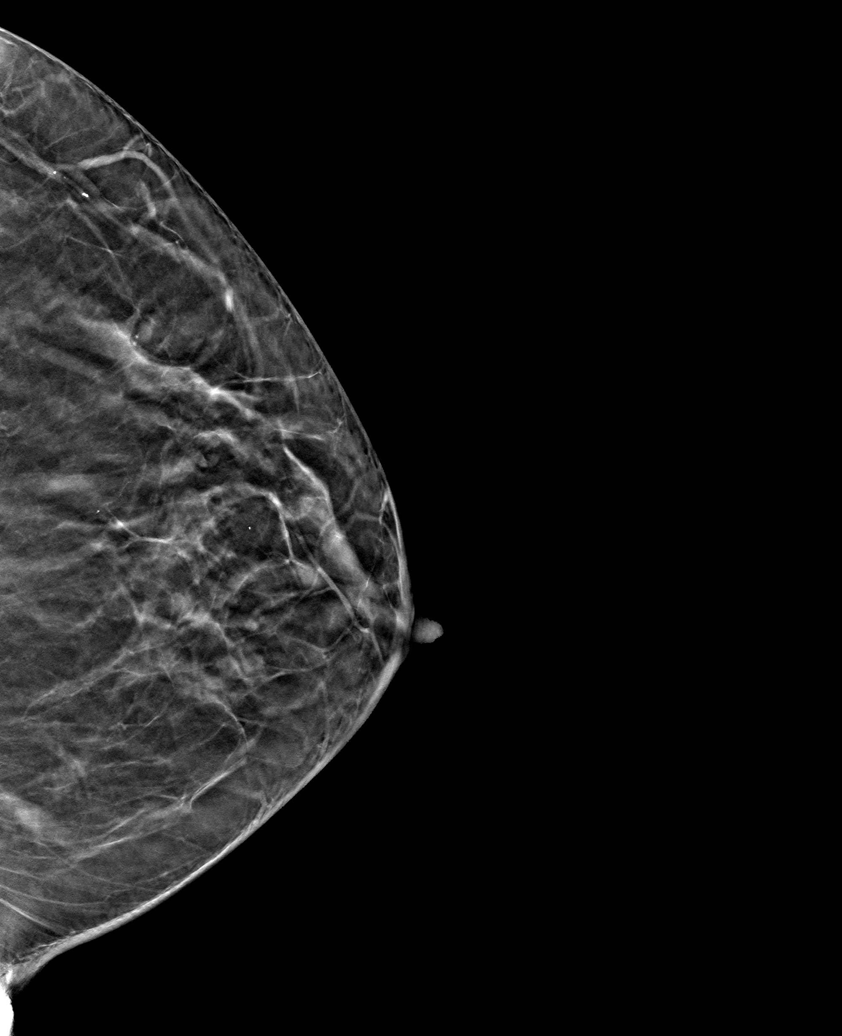

[6 of 30 positions shown; findings below may reference images not displayed]

ACR Breast Density Category b: There are scattered areas of
fibroglandular density.
FINDINGS: In the left breast, a possible mass warrants further evaluation. In
the right breast, no findings suspicious for malignancy.
IMPRESSION: Further evaluation is suggested for a possible mass in the left
breast.

RECOMMENDATION:
Diagnostic mammogram and possibly ultrasound of the left breast.
(Code:VD-X-44M)

The patient will be contacted regarding the findings, and additional
imaging will be scheduled.

BI-RADS CATEGORY  0: Incomplete. Need additional imaging evaluation
and/or prior mammograms for comparison.

## 2022-03-27 IMAGING — MG MM DIGITAL DIAGNOSTIC UNILAT*L* W/ TOMO W/ CAD
4 series · 4 of 12 positions shown · non-contrast
Comparison: Previous exam(s).

CLINICAL DATA: Callback for LEFT breast mass

EXAM:
DIGITAL DIAGNOSTIC UNILATERAL LEFT MAMMOGRAM WITH TOMOSYNTHESIS AND
CAD; ULTRASOUND LEFT BREAST LIMITED
TECHNIQUE: Left digital diagnostic mammography and breast tomosynthesis was
performed. The images were evaluated with computer-aided detection.;
Targeted ultrasound examination of the left breast was performed.

[L CC synth-2D]
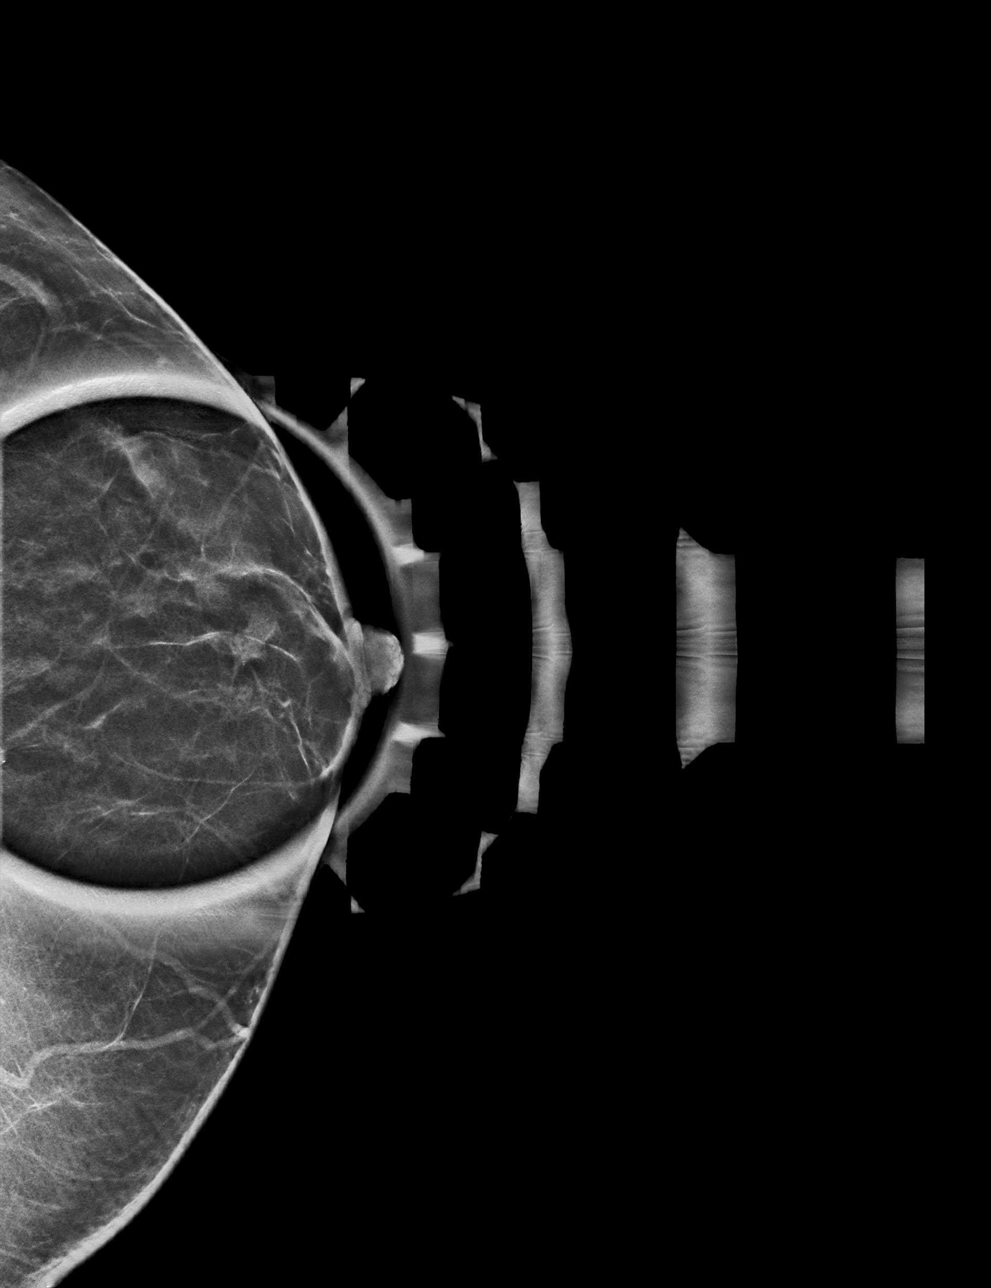

[L MLO synth-2D]
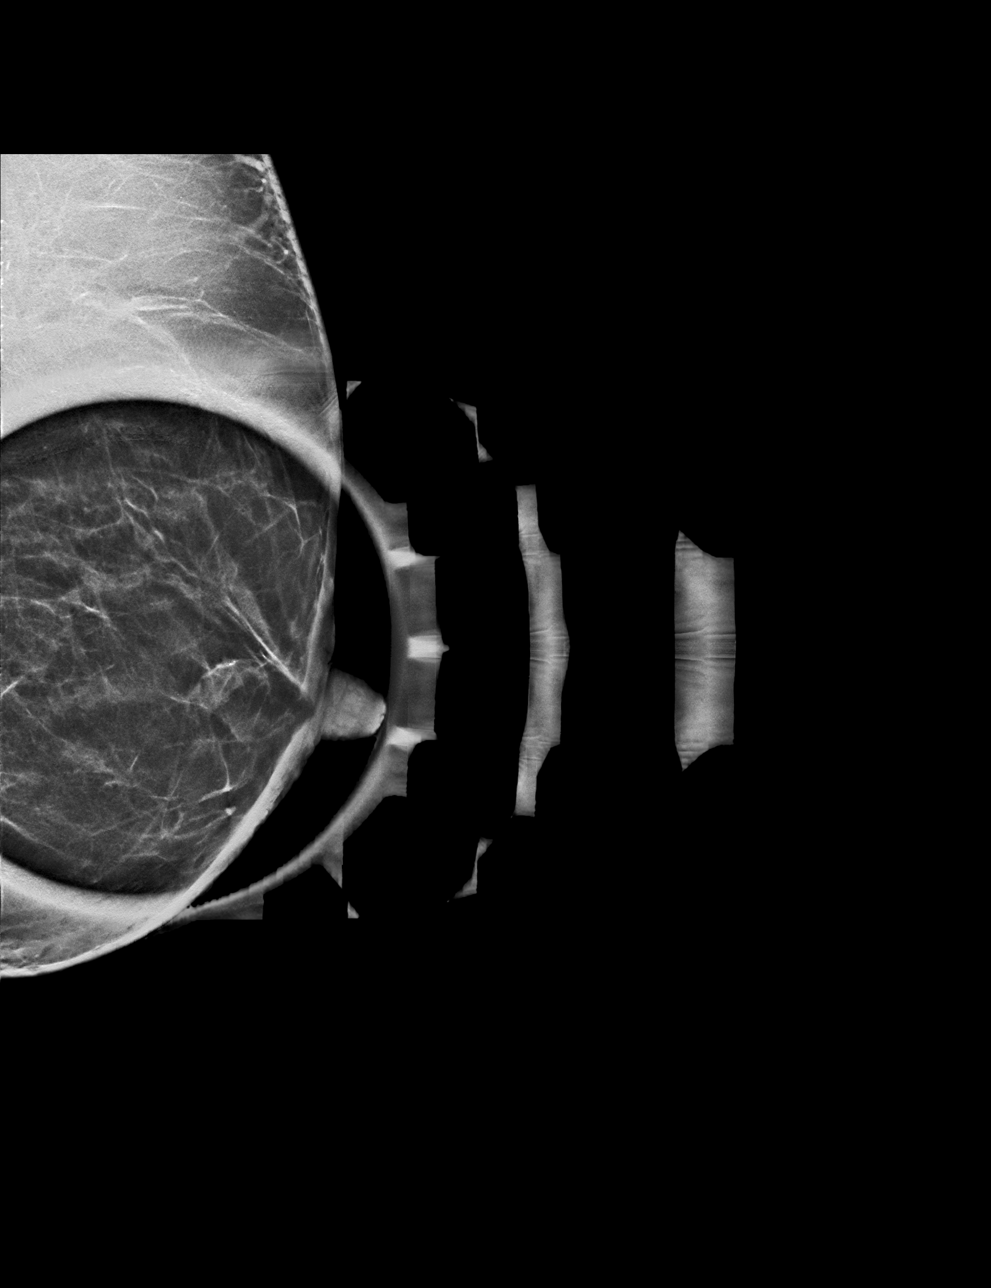

[L MLO tomo · tomo slice 23/45.0]
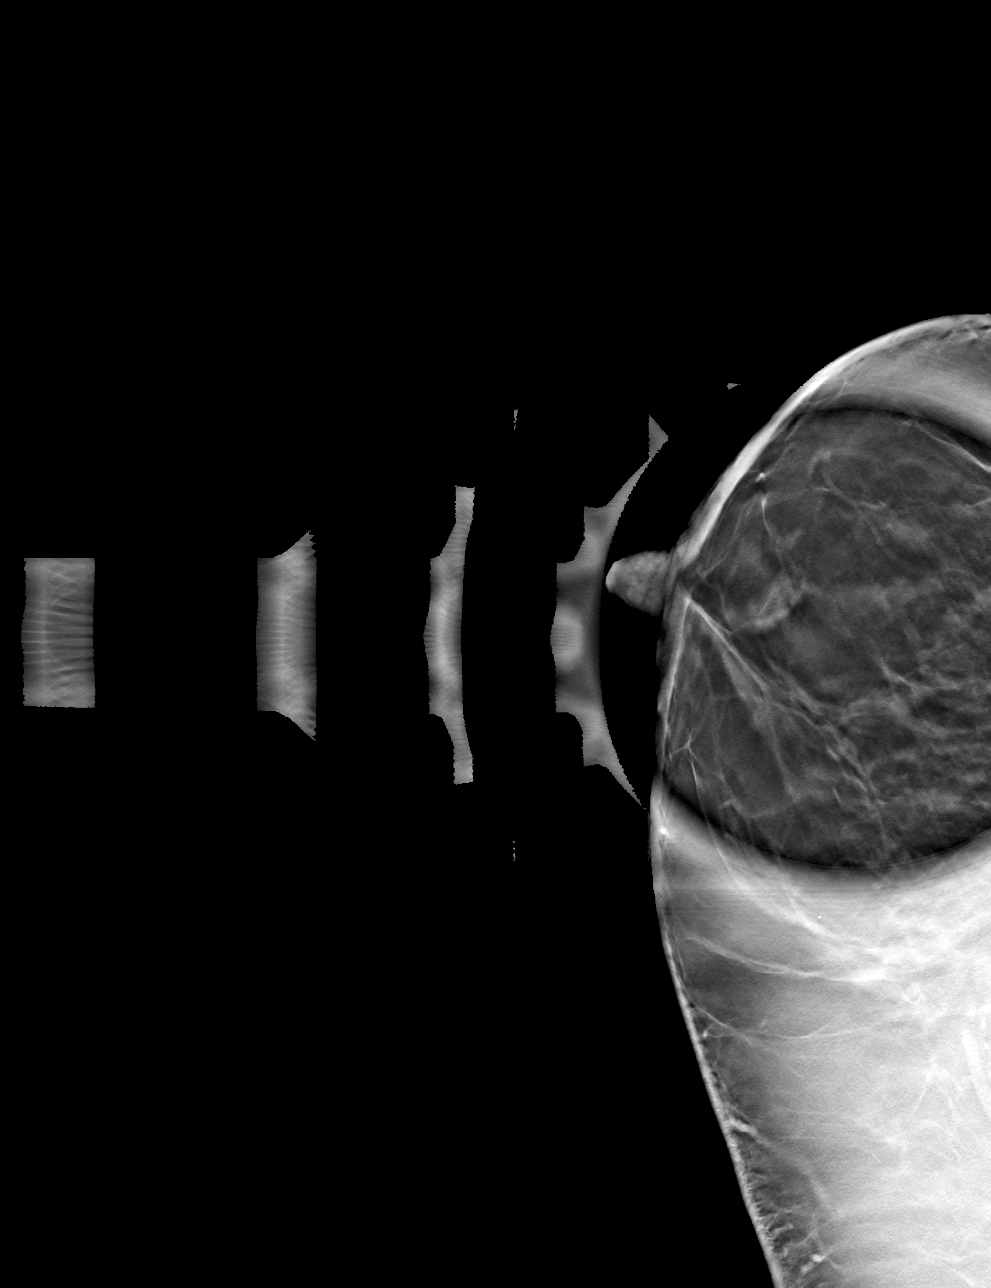

[L CC tomo · tomo slice 21/42.0]
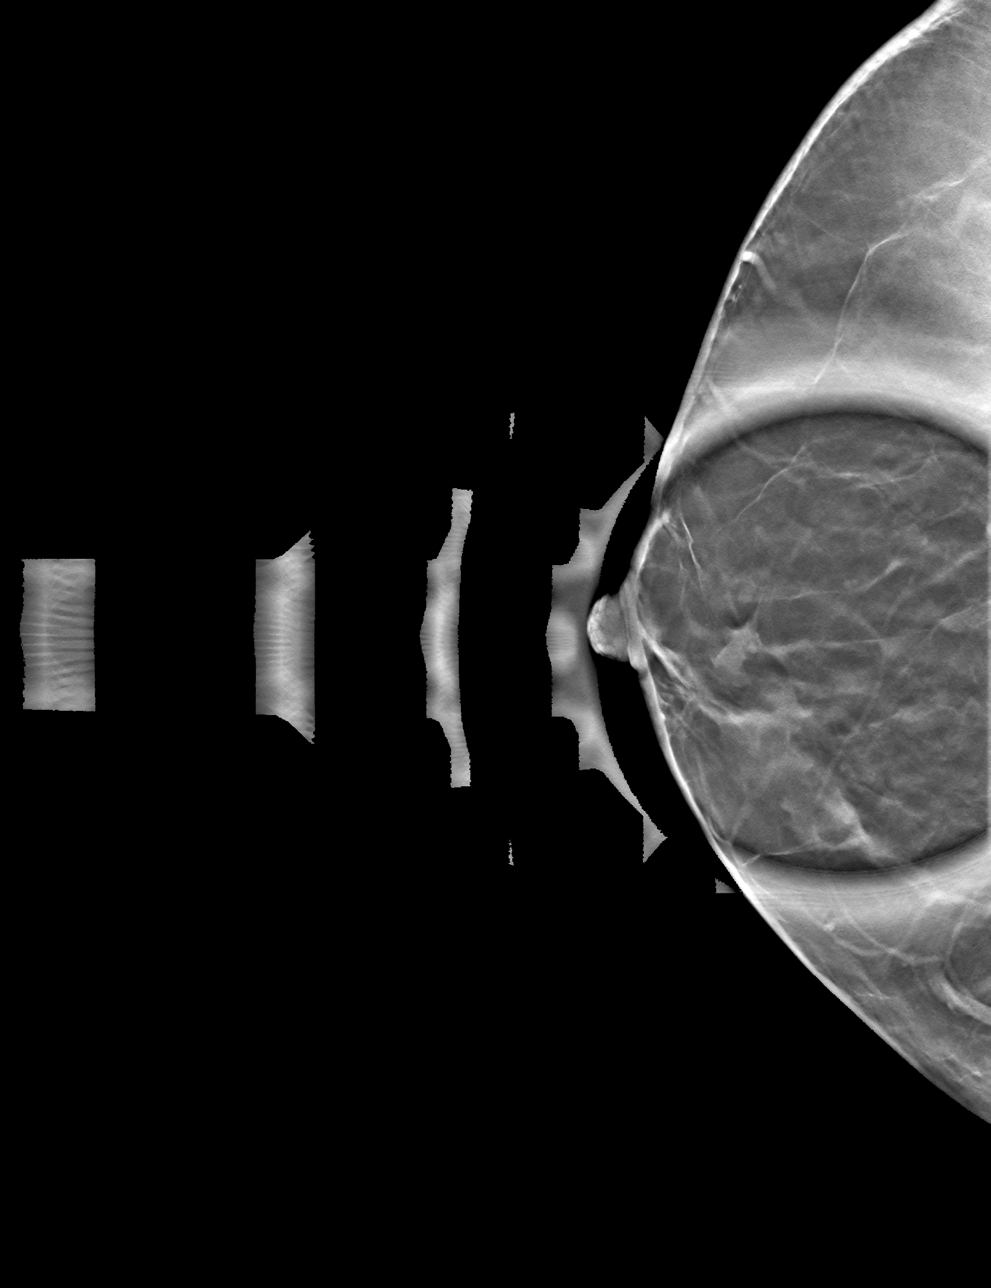

[4 of 12 positions shown; findings below may reference images not displayed]

ACR Breast Density Category b: There are scattered areas of
fibroglandular density.
FINDINGS: The previously described finding does not persist with additional
views, consistent with superimposed fibroglandular tissue.
Fibroglandular tissue assumes a configuration stable in comparison
to remote prior mammograms. No suspicious mass, microcalcification,
or other finding is identified. Given breast density at this
location, targeted ultrasound was performed.

On physical exam, no suspicious mass is appreciated.

Targeted LEFT breast ultrasound was performed in the upper
retroareolar breast. No suspicious cystic or solid mass is seen at
the site of screening mammographic concern.
IMPRESSION: 1. No mammographic or sonographic evidence of malignancy at the site
of screening mammographic concern.

RECOMMENDATION:
Screening mammogram in one year.(Code:22-Z-FOZ)

I have discussed the findings and recommendations with the patient.
If applicable, a reminder letter will be sent to the patient
regarding the next appointment.

BI-RADS CATEGORY  1: Negative.

## 2022-06-22 DIAGNOSIS — M25561 Pain in right knee: Secondary | ICD-10-CM | POA: Diagnosis not present

## 2022-09-28 ENCOUNTER — Other Ambulatory Visit: Payer: Self-pay | Admitting: Family Medicine

## 2022-09-28 DIAGNOSIS — Z1231 Encounter for screening mammogram for malignant neoplasm of breast: Secondary | ICD-10-CM

## 2022-10-08 ENCOUNTER — Ambulatory Visit
Admission: RE | Admit: 2022-10-08 | Discharge: 2022-10-08 | Disposition: A | Discharge status: Home / Self Care | Payer: Medicare (Managed Care) | Source: Ambulatory Visit | Attending: Family Medicine | Admitting: Family Medicine

## 2022-10-08 DIAGNOSIS — Z1231 Encounter for screening mammogram for malignant neoplasm of breast: Secondary | ICD-10-CM | POA: Insufficient documentation

## 2022-12-22 ENCOUNTER — Ambulatory Visit (INDEPENDENT_AMBULATORY_CARE_PROVIDER_SITE_OTHER): Payer: Medicare (Managed Care)

## 2022-12-22 VITALS — Ht 65.0 in | Wt 204.0 lb

## 2022-12-22 DIAGNOSIS — Z0000 Encounter for general adult medical examination without abnormal findings: Secondary | ICD-10-CM | POA: Diagnosis not present

## 2022-12-22 DIAGNOSIS — Z13820 Encounter for screening for osteoporosis: Secondary | ICD-10-CM

## 2022-12-22 NOTE — Patient Instructions (Addendum)
Alexandria Fry , Thank you for taking time to come for your Medicare Wellness Visit. I appreciate your ongoing commitment to your health goals. Please review the following plan we discussed and let me know if I can assist you in the future.   Referrals/Orders/Follow-Ups/Clinician Recommendations:   You have an order for:  []   2D Mammogram  []   3D Mammogram  [x]   Bone Density     Please call for appointment:   [facility] [address] [place], [place] [address] [phone]  [facility] [address]. Geanie Logan [place], [place] [address] [phone]   [facility] [address] [place], [place] [address] [phone]  [facility] [address] [place], [place] [address] [phone]  [facility] [place] [address] [place], [place] [address] [phone]  [facility] [place] [facility] [address] [place], [place] [address] [phone]  [facility] [address] [place], [place] [address] [phone]    Make sure to wear two-piece clothing.  No lotions, powders, or deodorants the day of the appointment. Make sure to bring picture ID and insurance card.  Bring list of medications you are currently taking including any supplements.   Schedule your [facility] screening mammogram through MyChart!   Log into your MyChart account.  Go to 'Visit' (or 'Appointments' if on mobile App) --> Schedule an Appointment  Under 'Select a Reason for Visit' choose the Mammogram Screening option.  Complete the pre-visit questions and select the time and place that best fits your schedule.    This is a list of the screening recommended for you and due dates:  Health Maintenance  Topic Date Due   Zoster (Shingles) Vaccine (1 of 2) Never done   DTaP/Tdap/Td vaccine (3 - Tdap) 01/17/2020    DEXA scan (bone density measurement)  Never done   COVID-19 Vaccine (4 - 2023-24 season) 10/18/2022   Pneumonia Vaccine (2 of 2 - PCV) 12/22/2023*   Medicare Annual Wellness Visit  12/22/2023   Mammogram  10/07/2024   Colon Cancer Screening  02/26/2027   Flu Shot  Completed   Hepatitis C Screening  Completed   HPV Vaccine  Aged Out  *Topic was postponed. The date shown is not the original due date.    Advanced directives: (ACP Link)Information on Advanced Care Planning can be found at [facility] Directives Advance Health Care Directives ([email or web address]   Next Medicare Annual Wellness Visit scheduled for next year: Yes 12/23/2023 @ 10:10am telephone

## 2022-12-22 NOTE — Progress Notes (Signed)
Subjective:   Alexandria Fry is a 67 y.o. female who presents for an Initial Medicare Annual Wellness Visit.  Visit Complete: Virtual I connected with  Alexandria Fry on 12/22/22 by a audio enabled telemedicine application and verified that I am speaking with the correct person using two identifiers.  Patient Location: Home  Provider Location: Office/Clinic  I discussed the limitations of evaluation and management by telemedicine. The patient expressed understanding and agreed to proceed.  Vital Signs: Because this visit was a virtual/telehealth visit, some criteria may be missing or patient reported. Any vitals not documented were not able to be obtained and vitals that have been documented are patient reported.  Patient Medicare AWV questionnaire was completed by the patient on 12/21/22; I have confirmed that all information answered by patient is correct and no changes since this date.     Objective:    Today's Vitals   12/22/22 0948  Weight: 204 lb (92.5 kg)  Height: 5\' 5"  (1.651 m)   Body mass index is 33.95 kg/m.     12/22/2022   10:00 AM 02/25/2022    7:24 AM 05/06/2021    9:21 PM 05/06/2021    3:22 PM 01/18/2018   12:38 PM 11/11/2015    4:00 PM 11/07/2015    9:39 AM  Advanced Directives  Does Patient Have a Medical Advance Directive? No No No No No No No  Would patient like information on creating a medical advance directive?   No - Patient declined        Current Medications (verified) Outpatient Encounter Medications as of 12/22/2022  Medication Sig   Multiple Vitamin (MULTIVITAMIN) tablet Take 1 tablet by mouth daily.   spironolactone (ALDACTONE) 50 MG tablet TAKE 1 TABLET(50 MG) BY MOUTH DAILY   VITAMIN D, CHOLECALCIFEROL, PO Take 4,000 Units by mouth.   pravastatin (PRAVACHOL) 10 MG tablet Take 1 tablet (10 mg total) by mouth daily.   No facility-administered encounter medications on file as of 12/22/2022.    Allergies (verified) Amlodipine,  Azithromycin, Clavulanic acid, Hydrochlorothiazide, Other, Penicillins, Pravachol [pravastatin], Sulfa antibiotics, Ace inhibitors, Albuterol, Angiotensin receptor blockers, Clonidine derivatives, Diphenhydramine hcl, Fentanyl, Hydrochlorothiazide w-triamterene, Hydrocodone, Metformin and related, Metoprolol, Nitrofurantoin, Oxycodone, Tetanus toxoids, Cefuroxime axetil, and Levofloxacin in d5w   History: Past Medical History:  Diagnosis Date   Allergic rhinitis    Diabetes mellitus without complication (HCC)    Difficult intubation    Fatty liver    GERD (gastroesophageal reflux disease)    Hypertension    Irregular menses    with neg endometrial biopsy 2013   Menorrhagia    Murmur, cardiac    Pneumonia    2014   PONV (postoperative nausea and vomiting)    Past Surgical History:  Procedure Laterality Date   ABDOMINAL HYSTERECTOMY     CERVICAL CONE BIOPSY     COLONOSCOPY WITH PROPOFOL N/A 01/18/2018   Procedure: COLONOSCOPY WITH PROPOFOL;  Surgeon: Christena Deem, MD;  Location: [facility] ENDOSCOPY;  Service: Endoscopy;  Laterality: N/A;   COLONOSCOPY WITH PROPOFOL N/A 02/25/2022   Procedure: COLONOSCOPY WITH PROPOFOL;  Surgeon: Wyline Mood, MD;  Location: [facility] ENDOSCOPY;  Service: Gastroenterology;  Laterality: N/A;   CYSTOSCOPY  11/11/2015   Procedure: CYSTOSCOPY;  Surgeon: Elenora Fender Ward, MD;  Location: [facility] ORS;  Service: Gynecology;;   DILATATION & CURETTAGE/HYSTEROSCOPY WITH MYOSURE N/A 03/21/2015   Procedure: DILATATION & CURETTAGE/HYSTEROSCOPY, POLYPECTOMY;  Surgeon: Elenora Fender Ward, MD;  Location: [facility] ORS;  Service: Gynecology;  Laterality: N/A;  DILATION AND CURETTAGE OF UTERUS  1981   [facility]    DILATION AND CURETTAGE OF UTERUS  06/25/2009   normal (Dr. Luella Cook)   LAPAROSCOPIC BILATERAL SALPINGECTOMY Bilateral 11/11/2015   Procedure: LAPAROSCOPIC BILATERAL SALPINGECTOMY;  Surgeon: Elenora Fender Ward, MD;  Location: [facility] ORS;  Service: Gynecology;  Laterality: Bilateral;    LAPAROSCOPIC SUPRACERVICAL HYSTERECTOMY  11/11/2015   Procedure: LAPAROSCOPIC SUPRACERVICAL HYSTERECTOMY CONVERTED TO OPEN;  Surgeon: Elenora Fender Ward, MD;  Location: [facility] ORS;  Service: Gynecology;;   LAPAROTOMY  11/11/2015   Procedure: LAPAROTOMY;  Surgeon: Elenora Fender Ward, MD;  Location: [facility] ORS;  Service: Gynecology;;   VAGINAL DELIVERY     NSVD x 2   Family History  Problem Relation Age of Onset   Cancer Mother 41       breast with a mastectomy that was hormone receptor  and rectal cancer at age 4 with a protectomy, ileostomy and colectomy, multiple polyps   Diabetes Mother    Hypertension Mother    Colon cancer Mother    Breast cancer Mother 72   Osteoporosis Mother    Cancer Father        pancreatic   Colon polyps Father    Alcohol abuse Father    Heart disease Father    Early death Sister 52       suicide   Thyroid disease Neg Hx    Social History   Socioeconomic History   Marital status: Married    Spouse name: Not on file   Number of children: 2   Years of education: Not on file   Highest education level: Not on file  Occupational History    Employer: [employer]    Comment: Buyer  Tobacco Use   Smoking status: Former    Current packs/day: 0.00    Average packs/day: 1 pack/day for 10.0 years (10.0 ttl pk-yrs)    Types: Cigarettes    Start date: 01/21/1972    Quit date: 01/20/1982    Years since quitting: 40.9   Smokeless tobacco: Never   Tobacco comments:    quit for 28 years  Vaping Use   Vaping status: Never Used  Substance and Sexual Activity   Alcohol use: Yes   Drug use: No   Sexual activity: Yes  Other Topics Concern   Not on file  Social History Narrative   Lives with husband, 66   1 son at home   1 son out of the house   Social Determinants of Health   Financial Resource Strain: Low Risk  (12/21/2022)   Overall Financial Resource Strain (CARDIA)    Difficulty of Paying Living Expenses: Not hard at all  Food Insecurity: No Food  Insecurity (12/21/2022)   Hunger Vital Sign    Worried About Running Out of Food in the Last Year: Never true    Ran Out of Food in the Last Year: Never true  Transportation Needs: No Transportation Needs (12/21/2022)   PRAPARE - Administrator, Civil Service (Medical): No    Lack of Transportation (Non-Medical): No  Physical Activity: Insufficiently Active (12/21/2022)   Exercise Vital Sign    Days of Exercise per Week: 2 days    Minutes of Exercise per Session: 30 min  Stress: No Stress Concern Present (12/21/2022)   [employer] of Occupational Health - Occupational Stress Questionnaire    Feeling of Stress : Only a little  Social Connections: Unknown (12/21/2022)   Social Connection and Isolation Panel [NHANES]  Frequency of Communication with Friends and Family: More than three times a week    Frequency of Social Gatherings with Friends and Family: Once a week    Attends Religious Services: Not on Marketing executive or Organizations: No    Attends Engineer, structural: Not on file    Marital Status: Married    Tobacco Counseling Counseling given: Not Answered Tobacco comments: quit for 28 years   Clinical Intake:  Pre-visit preparation completed: No  Pain : No/denies pain     BMI - recorded: 33.95 Nutritional Status: BMI > 30  Obese Nutritional Risks: None Diabetes: No  How often do you need to have someone help you when you read instructions, pamphlets, or other written materials from your doctor or pharmacy?: 1 - Never  Interpreter Needed?: No  Comments: lives with husband son at home Information entered by :: B.Ayda Tancredi,LPN   Activities of Daily Living    12/21/2022    1:16 PM  In your present state of health, do you have any difficulty performing the following activities:  Hearing? 0  Vision? 0  Difficulty concentrating or making decisions? 0  Walking or climbing stairs? 0  Dressing or bathing? 0  Doing errands,  shopping? 0  Preparing Food and eating ? N  Using the Toilet? N  In the past six months, have you accidently leaked urine? N  Do you have problems with loss of bowel control? N  Managing your Medications? N  Managing your Finances? N  Housekeeping or managing your Housekeeping? N    Patient Care Team: Joaquim Nam, MD as PCP - General (Family Medicine)  Indicate any recent Medical Services you may have received from other than [facility] providers in the past year (date may be approximate).     Assessment:   This is a routine wellness examination for Chari.  Hearing/Vision screen Hearing Screening - Comments:: Pt says her hearing is okay right now;has tinnitus Vision Screening - Comments:: Pt says she her vision is adequate with glasses [facility]   Goals Addressed               This Visit's Progress     Patient Stated (pt-stated)        I want to lose some more weight:by exercising and limits carbs and sugar in general       Depression Screen    12/22/2022    9:57 AM 12/25/2021   10:12 AM 05/16/2020    2:48 PM 03/29/2017    3:29 PM  PHQ 2/9 Scores  PHQ - 2 Score 0 0 0 0  PHQ- 9 Score   2     Fall Risk    12/21/2022    1:16 PM  Fall Risk   Falls in the past year? 0  Risk for fall due to : No Fall Risks  Follow up Education provided;Falls prevention discussed    MEDICARE RISK AT HOME: Medicare Risk at Home Any stairs in or around the home?: Yes If so, are there any without handrails?: Yes Home free of loose throw rugs in walkways, pet beds, electrical cords, etc?: Yes Adequate lighting in your home to reduce risk of falls?: Yes Life alert?: No Use of a cane, walker or w/c?: No Grab bars in the bathroom?: No Shower chair or bench in shower?: Yes Elevated toilet seat or a handicapped toilet?: No  TIMED UP AND GO:  Was the test performed? No  Cognitive Function:        12/22/2022   10:11 AM  6CIT Screen  What Year? 0 points  What month?  0 points  What time? 0 points  Count back from 20 0 points  Months in reverse 0 points  Repeat phrase 0 points  Total Score 0 points   Immunizations Immunization History  Administered Date(s) Administered   Influenza Inj Mdck Quad Pf 11/23/2017   Influenza Split 01/29/2011, 12/04/2011   Influenza Whole 11/23/2007, 11/27/2009   Influenza, Seasonal, Injecte, Preservative Fre 12/03/2014   Influenza,inj,Quad PF,6+ Mos 12/16/2012, 11/12/2015, 11/27/2016, 10/27/2018, 11/16/2019, 12/25/2021   Influenza-Unspecified 12/07/2013, 11/23/2017, 11/29/2020, 11/22/2022   PFIZER(Purple Top)SARS-COV-2 Vaccination 05/14/2019, 06/06/2019, 12/18/2019   Pneumococcal Polysaccharide-23 10/19/2012   Td 08/17/1998, 01/16/2010    TDAP status: Up to date  Flu Vaccine status: Up to date  Pneumococcal vaccine status: Due, Education has been provided regarding the importance of this vaccine. Advised may receive this vaccine at local pharmacy or Health Dept. Aware to provide a copy of the vaccination record if obtained from local pharmacy or Health Dept. Verbalized acceptance and understanding. Will get at [facility]  Covid-19 vaccine status: Completed vaccines  Qualifies for Shingles Vaccine? Yes   Zostavax completed No   Shingrix Completed?: No.    Education has been provided regarding the importance of this vaccine. Patient has been advised to call insurance company to determine out of pocket expense if they have not yet received this vaccine. Advised may also receive vaccine at local pharmacy or Health Dept. Verbalized acceptance and understanding.  Screening Tests Health Maintenance  Topic Date Due   Zoster Vaccines- Shingrix (1 of 2) Never done   DTaP/Tdap/Td (3 - Tdap) 01/17/2020   DEXA SCAN  Never done   COVID-19 Vaccine (4 - 2023-24 season) 10/18/2022   Pneumonia Vaccine 72+ Years old (2 of 2 - PCV) 12/22/2023 (Originally 05/07/2021)   Medicare Annual Wellness (AWV)  12/22/2023   MAMMOGRAM  10/07/2024    Colonoscopy  02/26/2027   INFLUENZA VACCINE  Completed   Hepatitis C Screening  Completed   HPV VACCINES  Aged Out    Health Maintenance  Health Maintenance Due  Topic Date Due   Zoster Vaccines- Shingrix (1 of 2) Never done   DTaP/Tdap/Td (3 - Tdap) 01/17/2020   DEXA SCAN  Never done   COVID-19 Vaccine (4 - 2023-24 season) 10/18/2022    Colorectal cancer screening: Type of screening: Colonoscopy. Completed 02/25/2022. Repeat every 5 years  Mammogram status: Completed 10/08/22. Repeat every year  Bone Density status: Ordered yes. Pt provided with contact info and advised to call to schedule appt.  Lung Cancer Screening: (Low Dose CT Chest recommended if Age 70-80 years, 20 pack-year currently smoking OR have quit w/in 15years.) does not qualify.   Lung Cancer Screening Referral: no  Additional Screening:  Hepatitis C Screening: does not qualify; Completed 03/08/2015  Vision Screening: Recommended annual ophthalmology exams for early detection of glaucoma and other disorders of the eye. Is the patient up to date with their annual eye exam?  Yes  Who is the provider or what is the name of the office in which the patient attends annual eye exams? [facility] If pt is not established with a provider, would they like to be referred to a provider to establish care? No .   Dental Screening: Recommended annual dental exams for proper oral hygiene  Diabetic Foot Exam: n/a  Community Resource Referral / Chronic Care Management: CRR required this visit?  No   CCM required this visit?  No     Plan:     I have personally reviewed and noted the following in the patient's chart:   Medical and social history Use of alcohol, tobacco or illicit drugs  Current medications and supplements including opioid prescriptions. Patient is not currently taking opioid prescriptions. Functional ability and status Nutritional status Physical activity Advanced directives List of other  physicians Hospitalizations, surgeries, and ER visits in previous 12 months Vitals Screenings to include cognitive, depression, and falls Referrals and appointments  In addition, I have reviewed and discussed with patient certain preventive protocols, quality metrics, and best practice recommendations. A written personalized care plan for preventive services as well as general preventive health recommendations were provided to patient.     Sue Lush, LPN   16/02/958   After Visit Summary: (MyChart) Due to this being a telephonic visit, the after visit summary with patients personalized plan was offered to patient via MyChart   Nurse Notes: The patient states she is doing well and has no concerns or questions at this time.

## 2022-12-24 ENCOUNTER — Telehealth: Payer: Self-pay

## 2022-12-24 NOTE — Telephone Encounter (Signed)
Requested Diabetic eye exam from [facility].

## 2022-12-30 ENCOUNTER — Telehealth: Payer: Self-pay

## 2022-12-30 DIAGNOSIS — R739 Hyperglycemia, unspecified: Secondary | ICD-10-CM

## 2022-12-30 DIAGNOSIS — E559 Vitamin D deficiency, unspecified: Secondary | ICD-10-CM

## 2022-12-30 DIAGNOSIS — I10 Essential (primary) hypertension: Secondary | ICD-10-CM

## 2022-12-30 NOTE — Telephone Encounter (Signed)
Working on a list to close care gaps.  Pt is coming in for lab appointment 01/12/23.  Please enter lab orders for urine ACR, A1c and CMP/BMP along with other needed items.  Thank you so much!

## 2022-12-30 NOTE — Addendum Note (Signed)
Addended by: Joaquim Nam on: 12/30/2022 01:00 PM   Modules accepted: Orders

## 2022-12-30 NOTE — Telephone Encounter (Signed)
Thank you for your note.  Please update her care gap requirements since she is not diabetic.  I put in the routine orders.  Thanks.

## 2023-01-01 DIAGNOSIS — H18513 Endothelial corneal dystrophy, bilateral: Secondary | ICD-10-CM | POA: Diagnosis not present

## 2023-01-01 DIAGNOSIS — H524 Presbyopia: Secondary | ICD-10-CM | POA: Diagnosis not present

## 2023-01-01 DIAGNOSIS — H5213 Myopia, bilateral: Secondary | ICD-10-CM | POA: Diagnosis not present

## 2023-01-01 DIAGNOSIS — H18523 Epithelial (juvenile) corneal dystrophy, bilateral: Secondary | ICD-10-CM | POA: Diagnosis not present

## 2023-01-01 DIAGNOSIS — H1045 Other chronic allergic conjunctivitis: Secondary | ICD-10-CM | POA: Diagnosis not present

## 2023-01-01 DIAGNOSIS — H52213 Irregular astigmatism, bilateral: Secondary | ICD-10-CM | POA: Diagnosis not present

## 2023-01-01 LAB — HM DIABETES EYE EXAM

## 2023-01-12 ENCOUNTER — Other Ambulatory Visit: Payer: Medicare (Managed Care)

## 2023-01-19 ENCOUNTER — Encounter: Payer: Medicare (Managed Care) | Admitting: Family Medicine

## 2023-01-26 ENCOUNTER — Other Ambulatory Visit (INDEPENDENT_AMBULATORY_CARE_PROVIDER_SITE_OTHER): Payer: Medicare (Managed Care)

## 2023-01-26 DIAGNOSIS — R739 Hyperglycemia, unspecified: Secondary | ICD-10-CM

## 2023-01-26 DIAGNOSIS — I10 Essential (primary) hypertension: Secondary | ICD-10-CM

## 2023-01-26 DIAGNOSIS — E559 Vitamin D deficiency, unspecified: Secondary | ICD-10-CM

## 2023-01-26 LAB — CBC WITH DIFFERENTIAL/PLATELET
Basophils Absolute: 0.1 10*3/uL (ref 0.0–0.1)
Basophils Relative: 1.3 % (ref 0.0–3.0)
Eosinophils Absolute: 0.1 10*3/uL (ref 0.0–0.7)
Eosinophils Relative: 1.1 % (ref 0.0–5.0)
HCT: 46.1 % — ABNORMAL HIGH (ref 36.0–46.0)
Hemoglobin: 15.1 g/dL — ABNORMAL HIGH (ref 12.0–15.0)
Lymphocytes Relative: 12.2 % (ref 12.0–46.0)
Lymphs Abs: 1 10*3/uL (ref 0.7–4.0)
MCHC: 32.8 g/dL (ref 30.0–36.0)
MCV: 98.7 fL (ref 78.0–100.0)
Monocytes Absolute: 0.7 10*3/uL (ref 0.1–1.0)
Monocytes Relative: 8.2 % (ref 3.0–12.0)
Neutro Abs: 6.5 10*3/uL (ref 1.4–7.7)
Neutrophils Relative %: 77.2 % — ABNORMAL HIGH (ref 43.0–77.0)
Platelets: 214 10*3/uL (ref 150.0–400.0)
RBC: 4.67 Mil/uL (ref 3.87–5.11)
RDW: 12.8 % (ref 11.5–15.5)
WBC: 8.4 10*3/uL (ref 4.0–10.5)

## 2023-01-26 LAB — COMPREHENSIVE METABOLIC PANEL
ALT: 21 U/L (ref 0–35)
AST: 22 U/L (ref 0–37)
Albumin: 4.3 g/dL (ref 3.5–5.2)
Alkaline Phosphatase: 59 U/L (ref 39–117)
BUN: 19 mg/dL (ref 6–23)
CO2: 28 meq/L (ref 19–32)
Calcium: 9.9 mg/dL (ref 8.4–10.5)
Chloride: 101 meq/L (ref 96–112)
Creatinine, Ser: 1.16 mg/dL (ref 0.40–1.20)
GFR: 49.06 mL/min — ABNORMAL LOW (ref >60.00)
Glucose, Bld: 161 mg/dL — ABNORMAL HIGH (ref 70–99)
Potassium: 4.3 meq/L (ref 3.5–5.1)
Sodium: 137 meq/L (ref 135–145)
Total Bilirubin: 0.7 mg/dL (ref 0.2–1.2)
Total Protein: 7.2 g/dL (ref 6.0–8.3)

## 2023-01-26 LAB — LIPID PANEL
Cholesterol: 257 mg/dL — ABNORMAL HIGH (ref 0–200)
HDL: 42.3 mg/dL (ref >39.00)
LDL Cholesterol: 148 mg/dL — ABNORMAL HIGH (ref 0–99)
NonHDL: 214.87
Total CHOL/HDL Ratio: 6
Triglycerides: 336 mg/dL — ABNORMAL HIGH (ref 0.0–149.0)
VLDL: 67.2 mg/dL — ABNORMAL HIGH (ref 0.0–40.0)

## 2023-01-26 LAB — HEMOGLOBIN A1C: Hgb A1c MFr Bld: 6.9 % — ABNORMAL HIGH (ref 4.6–6.5)

## 2023-01-26 LAB — TSH: TSH: 1.62 u[IU]/mL (ref 0.35–5.50)

## 2023-01-26 LAB — VITAMIN D 25 HYDROXY (VIT D DEFICIENCY, FRACTURES): VITD: 41.6 ng/mL (ref 30.00–100.00)

## 2023-02-02 ENCOUNTER — Encounter: Payer: Medicare (Managed Care) | Admitting: Family Medicine

## 2023-02-16 ENCOUNTER — Encounter: Payer: Self-pay | Admitting: Family Medicine

## 2023-02-18 ENCOUNTER — Encounter: Payer: Medicare (Managed Care) | Admitting: Family Medicine

## 2023-02-18 ENCOUNTER — Ambulatory Visit (INDEPENDENT_AMBULATORY_CARE_PROVIDER_SITE_OTHER): Payer: Medicare (Managed Care) | Admitting: Family Medicine

## 2023-02-18 ENCOUNTER — Other Ambulatory Visit: Payer: Self-pay | Admitting: Family Medicine

## 2023-02-18 ENCOUNTER — Encounter: Payer: Self-pay | Admitting: *Deleted

## 2023-02-18 ENCOUNTER — Encounter: Payer: Self-pay | Admitting: Family Medicine

## 2023-02-18 VITALS — BP 154/82 | HR 93 | Temp 98.5°F | Ht 65.75 in | Wt 215.2 lb

## 2023-02-18 DIAGNOSIS — E042 Nontoxic multinodular goiter: Secondary | ICD-10-CM | POA: Diagnosis not present

## 2023-02-18 DIAGNOSIS — R300 Dysuria: Secondary | ICD-10-CM

## 2023-02-18 DIAGNOSIS — E049 Nontoxic goiter, unspecified: Secondary | ICD-10-CM

## 2023-02-18 DIAGNOSIS — Z0000 Encounter for general adult medical examination without abnormal findings: Secondary | ICD-10-CM

## 2023-02-18 DIAGNOSIS — E785 Hyperlipidemia, unspecified: Secondary | ICD-10-CM

## 2023-02-18 DIAGNOSIS — R21 Rash and other nonspecific skin eruption: Secondary | ICD-10-CM

## 2023-02-18 DIAGNOSIS — I10 Essential (primary) hypertension: Secondary | ICD-10-CM

## 2023-02-18 DIAGNOSIS — Z789 Other specified health status: Secondary | ICD-10-CM | POA: Diagnosis not present

## 2023-02-18 DIAGNOSIS — Z7189 Other specified counseling: Secondary | ICD-10-CM

## 2023-02-18 DIAGNOSIS — L659 Nonscarring hair loss, unspecified: Secondary | ICD-10-CM

## 2023-02-18 DIAGNOSIS — E119 Type 2 diabetes mellitus without complications: Secondary | ICD-10-CM

## 2023-02-18 MED ORDER — PRAMOXINE-HC 1-2.5 % EX CREA: TOPICAL_CREAM | Freq: Three times a day (TID) | CUTANEOUS | 3 refills | Status: DC | PRN

## 2023-02-18 MED ORDER — B-12 3000 MCG PO CAPS: 1.0000 | ORAL_CAPSULE | Freq: Every day | ORAL | Status: AC

## 2023-02-18 MED ORDER — TRIAMCINOLONE ACETONIDE 0.1 % EX CREA: 1.0000 | TOPICAL_CREAM | Freq: Two times a day (BID) | CUTANEOUS | 3 refills | Status: DC | PRN

## 2023-02-18 MED ORDER — SPIRONOLACTONE 50 MG PO TABS: ORAL_TABLET | ORAL | 3 refills | Status: DC

## 2023-02-18 NOTE — Patient Instructions (Addendum)
 You can call for a  bone density test at [facility].  [address] [place] [id number]  You should get a call about the thyroid  ultrasound.   Recheck A1c at a visit in about 3 months.  Take care.  Glad to see you.

## 2023-02-18 NOTE — Progress Notes (Signed)
 Hypertension:    Using medication without problems or lightheadedness: yes Chest pain with exertion:no Edema:no Short of breath: no Average home AEd:ontzm BP noted at home.    She had recently noted change in odor with urination w/o fevers.    Diabetes:  No meds.  Hypoglycemic episodes: no sx Hyperglycemic episodes: no sx Feet problems: no Blood Sugars averaging: not checked.   eye exam within last year: yes, [facility].   D/w pt about diet and exercise.   Statin intolerant.  Thyroid  area enlargement. Not an acute change.    Flu- 2024  Shingles discussed with patient PNA 2024 Tetanus deferred COVID-vaccine previously done. Colonoscopy 2024 Mammogram 2024 Bone density test scheduling d/w pt.  Advance directive discussed with patient.  Husband designated if patient were incapacitated.  She needed refill on pramoxine hydrocortisone  cream for as needed use of hemorrhoids.  Refill sent.  Need refill on triamcinolone  for rash on her elbows.  Meds, vitals, and allergies reviewed.   PMH and SH reviewed  ROS: Per HPI unless specifically indicated in ROS section   GEN: nad, alert and oriented HEENT: ncat NECK: supple w/o LA but thyroid  enlargement noted.  No stridor. CV: rrr. PULM: ctab, no inc wob ABD: soft, +bs EXT: no edema SKIN: well perfused.  Bilateral patch of irritated skin at the olecranon.  Diabetic foot exam: Normal inspection No skin breakdown No calluses  Normal DP pulses Normal sensation to light touch and monofilament Nails normal  40 minutes were devoted to patient care in this encounter (this includes time spent reviewing the patient's file/history, interviewing and examining the patient, counseling/reviewing plan with patient).

## 2023-02-19 LAB — URINALYSIS, ROUTINE W REFLEX MICROSCOPIC
Bilirubin Urine: NEGATIVE
Ketones, ur: NEGATIVE
Nitrite: POSITIVE — AB
Specific Gravity, Urine: 1.02 (ref 1.000–1.030)
Total Protein, Urine: NEGATIVE
Urine Glucose: NEGATIVE
Urobilinogen, UA: 0.2 (ref 0.0–1.0)
pH: 6 (ref 5.0–8.0)

## 2023-02-19 NOTE — Telephone Encounter (Signed)
 Did they suggest alternative?  Is so, please let me know.  O/w please start PA.  Thanks.

## 2023-02-19 NOTE — Telephone Encounter (Signed)
 It appears that the pharmacy is asking for an alternative or a prior [place]

## 2023-02-19 NOTE — Telephone Encounter (Signed)
 No specific alternative was requested on notice. Please start PA as requested.

## 2023-02-20 LAB — URINE CULTURE
MICRO NUMBER:: 15910167
SPECIMEN QUALITY:: ADEQUATE

## 2023-02-21 ENCOUNTER — Other Ambulatory Visit: Payer: Self-pay | Admitting: Family Medicine

## 2023-02-21 DIAGNOSIS — R21 Rash and other nonspecific skin eruption: Secondary | ICD-10-CM | POA: Insufficient documentation

## 2023-02-21 DIAGNOSIS — R300 Dysuria: Secondary | ICD-10-CM | POA: Insufficient documentation

## 2023-02-21 DIAGNOSIS — Z0000 Encounter for general adult medical examination without abnormal findings: Secondary | ICD-10-CM | POA: Insufficient documentation

## 2023-02-21 DIAGNOSIS — Z789 Other specified health status: Secondary | ICD-10-CM | POA: Insufficient documentation

## 2023-02-21 MED ORDER — FOSFOMYCIN TROMETHAMINE 3 G PO PACK: 3.0000 g | PACK | Freq: Once | ORAL | 0 refills | Status: AC

## 2023-02-21 NOTE — Assessment & Plan Note (Signed)
 Recheck A1c at a visit in about 3 months.  No change in medication.  Continue work on diet and exercise.

## 2023-02-21 NOTE — Assessment & Plan Note (Signed)
 History of.  Ultrasound ordered.

## 2023-02-21 NOTE — Assessment & Plan Note (Signed)
 Flu- 2024  Shingles discussed with patient PNA 2024 Tetanus deferred COVID-vaccine previously done. Colonoscopy 2024 Mammogram 2024 Bone density test scheduling d/w pt.  Advance directive discussed with patient.  Husband designated if patient were incapacitated.

## 2023-02-21 NOTE — Assessment & Plan Note (Signed)
 See notes on labs.

## 2023-02-21 NOTE — Assessment & Plan Note (Signed)
Can use triamcinolone and update me as needed.

## 2023-02-21 NOTE — Assessment & Plan Note (Signed)
 Continue spironolactone.  Continue work on diet and exercise.

## 2023-02-21 NOTE — Assessment & Plan Note (Signed)
Advance directive discussed with patient.  Husband designated if patient were incapacitated. 

## 2023-02-21 NOTE — Assessment & Plan Note (Signed)
 Statin intolerant

## 2023-02-21 NOTE — Assessment & Plan Note (Signed)
 Continue work on diet and exercise. Statin intolerant.

## 2023-02-22 ENCOUNTER — Encounter: Payer: Self-pay | Admitting: Family Medicine

## 2023-02-22 ENCOUNTER — Telehealth: Payer: Self-pay

## 2023-02-22 ENCOUNTER — Other Ambulatory Visit (HOSPITAL_COMMUNITY): Payer: Self-pay

## 2023-02-22 ENCOUNTER — Other Ambulatory Visit: Payer: Medicare (Managed Care)

## 2023-02-22 NOTE — Telephone Encounter (Signed)
 Requested.

## 2023-02-22 NOTE — Telephone Encounter (Signed)
-----   Message from Crawford Givens sent at 02/21/2023  8:21 PM EST ----- Please request her eye exam from [facility], if not already requested.  Thanks.

## 2023-02-22 NOTE — Telephone Encounter (Signed)
 Per test claim:  Hydrocortisone  2.5% rectal cream (Proctozone -HC),  is preferred by the insurance.  If suggested medication is appropriate, Please send in a new RX and discontinue this one. If not, please advise as to why it's not appropriate so that we may request a Prior Authorization. Please note, some preferred medications may still require a PA  Per test claim: The current 14 day co-pay for Hydrocortisone  2.5% rectal cream is $8.10.   This test claim was processed through [facility]- copay amounts may vary at other pharmacies due to pharmacy/plan contracts, or as the patient moves through the different stages of their insurance plan.

## 2023-02-22 NOTE — Telephone Encounter (Signed)
 Pharmacy Patient Advocate Encounter   Received notification from RX Request Messages that prior authorization for Hydrocortisone  Ace-Pramoxine 2.5-1 % Cream is required/requested.   Insurance verification completed.   The patient is insured through [employer] .   Per test claim:  Hydrocortisone  rectal cream (2.5%) is preferred by the insurance.  If suggested medication is appropriate, Please send in a new RX and discontinue this one. If not, please advise as to why it's not appropriate so that we may request a Prior Authorization. Please note, some preferred medications may still require a PA  Responded in original request

## 2023-02-23 MED ORDER — HYDROCORTISONE (PERIANAL) 2.5 % EX CREA: 1.0000 | TOPICAL_CREAM | Freq: Two times a day (BID) | CUTANEOUS | 0 refills | Status: AC | PRN

## 2023-02-23 NOTE — Telephone Encounter (Signed)
Please request prior authorization -

## 2023-02-23 NOTE — Telephone Encounter (Signed)
 Please update patient.  Proctozone is covered, rx sent.  Thanks.

## 2023-02-25 ENCOUNTER — Ambulatory Visit
Admission: RE | Admit: 2023-02-25 | Discharge: 2023-02-25 | Disposition: A | Discharge status: Home / Self Care | Payer: Medicare (Managed Care) | Source: Ambulatory Visit | Attending: Family Medicine | Admitting: Family Medicine

## 2023-02-25 DIAGNOSIS — E049 Nontoxic goiter, unspecified: Secondary | ICD-10-CM

## 2023-03-10 NOTE — Telephone Encounter (Signed)
Please see below note and please sign off on rx in this encounter as PA team is unable to resolve RX requests. Thank you

## 2023-03-10 NOTE — Telephone Encounter (Signed)
Medication has been changed, no PA required.

## 2023-03-10 NOTE — Telephone Encounter (Signed)
RX has been sent for the proctozone

## 2023-05-17 ENCOUNTER — Ambulatory Visit: Payer: Medicare (Managed Care) | Admitting: Family Medicine

## 2023-06-10 ENCOUNTER — Ambulatory Visit
Admission: RE | Admit: 2023-06-10 | Discharge: 2023-06-10 | Disposition: A | Discharge status: Home / Self Care | Payer: Medicare (Managed Care) | Source: Ambulatory Visit | Attending: Family Medicine | Admitting: Family Medicine

## 2023-06-10 ENCOUNTER — Ambulatory Visit: Payer: Medicare (Managed Care) | Admitting: Family Medicine

## 2023-06-10 VITALS — BP 142/68 | HR 84 | Temp 99.2°F | Ht 65.75 in | Wt 213.2 lb

## 2023-06-10 DIAGNOSIS — M545 Low back pain, unspecified: Secondary | ICD-10-CM

## 2023-06-10 DIAGNOSIS — E119 Type 2 diabetes mellitus without complications: Secondary | ICD-10-CM

## 2023-06-10 DIAGNOSIS — M25551 Pain in right hip: Secondary | ICD-10-CM

## 2023-06-10 LAB — MICROALBUMIN / CREATININE URINE RATIO
Creatinine,U: 98.7 mg/dL
Microalb Creat Ratio: 98.1 mg/g — ABNORMAL HIGH (ref 0.0–30.0)
Microalb, Ur: 9.7 mg/dL — ABNORMAL HIGH (ref 0.0–1.9)

## 2023-06-10 LAB — POCT GLYCOSYLATED HEMOGLOBIN (HGB A1C): Hemoglobin A1C: 7 % — AB (ref 4.0–5.6)

## 2023-06-10 MED ORDER — TRIAMCINOLONE ACETONIDE 0.5 % EX CREA: 1.0000 | TOPICAL_CREAM | Freq: Two times a day (BID) | CUTANEOUS | 0 refills | Status: AC | PRN

## 2023-06-10 NOTE — Progress Notes (Signed)
 Diabetes:  No meds.   Hypoglycemic episodes: no sx Hyperglycemic episodes: no sx Feet problems: no Blood Sugars averaging: not checked.  eye exam within last year: yes A1c 7.  D/w pt at [facility].   She needed higher strength TAC cream for her elbows.  B olecranon irritation noted with lower strength use.  D/w pt.    Hip pain.  Limiting exercise.  That likely affected her sugar.  She tends to ext rotate her hip at baseline.  Also having R lower back pain at baseline.   Meds, vitals, and allergies reviewed.   ROS: Per HPI unless specifically indicated in ROS section   GEN: nad, alert and oriented HEENT: ncat NECK: supple w/o LA CV: rrr. PULM: ctab, no inc wob ABD: soft, +bs EXT: no edema SKIN: B olecranon irritation noted, chronic irritation.   Pain with resisted R hip flexion.   R lower back with spasms noted w/o bruising or rash.  Able to bear weight.   30 minutes were devoted to patient care in this encounter (this includes time spent reviewing the patient's file/history, interviewing and examining the patient, counseling/reviewing plan with patient).

## 2023-06-10 NOTE — Patient Instructions (Addendum)
 Go to the lab on the way out.   If you have mychart we'll likely use that to update you.    Take care.  Glad to see you. Recheck in about 3 months, labs at the visit.

## 2023-06-11 ENCOUNTER — Telehealth: Payer: Self-pay | Admitting: Family Medicine

## 2023-06-11 DIAGNOSIS — R300 Dysuria: Secondary | ICD-10-CM

## 2023-06-11 MED ORDER — FOSFOMYCIN TROMETHAMINE 3 G PO PACK: 3.0000 g | PACK | Freq: Once | ORAL | 0 refills | Status: AC

## 2023-06-11 NOTE — Telephone Encounter (Signed)
 Patient states that she is not having a fever or burning when she urinates. She states that it feels like a needle pain. She gave a urine sample yesterday and it showed albumin she thinks that is because she had not drank much liquid so just wanted to let you know about that. She is going to come in on Monday and give a urine sample

## 2023-06-11 NOTE — Telephone Encounter (Signed)
 Please verify that she does have burning with urination but not a fever.  If so,  then please come by clinic for u/a and ucx. I put in the orders.  Then start fosfomycin.  I sent the rx in the meantime. Would start abx after collecting urine sample.  Thanks.

## 2023-06-11 NOTE — Addendum Note (Signed)
 Addended by: Donnie Galea on: 06/11/2023 02:09 PM   Modules accepted: Orders

## 2023-06-11 NOTE — Telephone Encounter (Signed)
 Copied from CRM [id number]. Topic: Clinical - Medical Advice >> Jun 11, 2023  8:52 AM Alexandria Fry wrote: Patient says she woke up and has all the symptoms of a bladder infection and was just there yesterday she wants to know if she needs to come back in and do a urin sample or what does the doctor want to do about it.

## 2023-06-13 ENCOUNTER — Other Ambulatory Visit: Payer: Self-pay | Admitting: Family Medicine

## 2023-06-13 ENCOUNTER — Telehealth: Payer: Self-pay | Admitting: Family Medicine

## 2023-06-13 DIAGNOSIS — M545 Low back pain, unspecified: Secondary | ICD-10-CM

## 2023-06-13 DIAGNOSIS — M25551 Pain in right hip: Secondary | ICD-10-CM | POA: Insufficient documentation

## 2023-06-13 MED ORDER — EMPAGLIFLOZIN 10 MG PO TABS: 10.0000 mg | ORAL_TABLET | Freq: Every day | ORAL | Status: DC

## 2023-06-13 NOTE — Assessment & Plan Note (Signed)
 Unclear how much her back is affecting her hip pain or how much her hip pain is affecting her back.  Discussed.  Reasonable to get plain films of both.  See notes on imaging.  Okay for outpatient follow-up.

## 2023-06-13 NOTE — Telephone Encounter (Signed)
 Noted. Thanks. Will await the f/u sample.

## 2023-06-13 NOTE — Assessment & Plan Note (Signed)
 The goal is to address her hip pain so that she can more effectively work on exercise.  That should help her A1c. Recheck in about 3 months, labs at the visit.  No change in meds in the meantime.

## 2023-06-13 NOTE — Telephone Encounter (Signed)
 Please update patient.  See notes on labs.  I am awaiting radiology overread.  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  I do not see an acute fracture.  I would like her to try physical therapy.  Please let me know if she consents for that.  Thanks.

## 2023-06-14 ENCOUNTER — Ambulatory Visit: Payer: Self-pay

## 2023-06-14 ENCOUNTER — Encounter: Payer: Self-pay | Admitting: Family Medicine

## 2023-06-14 ENCOUNTER — Other Ambulatory Visit (INDEPENDENT_AMBULATORY_CARE_PROVIDER_SITE_OTHER): Payer: Medicare (Managed Care)

## 2023-06-14 DIAGNOSIS — R300 Dysuria: Secondary | ICD-10-CM

## 2023-06-14 LAB — URINALYSIS, ROUTINE W REFLEX MICROSCOPIC
Bilirubin Urine: NEGATIVE
Hgb urine dipstick: NEGATIVE
Ketones, ur: NEGATIVE
Leukocytes,Ua: NEGATIVE
Nitrite: NEGATIVE
RBC / HPF: NONE SEEN (ref 0–?)
Specific Gravity, Urine: 1.02 (ref 1.000–1.030)
Total Protein, Urine: NEGATIVE
Urine Glucose: NEGATIVE
Urobilinogen, UA: 0.2 (ref 0.0–1.0)
pH: 6 (ref 5.0–8.0)

## 2023-06-14 NOTE — Telephone Encounter (Signed)
 Copied from CRM [id number]. Topic: Clinical - Prescription Issue >> Jun 14, 2023  3:11 PM Alexandria Fry wrote: Reason for CRM: PT HAS QUESTIONS ABOUT HER MEDICATIONS AND IF SHE SHOULD STOP TAKING ONE BEFORE SHE TAKES THE NEW ONE  spironolactone  (ALDACTONE ) 50 MG tablet Reason for Disposition  [1] Follow-up call from patient regarding patient's clinical status AND [2] information NON-URGENT  Answer Assessment - Initial Assessment Questions 1. REASON FOR CALL or QUESTION: "What is your reason for calling today?" or "How can I best help you?" or "What question do you have that I can help answer?"     PT HAS QUESTIONS ABOUT HER MEDICATIONS AND IF SHE SHOULD STOP TAKING ONE BEFORE SHE TAKES THE NEW ONE  spironolactone  (ALDACTONE ) 50 MG tablet   2. CALLER: Document the source of call. (e.Fry., laboratory, patient).     patient  Protocols used: PCP Call - No Triage-A-AH

## 2023-06-14 NOTE — Telephone Encounter (Signed)
I put in the referral.  Thanks.  

## 2023-06-14 NOTE — Telephone Encounter (Signed)
 Patient agrees to physical therapy she would like to be referred to a location in [place]

## 2023-06-15 LAB — URINE CULTURE
MICRO NUMBER:: 16383026
Result:: NO GROWTH
SPECIMEN QUALITY:: ADEQUATE

## 2023-06-15 MED ORDER — EMPAGLIFLOZIN 10 MG PO TABS: 10.0000 mg | ORAL_TABLET | Freq: Every day | ORAL | Status: DC

## 2023-06-15 NOTE — Telephone Encounter (Signed)
 Should be okay to take jardiance and spironolactone  together but I want to see her ucx first. We'll be in touch.  Thanks.

## 2023-06-15 NOTE — Addendum Note (Signed)
 Addended by: Donnie Galea on: 06/15/2023 01:58 PM   Modules accepted: Orders

## 2023-06-16 ENCOUNTER — Encounter: Payer: Self-pay | Admitting: Family Medicine

## 2023-06-16 ENCOUNTER — Other Ambulatory Visit: Payer: Self-pay | Admitting: Family Medicine

## 2023-06-16 MED ORDER — EMPAGLIFLOZIN 10 MG PO TABS
10.0000 mg | ORAL_TABLET | Freq: Every day | ORAL | 3 refills | Status: DC
Start: 2023-06-16 — End: 2023-06-20

## 2023-06-16 NOTE — Telephone Encounter (Signed)
 Patient notified and verbalized understanding.

## 2023-06-17 ENCOUNTER — Other Ambulatory Visit: Payer: Self-pay | Admitting: Family Medicine

## 2023-06-17 DIAGNOSIS — E119 Type 2 diabetes mellitus without complications: Secondary | ICD-10-CM

## 2023-06-20 ENCOUNTER — Other Ambulatory Visit: Payer: Self-pay | Admitting: Family Medicine

## 2023-06-20 ENCOUNTER — Encounter: Payer: Self-pay | Admitting: Family Medicine

## 2023-06-20 MED ORDER — EMPAGLIFLOZIN 10 MG PO TABS: 10.0000 mg | ORAL_TABLET | Freq: Every day | ORAL | 3 refills | Status: DC
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
## 2023-07-06 ENCOUNTER — Ambulatory Visit: Payer: Medicare (Managed Care) | Attending: Family Medicine

## 2023-07-06 DIAGNOSIS — M6281 Muscle weakness (generalized): Secondary | ICD-10-CM | POA: Diagnosis not present

## 2023-07-06 DIAGNOSIS — M545 Low back pain, unspecified: Secondary | ICD-10-CM | POA: Diagnosis not present

## 2023-07-06 DIAGNOSIS — M5459 Other low back pain: Secondary | ICD-10-CM | POA: Insufficient documentation

## 2023-07-06 DIAGNOSIS — R2689 Other abnormalities of gait and mobility: Secondary | ICD-10-CM | POA: Insufficient documentation

## 2023-07-06 DIAGNOSIS — M25551 Pain in right hip: Secondary | ICD-10-CM | POA: Diagnosis not present

## 2023-07-07 NOTE — Progress Notes (Signed)
 Gwynda Leriche Vallarie Gauze, MD 07/07/23  2:09 PM

## 2023-07-14 ENCOUNTER — Ambulatory Visit: Payer: Medicare (Managed Care)

## 2023-07-14 ENCOUNTER — Telehealth: Payer: Self-pay

## 2023-07-14 NOTE — Telephone Encounter (Signed)
 Called patient about missed appointment. No answer but LVM regarding next following appointment. Advised of no show policy and to call for future rescheduling or concerns.

## 2023-07-20 ENCOUNTER — Ambulatory Visit: Payer: Medicare (Managed Care) | Attending: Family Medicine

## 2023-07-20 DIAGNOSIS — R2689 Other abnormalities of gait and mobility: Secondary | ICD-10-CM | POA: Insufficient documentation

## 2023-07-20 DIAGNOSIS — M6281 Muscle weakness (generalized): Secondary | ICD-10-CM | POA: Diagnosis not present

## 2023-07-20 DIAGNOSIS — M25551 Pain in right hip: Secondary | ICD-10-CM | POA: Insufficient documentation

## 2023-07-20 DIAGNOSIS — M5459 Other low back pain: Secondary | ICD-10-CM | POA: Diagnosis not present
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
## 2023-07-22 ENCOUNTER — Ambulatory Visit: Payer: Medicare (Managed Care)

## 2023-07-22 DIAGNOSIS — R2689 Other abnormalities of gait and mobility: Secondary | ICD-10-CM

## 2023-07-22 DIAGNOSIS — M5459 Other low back pain: Secondary | ICD-10-CM

## 2023-07-22 DIAGNOSIS — M25551 Pain in right hip: Secondary | ICD-10-CM

## 2023-07-22 DIAGNOSIS — M6281 Muscle weakness (generalized): Secondary | ICD-10-CM
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
## 2023-07-27 ENCOUNTER — Ambulatory Visit: Payer: Medicare (Managed Care)

## 2023-07-27 DIAGNOSIS — M5459 Other low back pain: Secondary | ICD-10-CM

## 2023-07-27 DIAGNOSIS — R2689 Other abnormalities of gait and mobility: Secondary | ICD-10-CM

## 2023-07-27 DIAGNOSIS — M6281 Muscle weakness (generalized): Secondary | ICD-10-CM

## 2023-07-27 DIAGNOSIS — M25551 Pain in right hip: Secondary | ICD-10-CM
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
## 2023-07-29 ENCOUNTER — Ambulatory Visit: Payer: Medicare (Managed Care)

## 2023-07-29 DIAGNOSIS — M25551 Pain in right hip: Secondary | ICD-10-CM

## 2023-07-29 DIAGNOSIS — R2689 Other abnormalities of gait and mobility: Secondary | ICD-10-CM

## 2023-07-29 DIAGNOSIS — M5459 Other low back pain: Secondary | ICD-10-CM

## 2023-07-29 DIAGNOSIS — M6281 Muscle weakness (generalized): Secondary | ICD-10-CM
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
## 2023-07-30 DIAGNOSIS — M170 Bilateral primary osteoarthritis of knee: Secondary | ICD-10-CM | POA: Diagnosis not present

## 2023-08-03 ENCOUNTER — Ambulatory Visit: Payer: Medicare (Managed Care)

## 2023-08-03 DIAGNOSIS — M5459 Other low back pain: Secondary | ICD-10-CM

## 2023-08-03 DIAGNOSIS — M25551 Pain in right hip: Secondary | ICD-10-CM

## 2023-08-03 DIAGNOSIS — M6281 Muscle weakness (generalized): Secondary | ICD-10-CM

## 2023-08-03 DIAGNOSIS — R2689 Other abnormalities of gait and mobility: Secondary | ICD-10-CM
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
## 2023-08-05 ENCOUNTER — Ambulatory Visit: Payer: Medicare (Managed Care)

## 2023-08-05 DIAGNOSIS — M5459 Other low back pain: Secondary | ICD-10-CM

## 2023-08-05 DIAGNOSIS — M6281 Muscle weakness (generalized): Secondary | ICD-10-CM

## 2023-08-05 DIAGNOSIS — M25551 Pain in right hip: Secondary | ICD-10-CM

## 2023-08-05 DIAGNOSIS — R2689 Other abnormalities of gait and mobility: Secondary | ICD-10-CM
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
## 2023-08-09 ENCOUNTER — Ambulatory Visit: Payer: Medicare (Managed Care)

## 2023-08-09 DIAGNOSIS — M5459 Other low back pain: Secondary | ICD-10-CM | POA: Diagnosis not present

## 2023-08-09 DIAGNOSIS — M25551 Pain in right hip: Secondary | ICD-10-CM

## 2023-08-09 DIAGNOSIS — M6281 Muscle weakness (generalized): Secondary | ICD-10-CM

## 2023-08-09 DIAGNOSIS — R2689 Other abnormalities of gait and mobility: Secondary | ICD-10-CM
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
## 2023-08-10 ENCOUNTER — Ambulatory Visit: Payer: Medicare (Managed Care)

## 2023-08-11 NOTE — Progress Notes (Signed)
 Diabetes Self-Management Education  Visit Type: First/Initial  Appt. Start Time: 1402 Appt. End Time: 1510  08/16/2023  [name], identified by name and date of birth, is a 68 y.o. female with a diagnosis of Diabetes: Type 2.   ASSESSMENT  Patient is here today alone. Patient would like to learn about medications and food choices. Pt reports she aims for 30 grams of carbohydrates and states this does not happen about two days weekly. Pt reports she has been counting net carbohydrates using nutrition labels. Pt reports Physical Therapy (PT) 2 days weekly for 45 minutes and PT 20 minutes daily.  Patient lives with husband. Pt reports she does the shopping and cooking. Pt reports eating out about about time about once weekly.  All Pt's questions were answered during this encounter.   History includes:   Past Medical History:  Diagnosis Date   Allergic rhinitis    Diabetes mellitus without complication (HCC)    Difficult intubation    Fatty liver    GERD (gastroesophageal reflux disease)    Hypertension    Irregular menses    with neg endometrial biopsy 2013   Menorrhagia    Murmur, cardiac    Pneumonia    2014   PONV (postoperative nausea and vomiting)     Medications include:   Current Outpatient Medications:    Cyanocobalamin (B-12) 3000 MCG CAPS, Take 1 tablet by mouth daily., Disp: , Rfl:    empagliflozin  (JARDIANCE ) 10 MG TABS tablet, Take 1 tablet (10 mg total) by mouth every other day., Disp: , Rfl:    Multiple Vitamin (MULTIVITAMIN) tablet, Take 1 tablet by mouth daily., Disp: , Rfl:    Omega-3 Fatty Acids (FISH OIL OMEGA-3 PO), Take 1,100 mg by mouth daily., Disp: , Rfl:    spironolactone  (ALDACTONE ) 50 MG tablet, TAKE 1 TABLET(50 MG) BY MOUTH DAILY, Disp: 90 tablet, Rfl: 3   VITAMIN D , CHOLECALCIFEROL, PO, Take 4,000 Units by mouth., Disp: , Rfl:    hydrocortisone  (PROCTOZONE -HC) 2.5 % rectal cream, Place 1 Application rectally 2 (two) times daily as needed for  hemorrhoids or anal itching., Disp: 30 g, Rfl: 0   triamcinolone  cream (KENALOG ) 0.5 %, Apply 1 Application topically 2 (two) times daily as needed., Disp: 454 g, Rfl: 0   Labs noted:   Lab Results  Component Value Date   HGBA1C 7.0 (A) 06/10/2023   Lab Results  Component Value Date   CHOL 257 (H) 01/26/2023   HDL 42.30 01/26/2023   LDLCALC 148 (H) 01/26/2023   LDLDIRECT 127.0 12/18/2021   TRIG 336.0 (H) 01/26/2023   CHOLHDL 6 01/26/2023   Lab Results  Component Value Date   NA 137 01/26/2023   CL 101 01/26/2023   K 4.3 01/26/2023   CO2 28 01/26/2023   BUN 19 01/26/2023   CREATININE 1.16 01/26/2023   GFR 49.06 (L) 01/26/2023   CALCIUM 9.9 01/26/2023   PHOS 3.0 01/19/2011   ALBUMIN 4.3 01/26/2023   GLUCOSE 161 (H) 01/26/2023   There were no vitals taken for this visit. There is no height or weight on file to calculate BMI.  Wt Readings from Last 3 Encounters:  08/16/23 202 lb 12.8 oz (92 kg)  06/10/23 213 lb 3.2 oz (96.7 kg)  02/18/23 215 lb 3.2 oz (97.6 kg)    Diabetes Self-Management Education - 08/16/23 1410       Visit Information   Visit Type First/Initial      Initial Visit   Diabetes Type  Type 2    Date Diagnosed 2019    Are you currently following a meal plan? Yes   avoid lettuce   Are you taking your medications as prescribed? Yes      Health Coping   How would you rate your overall health? Good      Psychosocial Assessment   Patient Belief/Attitude about Diabetes Motivated to manage diabetes    What is the hardest part about your diabetes right now, causing you the most concern, or is the most worrisome to you about your diabetes?   Taking/obtaining medications    Self-care barriers None    Self-management support Doctor's office    Other persons present Patient    Patient Concerns Medication    Special Needs None    Preferred Learning Style Visual    Learning Readiness Change in progress    How often do you need to have someone help you when  you read instructions, pamphlets, or other written materials from your doctor or pharmacy? 1 - Never    What is the last grade level you completed in school? college      Pre-Education Assessment   Patient understands the diabetes disease and treatment process. Needs Instruction    Patient understands incorporating nutritional management into lifestyle. Needs Instruction    Patient undertands incorporating physical activity into lifestyle. Needs Instruction    Patient understands using medications safely. Needs Instruction    Patient understands monitoring blood glucose, interpreting and using results Needs Instruction    Patient understands prevention, detection, and treatment of acute complications. Needs Instruction    Patient understands prevention, detection, and treatment of chronic complications. Needs Instruction    Patient understands how to develop strategies to address psychosocial issues. Needs Instruction    Patient understands how to develop strategies to promote health/change behavior. Needs Instruction      Complications   Last HgB A1C per patient/outside source 7 %    How often do you check your blood sugar? 0 times/day (not testing)    Number of hypoglycemic episodes per month 0    Have you had a dilated eye exam in the past 12 months? Yes    Have you had a dental exam in the past 12 months? Yes    Are you checking your feet? No   Pt instructed to check feet daily     Dietary Intake   Breakfast ham, cheese, 2 slices of 100% whole wheat or 1-2 [place] power waffle, pecans, SF syrup, ham or sausage, coffee plain or water    Lunch [employer]: quaterpound no cheese, small fried, zero sugar gingerale or pulled pork, cole claw, halo, water    Snack (afternoon) ~ 3pm: none or 1 ounce nuts or olives or cheese or peanut butter on saltines    Dinner chicken, 2 slices of 100% whole wheat, mayo or meatloaf, green beans, 1/2 cup corn, greens, water or decafe tea with splenda     Beverage(s) coffee plain or water, decafe tea with splenda      Activity / Exercise   Activity / Exercise Type Light (walking / raking leaves)    How many days per week do you exercise? 7    How many minutes per day do you exercise? 20    Total minutes per week of exercise 140      Patient Education   Previous Diabetes Education Yes (please comment)    Disease Pathophysiology Definition of diabetes, type 1 and 2, and the diagnosis  of diabetes    Healthy Eating Carbohydrate counting;Plate Method;Role of diet in the treatment of diabetes and the relationship between the three main macronutrients and blood glucose level;Food label reading, portion sizes and measuring food.;Reviewed blood glucose goals for pre and post meals and how to evaluate the patients' food intake on their blood glucose level.    Being Active Role of exercise on diabetes management, blood pressure control and cardiac health.    Medications Reviewed patients medication for diabetes, action, purpose, timing of dose and side effects.    Monitoring Identified appropriate SMBG and/or A1C goals.;Daily foot exams    Acute complications Discussed and identified patients' prevention, symptoms, and treatment of hyperglycemia.    Chronic complications Relationship between chronic complications and blood glucose control    Diabetes Stress and Support Identified and addressed patients feelings and concerns about diabetes;Worked with patient to identify barriers to care and solutions;Role of stress on diabetes    Preconception care --   n/a   Lifestyle and Health Coping Lifestyle issues that need to be addressed for better diabetes care      Individualized Goals (developed by patient)   Nutrition Follow meal plan discussed    Physical Activity Exercise 5-7 days per week;15 minutes per day;30 minutes per day    Medications take my medication as prescribed    Monitoring  Test my blood glucose as discussed    Problem Solving Eating  Pattern;Addressing barriers to behavior change;Medication consistency    Reducing Risk do foot checks daily    Health Coping Ask for help with psychological, social, or emotional issues      Post-Education Assessment   Patient understands the diabetes disease and treatment process. Needs Review    Patient understands incorporating nutritional management into lifestyle. Comprehends key points    Patient undertands incorporating physical activity into lifestyle. Comprehends key points    Patient understands using medications safely. Comphrehends key points    Patient understands monitoring blood glucose, interpreting and using results Comprehends key points    Patient understands prevention, detection, and treatment of acute complications. Needs Review    Patient understands prevention, detection, and treatment of chronic complications. Demonstrates understanding / competency    Patient understands how to develop strategies to address psychosocial issues. Needs Review    Patient understands how to develop strategies to promote health/change behavior. Needs Review      Outcomes   Expected Outcomes Demonstrated interest in learning but significant barriers to change    Future DMSE 3-4 months    Program Status Not Completed          Individualized Plan for Diabetes Self-Management Training:   Learning Objective:  Patient will have a greater understanding of diabetes self-management. Patient education plan is to attend individual and/or group sessions per assessed needs and concerns.   Plan:   Patient Instructions  Count carbs aiming for 30-45 grams per meal   Expected Outcomes:  Demonstrated interest in learning but significant barriers to change  Education material provided: ADA - How to Thrive: A Guide for Your Journey with Diabetes, My Plate, Snack sheet, and Diabetes Resources  If problems or questions, patient to contact team via:  Phone  Future DSME appointment: 3-4  months

## 2023-08-12 ENCOUNTER — Ambulatory Visit: Payer: Medicare (Managed Care) | Admitting: Family Medicine

## 2023-08-12 ENCOUNTER — Ambulatory Visit: Payer: Medicare (Managed Care)

## 2023-08-12 ENCOUNTER — Telehealth: Payer: Self-pay

## 2023-08-12 DIAGNOSIS — M6281 Muscle weakness (generalized): Secondary | ICD-10-CM

## 2023-08-12 DIAGNOSIS — M25551 Pain in right hip: Secondary | ICD-10-CM

## 2023-08-12 DIAGNOSIS — R2689 Other abnormalities of gait and mobility: Secondary | ICD-10-CM

## 2023-08-12 DIAGNOSIS — M5459 Other low back pain: Secondary | ICD-10-CM | POA: Diagnosis not present

## 2023-08-12 NOTE — Telephone Encounter (Signed)
 Called patient regarding appt time. No answer, left voicemail regarding next appointment. Patient arrived following end of phone call.   Lonni Pall PT, DPT Physical Therapist- [place]
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
## 2023-08-16 ENCOUNTER — Encounter: Payer: Self-pay | Admitting: Family Medicine

## 2023-08-16 ENCOUNTER — Ambulatory Visit (INDEPENDENT_AMBULATORY_CARE_PROVIDER_SITE_OTHER): Payer: Medicare (Managed Care) | Admitting: Family Medicine

## 2023-08-16 ENCOUNTER — Encounter: Payer: Medicare (Managed Care) | Attending: Family Medicine | Admitting: Dietician

## 2023-08-16 VITALS — BP 138/72 | HR 75 | Temp 98.0°F | Ht 65.75 in | Wt 202.8 lb

## 2023-08-16 DIAGNOSIS — Z713 Dietary counseling and surveillance: Secondary | ICD-10-CM | POA: Insufficient documentation

## 2023-08-16 DIAGNOSIS — Z7984 Long term (current) use of oral hypoglycemic drugs: Secondary | ICD-10-CM

## 2023-08-16 DIAGNOSIS — E119 Type 2 diabetes mellitus without complications: Secondary | ICD-10-CM

## 2023-08-16 DIAGNOSIS — M79606 Pain in leg, unspecified: Secondary | ICD-10-CM

## 2023-08-16 MED ORDER — EMPAGLIFLOZIN 10 MG PO TABS: 10.0000 mg | ORAL_TABLET | ORAL | Status: AC

## 2023-08-16 NOTE — Patient Instructions (Signed)
 Count carbs aiming for 30-45 grams per meal

## 2023-08-16 NOTE — Patient Instructions (Addendum)
 I would gently stretch and use ice as needed.   I would try taking jardiance  every other day.  If you still have frequent urination at night, then stop it and let me know.  Take care.  Glad to see you. Recheck in mid to late August at a visit.  A1c at the visit.  Update me as needed in the meantime.

## 2023-08-16 NOTE — Progress Notes (Unsigned)
 Recheck temp 98.    She had HA and fatigue a few days ago.  That got better in the meantime.  This was with concurrent sig weather changes.   D/w pt about jardiance .  She has sig nocturia with med use.  Hasn't checked sugar much recently.    She had an adductor strain/pain after PT.  Her hip feels better.    Meds, vitals, and allergies reviewed.   ROS: Per HPI unless specifically indicated in ROS section   R medial distal thigh adductor ttp w/o bruising.

## 2023-08-17 ENCOUNTER — Ambulatory Visit: Payer: Medicare (Managed Care) | Attending: Family Medicine

## 2023-08-17 DIAGNOSIS — M25551 Pain in right hip: Secondary | ICD-10-CM | POA: Diagnosis not present

## 2023-08-17 DIAGNOSIS — M5459 Other low back pain: Secondary | ICD-10-CM | POA: Insufficient documentation

## 2023-08-17 DIAGNOSIS — R2689 Other abnormalities of gait and mobility: Secondary | ICD-10-CM | POA: Insufficient documentation

## 2023-08-17 DIAGNOSIS — M6281 Muscle weakness (generalized): Secondary | ICD-10-CM | POA: Diagnosis not present
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
## 2023-08-18 NOTE — Assessment & Plan Note (Signed)
 Discussed options. Defer labs at this point. I would try taking jardiance  every other day.  Discussed not cutting the pills in half. If still having frequent urination at night, then stop it and let me know.  Recheck in mid to late August at a visit.  A1c at the visit.

## 2023-08-18 NOTE — Assessment & Plan Note (Signed)
 Discussed anatomy and options. I would gently stretch and use ice as needed.   Update me as needed.

## 2023-08-19 ENCOUNTER — Ambulatory Visit: Payer: Medicare (Managed Care)

## 2023-08-19 DIAGNOSIS — M25551 Pain in right hip: Secondary | ICD-10-CM

## 2023-08-19 DIAGNOSIS — M6281 Muscle weakness (generalized): Secondary | ICD-10-CM

## 2023-08-19 DIAGNOSIS — R2689 Other abnormalities of gait and mobility: Secondary | ICD-10-CM

## 2023-08-19 DIAGNOSIS — M5459 Other low back pain: Secondary | ICD-10-CM
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
## 2023-08-23 ENCOUNTER — Ambulatory Visit: Payer: Medicare (Managed Care)

## 2023-08-23 DIAGNOSIS — M5459 Other low back pain: Secondary | ICD-10-CM

## 2023-08-23 DIAGNOSIS — M6281 Muscle weakness (generalized): Secondary | ICD-10-CM

## 2023-08-23 DIAGNOSIS — M25551 Pain in right hip: Secondary | ICD-10-CM

## 2023-08-23 DIAGNOSIS — R2689 Other abnormalities of gait and mobility: Secondary | ICD-10-CM
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
## 2023-08-26 ENCOUNTER — Ambulatory Visit: Payer: Medicare (Managed Care)

## 2023-08-26 DIAGNOSIS — M5459 Other low back pain: Secondary | ICD-10-CM

## 2023-08-26 DIAGNOSIS — R2689 Other abnormalities of gait and mobility: Secondary | ICD-10-CM

## 2023-08-26 DIAGNOSIS — M25551 Pain in right hip: Secondary | ICD-10-CM

## 2023-08-26 DIAGNOSIS — M6281 Muscle weakness (generalized): Secondary | ICD-10-CM
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
## 2023-08-30 ENCOUNTER — Ambulatory Visit: Payer: Medicare (Managed Care)

## 2023-09-02 ENCOUNTER — Ambulatory Visit: Payer: Medicare (Managed Care)

## 2023-09-02 DIAGNOSIS — M5459 Other low back pain: Secondary | ICD-10-CM

## 2023-09-02 DIAGNOSIS — R2689 Other abnormalities of gait and mobility: Secondary | ICD-10-CM

## 2023-09-02 DIAGNOSIS — M6281 Muscle weakness (generalized): Secondary | ICD-10-CM

## 2023-09-02 DIAGNOSIS — M25551 Pain in right hip: Secondary | ICD-10-CM
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
## 2023-09-07 ENCOUNTER — Ambulatory Visit: Payer: Medicare (Managed Care)

## 2023-09-07 DIAGNOSIS — M25551 Pain in right hip: Secondary | ICD-10-CM

## 2023-09-07 DIAGNOSIS — M5459 Other low back pain: Secondary | ICD-10-CM

## 2023-09-07 DIAGNOSIS — R2689 Other abnormalities of gait and mobility: Secondary | ICD-10-CM

## 2023-09-07 DIAGNOSIS — M6281 Muscle weakness (generalized): Secondary | ICD-10-CM
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
## 2023-09-09 ENCOUNTER — Ambulatory Visit: Payer: Medicare (Managed Care)

## 2023-09-09 ENCOUNTER — Other Ambulatory Visit: Payer: Self-pay | Admitting: Family Medicine

## 2023-09-09 DIAGNOSIS — Z1231 Encounter for screening mammogram for malignant neoplasm of breast: Secondary | ICD-10-CM

## 2023-09-09 DIAGNOSIS — M5459 Other low back pain: Secondary | ICD-10-CM | POA: Diagnosis not present

## 2023-09-09 DIAGNOSIS — R2689 Other abnormalities of gait and mobility: Secondary | ICD-10-CM

## 2023-09-09 DIAGNOSIS — M6281 Muscle weakness (generalized): Secondary | ICD-10-CM

## 2023-09-09 DIAGNOSIS — M25551 Pain in right hip: Secondary | ICD-10-CM
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
## 2023-09-13 ENCOUNTER — Ambulatory Visit: Payer: Medicare (Managed Care)

## 2023-09-13 ENCOUNTER — Ambulatory Visit: Payer: Medicare (Managed Care) | Admitting: Family Medicine

## 2023-09-13 DIAGNOSIS — M25551 Pain in right hip: Secondary | ICD-10-CM

## 2023-09-13 DIAGNOSIS — M5459 Other low back pain: Secondary | ICD-10-CM

## 2023-09-13 DIAGNOSIS — R2689 Other abnormalities of gait and mobility: Secondary | ICD-10-CM

## 2023-09-13 DIAGNOSIS — M6281 Muscle weakness (generalized): Secondary | ICD-10-CM
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
## 2023-09-15 ENCOUNTER — Ambulatory Visit: Payer: Medicare (Managed Care)

## 2023-09-16 ENCOUNTER — Ambulatory Visit: Payer: Medicare (Managed Care)

## 2023-09-16 DIAGNOSIS — M5459 Other low back pain: Secondary | ICD-10-CM | POA: Diagnosis not present

## 2023-09-16 DIAGNOSIS — R2689 Other abnormalities of gait and mobility: Secondary | ICD-10-CM

## 2023-09-16 DIAGNOSIS — M6281 Muscle weakness (generalized): Secondary | ICD-10-CM

## 2023-09-16 DIAGNOSIS — M25551 Pain in right hip: Secondary | ICD-10-CM
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
## 2023-09-21 ENCOUNTER — Ambulatory Visit: Payer: Medicare (Managed Care) | Attending: Family Medicine

## 2023-09-21 DIAGNOSIS — M25551 Pain in right hip: Secondary | ICD-10-CM | POA: Diagnosis not present

## 2023-09-21 DIAGNOSIS — M6281 Muscle weakness (generalized): Secondary | ICD-10-CM | POA: Diagnosis not present

## 2023-09-21 DIAGNOSIS — M5459 Other low back pain: Secondary | ICD-10-CM | POA: Insufficient documentation

## 2023-09-21 DIAGNOSIS — R2689 Other abnormalities of gait and mobility: Secondary | ICD-10-CM | POA: Diagnosis not present
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
## 2023-09-23 ENCOUNTER — Ambulatory Visit: Payer: Medicare (Managed Care)

## 2023-09-23 DIAGNOSIS — R2689 Other abnormalities of gait and mobility: Secondary | ICD-10-CM

## 2023-09-23 DIAGNOSIS — M6281 Muscle weakness (generalized): Secondary | ICD-10-CM

## 2023-09-23 DIAGNOSIS — M5459 Other low back pain: Secondary | ICD-10-CM | POA: Diagnosis not present

## 2023-09-23 DIAGNOSIS — M25551 Pain in right hip: Secondary | ICD-10-CM
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
## 2023-09-27 ENCOUNTER — Ambulatory Visit: Payer: Medicare (Managed Care)

## 2023-09-27 DIAGNOSIS — M6281 Muscle weakness (generalized): Secondary | ICD-10-CM

## 2023-09-27 DIAGNOSIS — M25551 Pain in right hip: Secondary | ICD-10-CM

## 2023-09-27 DIAGNOSIS — M5459 Other low back pain: Secondary | ICD-10-CM
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
## 2023-09-28 ENCOUNTER — Ambulatory Visit: Payer: Medicare (Managed Care)

## 2023-09-30 ENCOUNTER — Ambulatory Visit: Payer: Medicare (Managed Care)

## 2023-09-30 DIAGNOSIS — M5459 Other low back pain: Secondary | ICD-10-CM

## 2023-09-30 DIAGNOSIS — M6281 Muscle weakness (generalized): Secondary | ICD-10-CM

## 2023-09-30 DIAGNOSIS — R2689 Other abnormalities of gait and mobility: Secondary | ICD-10-CM

## 2023-09-30 DIAGNOSIS — M25551 Pain in right hip: Secondary | ICD-10-CM
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
## 2023-10-05 ENCOUNTER — Ambulatory Visit: Payer: Medicare (Managed Care)

## 2023-10-05 DIAGNOSIS — M25551 Pain in right hip: Secondary | ICD-10-CM

## 2023-10-05 DIAGNOSIS — M6281 Muscle weakness (generalized): Secondary | ICD-10-CM

## 2023-10-05 DIAGNOSIS — M5459 Other low back pain: Secondary | ICD-10-CM
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
## 2023-10-06 NOTE — Progress Notes (Signed)
 Agree.  Thanks.  Arlyss EDISON Cleatus, MD 10/06/23  4:55 PM

## 2023-10-07 ENCOUNTER — Ambulatory Visit: Payer: Medicare (Managed Care) | Admitting: Family Medicine

## 2023-10-07 ENCOUNTER — Ambulatory Visit: Payer: Medicare (Managed Care)

## 2023-10-07 DIAGNOSIS — M25551 Pain in right hip: Secondary | ICD-10-CM

## 2023-10-07 DIAGNOSIS — R2689 Other abnormalities of gait and mobility: Secondary | ICD-10-CM

## 2023-10-07 DIAGNOSIS — M6281 Muscle weakness (generalized): Secondary | ICD-10-CM

## 2023-10-07 DIAGNOSIS — M5459 Other low back pain: Secondary | ICD-10-CM | POA: Diagnosis not present
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
## 2023-10-08 ENCOUNTER — Ambulatory Visit: Payer: Medicare (Managed Care) | Admitting: Family Medicine

## 2023-10-08 ENCOUNTER — Ambulatory Visit (INDEPENDENT_AMBULATORY_CARE_PROVIDER_SITE_OTHER)
Admission: RE | Admit: 2023-10-08 | Discharge: 2023-10-08 | Disposition: A | Discharge status: Home / Self Care | Payer: Medicare (Managed Care) | Source: Ambulatory Visit | Attending: Family Medicine | Admitting: Family Medicine

## 2023-10-08 VITALS — BP 128/84 | HR 80 | Temp 99.0°F | Ht 65.75 in | Wt 206.0 lb

## 2023-10-08 DIAGNOSIS — E119 Type 2 diabetes mellitus without complications: Secondary | ICD-10-CM

## 2023-10-08 DIAGNOSIS — M25612 Stiffness of left shoulder, not elsewhere classified: Secondary | ICD-10-CM | POA: Diagnosis not present

## 2023-10-08 LAB — POCT GLYCOSYLATED HEMOGLOBIN (HGB A1C): Hemoglobin A1C: 5.9 % — AB (ref 4.0–5.6)

## 2023-10-08 NOTE — Progress Notes (Signed)
 Diabetes:  Using medications without difficulties: yes Hypoglycemic episodes: no Hyperglycemic episodes: no Feet problems: no Blood Sugars averaging: 120-130s.  eye exam within last year: yes Taking jardiance  10mg  ~every other day.  Statin intolerant.    She has been working on diet and exercise.    She has limited L shoulder external rotation.  No pain but limited ext rotation.    Still on spironolactone  at baseline.   PMH and SH reviewed  Meds, vitals, and allergies reviewed.   ROS: Per HPI unless specifically indicated in ROS section   GEN: nad, alert and oriented HEENT: mucous membranes moist NECK: supple w/o LA CV: rrr. PULM: ctab, no inc wob ABD: soft, +bs EXT: no edema SKIN: no acute rash Left shoulder with normal internal rotation range of motion.  No arm drop but decreased range of motion with external rotation.  AC joint not tender.  No bruising.  No erythema.

## 2023-10-08 NOTE — Patient Instructions (Addendum)
 Go to the lab on the way out.   If you have mychart we'll likely use that to update you.    Take care.  Glad to see you.  Recheck at a yearly visit in 2026, labs ahead of time if possible.

## 2023-10-10 ENCOUNTER — Telehealth: Payer: Self-pay | Admitting: Family Medicine

## 2023-10-10 DIAGNOSIS — M25612 Stiffness of left shoulder, not elsewhere classified: Secondary | ICD-10-CM

## 2023-10-10 NOTE — Assessment & Plan Note (Signed)
 Taking jardiance  10mg  ~every other day.  Statin intolerant.   A1c clearly better.  She has been working on diet and exercise. Recheck at a yearly visit in 2026, labs ahead of time if possible.  I thanked her for her effort.

## 2023-10-10 NOTE — Assessment & Plan Note (Signed)
See notes on plain films. 

## 2023-10-10 NOTE — Telephone Encounter (Signed)
 Please update patient.  Awaiting over read on her x-ray of her shoulder.  I do not see a fracture or acute changes.  If she is not improving with PT then I think it makes sense to see orthopedics.  Let me know if she needs a referral.  Thanks.

## 2023-10-11 ENCOUNTER — Ambulatory Visit: Payer: Medicare (Managed Care) | Admitting: Dietician

## 2023-10-12 ENCOUNTER — Ambulatory Visit: Payer: Medicare (Managed Care)

## 2023-10-12 ENCOUNTER — Other Ambulatory Visit: Payer: Medicare (Managed Care)

## 2023-10-12 DIAGNOSIS — M6281 Muscle weakness (generalized): Secondary | ICD-10-CM

## 2023-10-12 DIAGNOSIS — M5459 Other low back pain: Secondary | ICD-10-CM | POA: Diagnosis not present

## 2023-10-12 DIAGNOSIS — R2689 Other abnormalities of gait and mobility: Secondary | ICD-10-CM

## 2023-10-12 DIAGNOSIS — M25551 Pain in right hip: Secondary | ICD-10-CM

## 2023-10-12 NOTE — Telephone Encounter (Signed)
 Left voicemail for patient to return call to office.
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
## 2023-10-13 ENCOUNTER — Ambulatory Visit
Admission: RE | Admit: 2023-10-13 | Discharge: 2023-10-13 | Disposition: A | Discharge status: Home / Self Care | Payer: Medicare (Managed Care) | Source: Ambulatory Visit | Attending: Primary Care | Admitting: Primary Care

## 2023-10-13 ENCOUNTER — Ambulatory Visit
Admission: RE | Admit: 2023-10-13 | Discharge: 2023-10-13 | Disposition: A | Discharge status: Home / Self Care | Payer: Medicare (Managed Care) | Source: Ambulatory Visit | Attending: Family Medicine | Admitting: Family Medicine

## 2023-10-13 DIAGNOSIS — Z1231 Encounter for screening mammogram for malignant neoplasm of breast: Secondary | ICD-10-CM | POA: Insufficient documentation

## 2023-10-13 DIAGNOSIS — Z13820 Encounter for screening for osteoporosis: Secondary | ICD-10-CM | POA: Diagnosis not present

## 2023-10-13 DIAGNOSIS — R928 Other abnormal and inconclusive findings on diagnostic imaging of breast: Secondary | ICD-10-CM | POA: Insufficient documentation

## 2023-10-13 DIAGNOSIS — M8589 Other specified disorders of bone density and structure, multiple sites: Secondary | ICD-10-CM | POA: Diagnosis not present

## 2023-10-14 ENCOUNTER — Ambulatory Visit: Payer: Medicare (Managed Care)

## 2023-10-14 NOTE — Telephone Encounter (Signed)
 Called patient and reviewed all information. Patient verbalized understanding.  Patient has not been able to have PT on shoulder due to the referral not including orders for the shoulder. She has been receiving PT for her hip and back. She would like the referral updated to include her shoulder so she can get some help from PT with that. She is seeing Lonni Go at [facility] PT in [place].

## 2023-10-15 ENCOUNTER — Ambulatory Visit: Payer: Self-pay | Admitting: Primary Care

## 2023-10-15 NOTE — Telephone Encounter (Signed)
 Per referral encounter, Alexandria Fry has sent this over to [facility] office.

## 2023-10-15 NOTE — Telephone Encounter (Signed)
 I added a new PT order.  Please check with referrals to make sure this will suffice.  Thanks.

## 2023-10-19 ENCOUNTER — Ambulatory Visit: Payer: Medicare (Managed Care) | Attending: Family Medicine

## 2023-10-19 ENCOUNTER — Telehealth: Payer: Self-pay

## 2023-10-19 ENCOUNTER — Other Ambulatory Visit: Payer: Self-pay | Admitting: Family Medicine

## 2023-10-19 DIAGNOSIS — R928 Other abnormal and inconclusive findings on diagnostic imaging of breast: Secondary | ICD-10-CM

## 2023-10-19 NOTE — Telephone Encounter (Signed)
 Called patient about missed PT appointment. No answer but LVM on next week's appointment and to call if any questions arise.

## 2023-10-20 ENCOUNTER — Ambulatory Visit: Payer: Self-pay | Admitting: Family Medicine

## 2023-10-21 ENCOUNTER — Ambulatory Visit
Admission: RE | Admit: 2023-10-21 | Discharge: 2023-10-21 | Disposition: A | Discharge status: Home / Self Care | Payer: Medicare (Managed Care) | Source: Ambulatory Visit | Attending: Family Medicine | Admitting: Family Medicine

## 2023-10-21 ENCOUNTER — Inpatient Hospital Stay
Admission: RE | Admit: 2023-10-21 | Discharge: 2023-10-21 | Discharge status: Home / Self Care | Payer: Medicare (Managed Care) | Source: Ambulatory Visit | Attending: Family Medicine | Admitting: Family Medicine

## 2023-10-21 DIAGNOSIS — R928 Other abnormal and inconclusive findings on diagnostic imaging of breast: Secondary | ICD-10-CM | POA: Insufficient documentation

## 2023-10-24 ENCOUNTER — Ambulatory Visit: Payer: Self-pay | Admitting: Family Medicine

## 2023-10-27 ENCOUNTER — Ambulatory Visit: Payer: Medicare (Managed Care)

## 2023-10-28 ENCOUNTER — Ambulatory Visit
Admission: RE | Admit: 2023-10-28 | Discharge: 2023-10-28 | Disposition: A | Discharge status: Home / Self Care | Payer: Medicare (Managed Care) | Source: Ambulatory Visit | Attending: Family Medicine | Admitting: Family Medicine

## 2023-10-28 DIAGNOSIS — C50812 Malignant neoplasm of overlapping sites of left female breast: Secondary | ICD-10-CM | POA: Diagnosis not present

## 2023-10-28 DIAGNOSIS — R928 Other abnormal and inconclusive findings on diagnostic imaging of breast: Secondary | ICD-10-CM | POA: Diagnosis not present

## 2023-10-28 HISTORY — PX: BREAST BIOPSY: SHX20

## 2023-10-28 MED ORDER — LIDOCAINE 1 % OPTIME INJ - NO CHARGE
2.0000 mL | Freq: Once | INTRAMUSCULAR | Status: AC
Start: 2023-10-28 — End: 2023-10-28
  Administered 2023-10-28: 2 mL
  Filled 2023-10-28: qty 2

## 2023-10-28 MED ORDER — LIDOCAINE-EPINEPHRINE 1 %-1:100000 IJ SOLN
8.0000 mL | Freq: Once | INTRAMUSCULAR | Status: AC
  Administered 2023-10-28: 8 mL
  Filled 2023-10-28: qty 8

## 2023-10-29 ENCOUNTER — Encounter: Payer: Self-pay | Admitting: *Deleted

## 2023-10-29 DIAGNOSIS — C50912 Malignant neoplasm of unspecified site of left female breast: Secondary | ICD-10-CM

## 2023-10-29 LAB — SURGICAL PATHOLOGY

## 2023-10-29 NOTE — Telephone Encounter (Signed)
 Called pt re: dx, upcoming appointment.  ER and PR still pending. I have all confidence in the docs she will be seeing, d/w pt.    D/w pt about vaccines- I think it makes sense to get through the upcoming appointments first.    She had an irritated skin lesion not on the breast.  D/w pt about local care and she can ask for eval about this next week, with routine cautions given for the interval.    She thanked me for the call.  I thank all involved.

## 2023-10-29 NOTE — Progress Notes (Signed)
 Received referral for newly diagnosed breast cancer from [facility].  Navigation initiated.  She will see Dr. Melanee on Friday 9/19 and Dr. Jordis on Monday 9/15

## 2023-11-01 ENCOUNTER — Encounter: Payer: Self-pay | Admitting: Surgery

## 2023-11-01 ENCOUNTER — Ambulatory Visit: Payer: Self-pay | Admitting: Surgery

## 2023-11-01 VITALS — BP 167/100 | HR 109 | Temp 99.6°F | Ht 65.0 in | Wt 206.0 lb

## 2023-11-01 DIAGNOSIS — C50412 Malignant neoplasm of upper-outer quadrant of left female breast: Secondary | ICD-10-CM

## 2023-11-01 NOTE — Patient Instructions (Addendum)
 We will see you back next week for another follow up with Dr Jordis to discuss surgery.  If you have any questions or concerns please dont hesitate to call the office.    Breast Cancer, Female  Breast cancer is a malignant growth of tissue (tumor) in the breast. Unlike noncancerous (benign) tumors, malignant tumors are cancerous and can spread to other parts of the body. The two most common types of breast cancer start in the milk ducts (ductal carcinoma) or in the lobules where milk is made in the breast (lobular carcinoma). Breast cancer is one of the most common types of cancer in women. What are the causes? The exact cause of female breast cancer is unknown. What increases the risk? The following factors may make you more likely to develop this condition: Being older than 67 years of age. Having a family history of breast cancer. Starting menopause after age 41. Starting your menstrual periods before age 87. Having never been pregnant or having your first child after age 61. Having never breastfed. A personal history of: Breast cancer. Dense breast tissue. Radiation exposure. Having the BRCA1 and BRCA2 genes. Having certain types of benign breast conditions. Exposure to the drug DES, which was given to pregnant women from the 1940s to the 1970s. Other risks include: Using birth control pills. Using hormone therapy after menopause. Drinking more than one alcoholic drink a day. Obesity. What are the signs or symptoms? Symptoms of this condition include: A painless lump or thickening in your breast. Changes in the size or shape of your breast. Breast skin changes, such as puckering or dimpling. Nipple abnormalities, such as scaling, crustiness, redness, or pulling in (retraction). Nipple discharge that is bloody or clear. How is this diagnosed? This condition may be diagnosed by: Taking your medical history and doing a physical exam. During the exam, your health care provider  will feel the tissue around your breast and under your arms. Taking a sample of nipple discharge. The sample will be examined under a microscope. Performing imaging tests, such as breast X-rays (mammogram), ultrasound, or MRI. Taking a tissue sample (biopsy) from the breast. The sample will be examined under a microscope to look for cancer cells. Taking a sample from the lymph nodes near the affected breast (sentinel node biopsy). Your cancer will be staged to determine its severity and extent. Staging is a careful attempt to find out the size of the tumor, whether the cancer has spread, and if so, to what parts of the body. Staging also includes testing your tumor for certain receptors, such as estrogen, progesterone, and human epidermal growth factor receptor 2 (HER2). This will help your cancer care team decide on a treatment that will work best for you. You may need to have more tests to determine the stage of your cancer. Stages include the following: Stage 0--The tumor has not spread to other breast tissue. Stage 1 (I)--The cancer is only found in the breast or may be in the lymph nodes. The tumor may be up to  inch (2 cm) wide. Stage 2 (II)--The cancer has spread to nearby lymph nodes. The tumor may be up to 2 inches (5 cm) wide. Stage 3 (III)--The cancer has spread to more distant lymph nodes. The tumor may be larger than 2 inches (5 cm) wide. Stage 4 (IV)--The cancer has spread to other parts of the body, such as the bones, brain, liver, or lungs. How is this treated? Treatment for this condition depends on the type  and stage of the breast cancer. It may be treated with: Surgery. This may involve breast-conserving surgery (lumpectomy or partial mastectomy) in which only the part of the breast containing the cancer is removed. Some normal tissue surrounding this area may also be removed. In some cases, surgery may be done to remove the entire breast (mastectomy) and nipple. Lymph nodes may also  be removed. Radiation therapy, which uses high-energy rays to kill cancer cells. Chemotherapy, which is the use of medicines to kill cancer cells. Hormone therapy, which involves taking medicine to adjust the hormone levels in your body. You may take medicine to decrease your estrogen levels. This can help stop cancer cells from growing. Targeted therapy, in which medicines are used to block the growth and spread of cancer cells. These medicines target a specific part of the cancer cell and usually cause fewer side effects than chemotherapy. Targeted therapy may be used alone or in combination with chemotherapy. Immunotherapy, which is the use of medicines to boost the immune system to recognize and destroy cancer cells more effectively. A combination of surgery, radiation, chemotherapy, or hormone therapy may be needed to treat breast cancer. Follow these instructions at home: Take over-the-counter and prescription medicines only as told by your health care provider. Eat a healthy diet. A healthy diet includes lots of fruits and vegetables, low-fat dairy products, lean meats, and fiber. Make sure half your plate is filled with fruits or vegetables. Choose high-fiber foods such as whole-grain breads and cereals. Consider joining a support group. This may help you cope with the stress of having breast cancer. Talk to your health care team about exercise and physical activity. The right exercise program can: Help prevent or reduce symptoms such as fatigue or depression. Improve overall health and survival rates. Keep all follow-up visits. This is important. Where to find more information American Cancer Society: www.cancer.org National Cancer Institute: www.cancer.gov Contact a health care provider if: You have a sudden increase in pain. You have any symptoms or changes that concern you. You lose weight without trying. You notice a new lump in either breast or under your arm. You develop  swelling in either arm or hand. You have a fever. You notice new fatigue or weakness. Get help right away if: You have chest pain or trouble breathing. These symptoms may be an emergency. Get help right away. Call 911. Do not wait to see if the symptoms will go away. Do not drive yourself to the hospital. Summary Breast cancer is a malignant growth of tissue (tumor) in the breast. Your cancer will be staged to determine its severity and extent. Treatment for this condition depends on the type and stage of the breast cancer. This information is not intended to replace advice given to you by your health care provider. Make sure you discuss any questions you have with your health care provider. Document Revised: 12/23/2020 Document Reviewed: 12/23/2020 Elsevier Patient Education  2024 [employer].

## 2023-11-01 NOTE — Progress Notes (Signed)
 Patient ID: Alexandria Fry, female   DOB: 09/06/1955, 68 y.o.   MRN: [id number]  HPI Alexandria Fry is a 68 y.o. female seen in consultation for a newly diagnosed breast cancer on the left side.  She denies any palpable lesions.  No fevers no chills no weight loss.  Specifically denies any breast discharge. Routine mammogram pers reviewed a suspicious lesion was seen on the left side, she did have an ultrasound showed a lesion at 3:00 and a biopsy performed showing evidence of invasive breast cancer.  Pending hormonal receptors. She is able to perform more than 4 METS of activity without shortness of breath. He does have a history of arthritis and has been working with physical therapy. Abd Hysterectomy ETOH daily, she will quit Quit smoking several decades ago Mother breast CA  ( DCIS) around 30 she was postmenopausal Father pancreatic CA apparently treated at [facility]  ( sarcomatous cancer) SHe was hospitalized for pneumonia recently  No prior breast biopsies Last menstrual period age 16 [id number] + birth control HPI  Past Medical History:  Diagnosis Date   Allergic rhinitis    Diabetes mellitus without complication (HCC)    Difficult intubation    Fatty liver    GERD (gastroesophageal reflux disease)    Hypertension    Irregular menses    with neg endometrial biopsy 2013   Menorrhagia    Murmur, cardiac    Pneumonia    2014   PONV (postoperative nausea and vomiting)     Past Surgical History:  Procedure Laterality Date   ABDOMINAL HYSTERECTOMY     BREAST BIOPSY Left 10/28/2023   u/s bx heart clip path pending   BREAST BIOPSY Left 10/28/2023   US  LT BREAST BX W LOC DEV 1ST LESION IMG BX SPEC US  GUIDE 10/28/2023 [facility]   CERVICAL CONE BIOPSY     COLONOSCOPY WITH PROPOFOL  N/A 01/18/2018   Procedure: COLONOSCOPY WITH PROPOFOL ;  Surgeon: [name], MD;  Location: [facility] ENDOSCOPY;  Service: Endoscopy;  Laterality: N/A;   COLONOSCOPY WITH PROPOFOL   N/A 02/25/2022   Procedure: COLONOSCOPY WITH PROPOFOL ;  Surgeon: [name], MD;  Location: [facility] ENDOSCOPY;  Service: Gastroenterology;  Laterality: N/A;   CYSTOSCOPY  11/11/2015   Procedure: CYSTOSCOPY;  Surgeon: [name], MD;  Location: [facility] ORS;  Service: Gynecology;;   DILATATION & CURETTAGE/HYSTEROSCOPY WITH MYOSURE N/A 03/21/2015   Procedure: DILATATION & CURETTAGE/HYSTEROSCOPY, POLYPECTOMY;  Surgeon: [name], MD;  Location: [facility] ORS;  Service: Gynecology;  Laterality: N/A;   DILATION AND CURETTAGE OF UTERUS  1981   [facility]    DILATION AND CURETTAGE OF UTERUS  06/25/2009   normal (Dr. [name])   LAPAROSCOPIC BILATERAL SALPINGECTOMY Bilateral 11/11/2015   Procedure: LAPAROSCOPIC BILATERAL SALPINGECTOMY;  Surgeon: [name], MD;  Location: [facility] ORS;  Service: Gynecology;  Laterality: Bilateral;   LAPAROSCOPIC SUPRACERVICAL HYSTERECTOMY  11/11/2015   Procedure: LAPAROSCOPIC SUPRACERVICAL HYSTERECTOMY CONVERTED TO OPEN;  Surgeon: [name], MD;  Location: [facility] ORS;  Service: Gynecology;;   LAPAROTOMY  11/11/2015   Procedure: LAPAROTOMY;  Surgeon: [name], MD;  Location: [facility] ORS;  Service: Gynecology;;   VAGINAL DELIVERY     NSVD x 2    Family History  Problem Relation Age of Onset   Cancer Mother 50       breast with a mastectomy that was hormone receptor  and rectal cancer at age 20 with a protectomy, ileostomy and colectomy, multiple polyps   Diabetes Mother  Hypertension Mother    Colon cancer Mother    Breast cancer Mother 55   Osteoporosis Mother    Cancer Father        pancreatic   Colon polyps Father    Alcohol abuse Father    Heart disease Father    Early death Sister 66       suicide   Thyroid  disease Neg Hx     Social History Social History   Tobacco Use   Smoking status: Former    Current packs/day: 0.00    Average packs/day: 1 pack/day for 10.0 years (10.0 ttl pk-yrs)    Types: Cigarettes    Start date: 01/21/1972    Quit  date: 01/20/1982    Years since quitting: 41.8   Smokeless tobacco: Never   Tobacco comments:    quit for 28 years  Vaping Use   Vaping status: Never Used  Substance Use Topics   Alcohol use: Yes    Comment: ~6 drinks per week.   Drug use: No    Allergies  Allergen Reactions   Amlodipine  Other (See Comments) and Swelling    edema   Azithromycin Rash   Hydrochlorothiazide Other (See Comments)    H/o sulfa allergy   Penicillins Other (See Comments)    Tongue swells    Pravachol  [Pravastatin ] Diarrhea and Nausea And Vomiting    Tongue swelling   Sulfa Antibiotics Other (See Comments)    Tongue swells   Ace Inhibitors Other (See Comments)    Would avoid.  She has h/o lip swelling with other meds.    Albuterol      Pulse elevation.   Angiotensin Receptor Blockers Other (See Comments)    Would avoid.  She has h/o lip swelling with other meds.    Clonidine  Derivatives Other (See Comments)    bradycardia   Diphenhydramine Hcl     Benadryl cream causes local skin irritation.  Would avoid med.  She can tolerate zyrtec.    Fentanyl  Other (See Comments)    She felt diffusely awful while on med   Hydrochlorothiazide-Triamterene     Possible hives and GI upset   Hydrocodone     intolerant   Metformin And Related     rash   Metoprolol      Bradycardia.   Nitrofurantoin Other (See Comments)    rash   Oxycodone Other (See Comments)    intolerant   Tetanus Toxoid-Containing Vaccines     Local swelling, can consider split dosing 2 weeks apart   Cefuroxime Axetil Rash    Rash   Levofloxacin In D5w Itching    Severe itching and flushing.   Tetanus Toxoid, Adsorbed Rash    Current Outpatient Medications  Medication Sig Dispense Refill   Cyanocobalamin (B-12) 3000 MCG CAPS Take 1 tablet by mouth daily.     empagliflozin  (JARDIANCE ) 10 MG TABS tablet Take 1 tablet (10 mg total) by mouth every other day.     hydrocortisone  (PROCTOZONE -HC) 2.5 % rectal cream Place 1 Application  rectally 2 (two) times daily as needed for hemorrhoids or anal itching. 30 g 0   Multiple Vitamin (MULTIVITAMIN) tablet Take 1 tablet by mouth daily.     spironolactone  (ALDACTONE ) 50 MG tablet TAKE 1 TABLET(50 MG) BY MOUTH DAILY 90 tablet 3   triamcinolone  cream (KENALOG ) 0.5 % Apply 1 Application topically 2 (two) times daily as needed. 454 g 0   VITAMIN D , CHOLECALCIFEROL, PO Take 4,000 Units by mouth.     No  current facility-administered medications for this visit.     Review of Systems Full ROS  was asked and was negative except for the information on the HPI  Physical Exam Blood pressure (!) 167/100, pulse (!) 109, temperature 99.6 F (37.6 C), temperature source Oral, height 5' 5 (1.651 m), weight 206 lb (93.4 kg), SpO2 96%. CONSTITUTIONAL: NAD chaperone present . EYES: Pupils are equal, round, Sclera are non-icteric. EARS, NOSE, MOUTH AND THROAT: The oropharynx is clear. The oral mucosa is pink and moist. Hearing is intact to voice. LYMPH NODES:  Lymph nodes in the neck are normal. RESPIRATORY:  Lungs are clear. There is normal respiratory effort, with equal breath sounds bilaterally, and without pathologic use of accessory muscles. CARDIOVASCULAR: Heart is regular without murmurs, gallops, or rubs. BREAST: There is a palpable 1.5 x 1 cm mobile lesion at 3:00 a couple of centimeters from the nipple.  There is also biopsy changes within the skin and is mildly tender.  There is no evidence of lymphadenopathy on either axilla.  There is no evidence of any skin or nipple lesions on either breast GI: The abdomen is  soft, nontender, and nondistended. There are no palpable masses. There is no hepatosplenomegaly. There are normal bowel sounds in all quadrants. GU: Rectal deferred.   MUSCULOSKELETAL: Normal muscle strength and tone. No cyanosis or edema.   SKIN: Small small ulcerated lesion within the skin of the left upper quadrant consistent with skin tag NEUROLOGIC: Motor and sensation  is grossly normal. Cranial nerves are grossly intact. PSYCH:  Oriented to person, place and time. Affect is normal.  Data Reviewed I have personally reviewed the patient's imaging, laboratory findings and medical records.    Assessment/Plan 68 year old female with recently diagnosed invasive breast carcinoma on the left side.  Seems to be early-stage.  Pending markers and hormonal receptors.  Discussed with the patient in detail about her disease process.  We talked about the roles for chemotherapy, hormonal therapy, surgical therapy and radiation therapy.  We specifically touch base about the multidisciplinary approach to breast cancer.  I do think that if her receptors are positive probably upfront surgery will be indicated.  She interested in breast preserving surgery.  We initially talked about potential from lumpectomy plus sentinel lymph node biopsy.  The risks, the benefits and the possible complication.  He is in complete agreement.  She will see Dr. Melanee from oncology later this week and I will make another follow-up appointment with her next week and at that time we will decide final surgical therapy. I personally spent a total of 60 minutes in the care of the patient today including performing a medically appropriate exam/evaluation, counseling and educating, placing orders, referring and communicating with other health care professionals, documenting clinical information in the EHR, independently interpreting and reviewing images studies and coordinating care.    Laneta Luna, MD FACS General Surgeon 11/01/2023, 11:49 AM

## 2023-11-02 ENCOUNTER — Ambulatory Visit: Payer: Medicare (Managed Care)

## 2023-11-05 ENCOUNTER — Inpatient Hospital Stay: Payer: Medicare (Managed Care) | Attending: Oncology | Admitting: Oncology

## 2023-11-05 ENCOUNTER — Encounter: Payer: Self-pay | Admitting: Oncology

## 2023-11-05 ENCOUNTER — Inpatient Hospital Stay: Payer: Medicare (Managed Care)

## 2023-11-05 ENCOUNTER — Other Ambulatory Visit: Payer: Self-pay

## 2023-11-05 ENCOUNTER — Other Ambulatory Visit: Payer: Self-pay | Admitting: Surgery

## 2023-11-05 VITALS — BP 164/83 | HR 89 | Temp 97.4°F | Resp 20 | Ht 65.0 in | Wt 206.8 lb

## 2023-11-05 DIAGNOSIS — Z87891 Personal history of nicotine dependence: Secondary | ICD-10-CM | POA: Diagnosis not present

## 2023-11-05 DIAGNOSIS — E119 Type 2 diabetes mellitus without complications: Secondary | ICD-10-CM | POA: Insufficient documentation

## 2023-11-05 DIAGNOSIS — Z7984 Long term (current) use of oral hypoglycemic drugs: Secondary | ICD-10-CM | POA: Diagnosis not present

## 2023-11-05 DIAGNOSIS — C50412 Malignant neoplasm of upper-outer quadrant of left female breast: Secondary | ICD-10-CM | POA: Diagnosis not present

## 2023-11-05 DIAGNOSIS — Z800 Family history of malignant neoplasm of digestive organs: Secondary | ICD-10-CM | POA: Insufficient documentation

## 2023-11-05 DIAGNOSIS — C50912 Malignant neoplasm of unspecified site of left female breast: Secondary | ICD-10-CM

## 2023-11-05 DIAGNOSIS — I10 Essential (primary) hypertension: Secondary | ICD-10-CM | POA: Diagnosis not present

## 2023-11-05 DIAGNOSIS — Z17411 Hormone receptor positive with human epidermal growth factor receptor 2 negative status: Secondary | ICD-10-CM | POA: Insufficient documentation

## 2023-11-05 DIAGNOSIS — Z7189 Other specified counseling: Secondary | ICD-10-CM

## 2023-11-05 DIAGNOSIS — Z803 Family history of malignant neoplasm of breast: Secondary | ICD-10-CM | POA: Diagnosis not present

## 2023-11-05 DIAGNOSIS — R928 Other abnormal and inconclusive findings on diagnostic imaging of breast: Secondary | ICD-10-CM

## 2023-11-05 LAB — GENETIC SCREENING ORDER

## 2023-11-05 NOTE — Progress Notes (Signed)
 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

## 2023-11-06 ENCOUNTER — Encounter: Payer: Self-pay | Admitting: Oncology

## 2023-11-06 DIAGNOSIS — C50412 Malignant neoplasm of upper-outer quadrant of left female breast: Secondary | ICD-10-CM | POA: Insufficient documentation

## 2023-11-06 NOTE — Progress Notes (Signed)
 Hematology/Oncology Consult note [facility] Telephone:([phone] [phone]  Patient Care Team: [name], MD as PCP - General (Family Medicine) Center, [facility] Georgina Shasta POUR, RN as Oncology Nurse Navigator   Name of the patient: Alexandria Fry  [id number]  [birth year]    Reason for referral-new diagnosis of breast cancer   Referring physician-Dr. Arlyss Cleatus  Date of visit: 11/06/23   History of presenting illness-patient Is a 68 year old female who underwent a routine screening mammogram in August 2025 which was followed by diagnostic mammogram and an ultrasound.  This showed 8 x 6 x 6 mm area of concern at the 3 o'clock position 4 cm from the nipple in the left outer breast.  Targeted ultrasound of the left axilla did not show any abnormal axillary lymph nodes.  Patient underwent core biopsy of the breast mass which was consistent with 7 mm grade 2 invasive mammary carcinoma ER 100% positive, PR 100% positive, Ki-67 15% and HER2 -0  Menarche at 14.  She is G2 P2.  Menopause at 51.  She took progesterone for about 1-1/2 years for postmenopausal bleeding and then she had a hysterectomy.  Family history significant for breast cancer in her mother in her 74s who also had colon cancer 5 years later.  ECOG PS- 1  Pain scale- 0   Review of systems- Review of Systems  Constitutional:  Negative for chills, fever, malaise/fatigue and weight loss.  HENT:  Negative for congestion, ear discharge and nosebleeds.   Eyes:  Negative for blurred vision.  Respiratory:  Negative for cough, hemoptysis, sputum production, shortness of breath and wheezing.   Cardiovascular:  Negative for chest pain, palpitations, orthopnea and claudication.  Gastrointestinal:  Negative for abdominal pain, blood in stool, constipation, diarrhea, heartburn, melena, nausea and vomiting.  Genitourinary:  Negative for dysuria, flank pain, frequency, hematuria and urgency.   Musculoskeletal:  Negative for back pain, joint pain and myalgias.  Skin:  Negative for rash.  Neurological:  Negative for dizziness, tingling, focal weakness, seizures, weakness and headaches.  Endo/Heme/Allergies:  Does not bruise/bleed easily.  Psychiatric/Behavioral:  Negative for depression and suicidal ideas. The patient does not have insomnia.     Allergies  Allergen Reactions   Amlodipine  Other (See Comments) and Swelling    edema   Azithromycin Rash   Hydrochlorothiazide Other (See Comments)    H/o sulfa allergy   Penicillins Other (See Comments)    Tongue swells    Pravachol  [Pravastatin ] Diarrhea and Nausea And Vomiting    Tongue swelling   Sulfa Antibiotics Other (See Comments)    Tongue swells   Ace Inhibitors Other (See Comments)    Would avoid.  She has h/o lip swelling with other meds.    Albuterol      Pulse elevation.   Angiotensin Receptor Blockers Other (See Comments)    Would avoid.  She has h/o lip swelling with other meds.    Clonidine  Derivatives Other (See Comments)    bradycardia   Diphenhydramine Hcl     Benadryl cream causes local skin irritation.  Would avoid med.  She can tolerate zyrtec.    Fentanyl  Other (See Comments)    She felt diffusely awful while on med   Hydrochlorothiazide-Triamterene     Possible hives and GI upset   Hydrocodone     intolerant   Metformin And Related     rash   Metoprolol      Bradycardia.   Nitrofurantoin Other (See Comments)  rash   Oxycodone Other (See Comments)    intolerant   Tetanus Toxoid-Containing Vaccines     Local swelling, can consider split dosing 2 weeks apart   Cefuroxime Axetil Rash    Rash   Levofloxacin In D5w Itching    Severe itching and flushing.   Tetanus Toxoid, Adsorbed Rash    Patient Active Problem List   Diagnosis Date Noted   Decreased range of motion of shoulder, left 10/10/2023   Right hip pain 06/13/2023   Healthcare maintenance 02/21/2023   Statin intolerance  02/21/2023   Rash 02/21/2023   Dysuria 02/21/2023   Family history of colorectal cancer 02/25/2022   Adenomatous polyp of colon 02/25/2022   Medicare welcome exam 12/28/2021   Lung nodule 05/19/2021   Respiratory failure with hypoxia (HCC) 05/06/2021   AKI (acute kidney injury) (HCC) 05/06/2021   Hyperandrogenemia 05/27/2020   Multinodular goiter 05/27/2020   History of UTI 05/19/2020   Hammer toe of left foot 06/22/2019   Allergy history, drug 04/10/2018   Leg pain 04/10/2018   Status post laparotomy 11/11/2015   Diabetes mellitus without complication (HCC) 10/18/2015   Hyperglycemia 03/09/2015   Advance care planning 02/07/2014   Hyperlipidemia 02/07/2014   Atypical pneumonia 09/30/2012   Vitamin D  deficiency 10/09/2008   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   Overweight 01/18/2007   Essential hypertension 01/18/2007   Allergic rhinitis 11/29/2006   FIBROCYSTIC BREAST DISEASE 11/29/2006     Past Medical History:  Diagnosis Date   Allergic rhinitis    Breast cancer (HCC)    Diabetes mellitus without complication (HCC)    Difficult intubation    Fatty liver    GERD (gastroesophageal reflux disease)    Hypertension    Irregular menses    with neg endometrial biopsy 2013   Menorrhagia    Murmur, cardiac    Pneumonia    2014   PONV (postoperative nausea and vomiting)      Past Surgical History:  Procedure Laterality Date   ABDOMINAL HYSTERECTOMY     BREAST BIOPSY Left 10/28/2023   u/s bx heart clip path pending   BREAST BIOPSY Left 10/28/2023   US  LT BREAST BX W LOC DEV 1ST LESION IMG BX SPEC US  GUIDE 10/28/2023 [facility]   CERVICAL CONE BIOPSY     COLONOSCOPY WITH PROPOFOL  N/A 01/18/2018   Procedure: COLONOSCOPY WITH PROPOFOL ;  Surgeon: [name], MD;  Location: [facility] ENDOSCOPY;  Service: Endoscopy;  Laterality: N/A;   COLONOSCOPY WITH PROPOFOL  N/A 02/25/2022   Procedure: COLONOSCOPY WITH PROPOFOL ;  Surgeon: [name], MD;  Location: [facility] END[facility]COPY;   Service: Gastroenterology;  Laterality: N/A;   CYSTOSCOPY  11/11/2015   Procedure: CYSTOSCOPY;  Surgeon: [name], MD;  Location: [facility] ORS;  Service: Gynecology;;   DILATATION & CURETTAGE/HYSTEROSCOPY WITH MYOSURE N/A 03/21/2015   Procedure: DILATATION & CURETTAGE/HYSTEROSCOPY, POLYPECTOMY;  Surgeon: [name], MD;  Location: [facility] ORS;  Service: Gynecology;  Laterality: N/A;   DILATION AND CURETTAGE OF UTERUS  1981   [facility]    DILATION AND CURETTAGE OF UTERUS  06/25/2009   normal (Dr. [name])   LAPAROSCOPIC BILATERAL SALPINGECTOMY Bilateral 11/11/2015   Procedure: LAPAROSCOPIC BILATERAL SALPINGECTOMY;  Surgeon: [name], MD;  Location: [facility] ORS;  Service: Gynecology;  Laterality: Bilateral;   LAPAROSCOPIC SUPRACERVICAL HYSTERECTOMY  11/11/2015   Procedure: LAPAROSCOPIC SUPRACERVICAL HYSTERECTOMY CONVERTED TO OPEN;  Surgeon: [name], MD;  Location: [facility] ORS;  Service: Gynecology;;   LAPAROTOMY  11/11/2015   Procedure: LAPAROTOMY;  Surgeon: [name], MD;  Location: [facility] ORS;  Service: Gynecology;;   VAGINAL DELIVERY     NSVD x 2    Social History   Socioeconomic History   Marital status: Married    Spouse name: Not on file   Number of children: 2   Years of education: Not on file   Highest education level: Associate degree: academic program  Occupational History    Employer: [employer]    Comment: Buyer  Tobacco Use   Smoking status: Former    Current packs/day: 0.00    Average packs/day: 1 pack/day for 10.0 years (10.0 ttl pk-yrs)    Types: Cigarettes    Start date: 01/21/1972    Quit date: 01/20/1982    Years since quitting: 41.8   Smokeless tobacco: Never   Tobacco comments:    quit for 28 years  Vaping Use   Vaping status: Never Used  Substance and Sexual Activity   Alcohol use: Yes    Comment: ~6 drinks per week.   Drug use: No   Sexual activity: Yes  Other Topics Concern   Not on file  Social History Narrative   Lives with  husband, 81   1 son at home   1 son out of the house   Social Drivers of Health   Financial Resource Strain: Low Risk  (08/12/2023)   Overall Financial Resource Strain (CARDIA)    Difficulty of Paying Living Expenses: Not hard at all  Food Insecurity: No Food Insecurity (11/05/2023)   Hunger Vital Sign    Worried About Running Out of Food in the Last Year: Never true    Ran Out of Food in the Last Year: Never true  Transportation Needs: No Transportation Needs (11/05/2023)   PRAPARE - Administrator, Civil Service (Medical): No    Lack of Transportation (Non-Medical): No  Physical Activity: Insufficiently Active (08/12/2023)   Exercise Vital Sign    Days of Exercise per Week: 3 days    Minutes of Exercise per Session: 40 min  Stress: No Stress Concern Present (08/12/2023)   [employer] of Occupational Health - Occupational Stress Questionnaire    Feeling of Stress: Only a little  Social Connections: Socially Isolated (08/12/2023)   Social Connection and Isolation Panel    Frequency of Communication with Friends and Family: Once a week    Frequency of Social Gatherings with Friends and Family: Once a week    Attends Religious Services: Never    Database administrator or Organizations: No    Attends Engineer, structural: Not on file    Marital Status: Married  Catering manager Violence: Not At Risk (11/05/2023)   Humiliation, Afraid, Rape, and Kick questionnaire    Fear of Current or Ex-Partner: No    Emotionally Abused: No    Physically Abused: No    Sexually Abused: No     Family History  Problem Relation Age of Onset   Cancer Mother 19       breast with a mastectomy that was hormone receptor  and rectal cancer at age 20 with a protectomy, ileostomy and colectomy, multiple polyps   Diabetes Mother    Hypertension Mother    Colon cancer Mother    Breast cancer Mother 81   Osteoporosis Mother    Cancer Father        pancreatic   Colon polyps  Father    Alcohol abuse Father    Heart disease Father  Early death Sister 21       suicide   Thyroid  disease Neg Hx      Current Outpatient Medications:    Cyanocobalamin (B-12) 3000 MCG CAPS, Take 1 tablet by mouth daily., Disp: , Rfl:    empagliflozin  (JARDIANCE ) 10 MG TABS tablet, Take 1 tablet (10 mg total) by mouth every other day., Disp: , Rfl:    hydrocortisone  (PROCTOZONE -HC) 2.5 % rectal cream, Place 1 Application rectally 2 (two) times daily as needed for hemorrhoids or anal itching., Disp: 30 g, Rfl: 0   Multiple Vitamin (MULTIVITAMIN) tablet, Take 1 tablet by mouth daily., Disp: , Rfl:    spironolactone  (ALDACTONE ) 50 MG tablet, TAKE 1 TABLET(50 MG) BY MOUTH DAILY, Disp: 90 tablet, Rfl: 3   triamcinolone  cream (KENALOG ) 0.5 %, Apply 1 Application topically 2 (two) times daily as needed., Disp: 454 g, Rfl: 0   VITAMIN D , CHOLECALCIFEROL, PO, Take 4,000 Units by mouth., Disp: , Rfl:    Physical exam:  Vitals:   11/05/23 1448  BP: (!) 164/83  Pulse: 89  Resp: 20  Temp: (!) 97.4 F (36.3 C)  TempSrc: Tympanic  SpO2: 96%  Weight: 206 lb 12.8 oz (93.8 kg)  Height: 5' 5 (1.651 m)   Physical Exam Cardiovascular:     Rate and Rhythm: Normal rate and regular rhythm.     Heart sounds: Normal heart sounds.  Pulmonary:     Effort: Pulmonary effort is normal.     Breath sounds: Normal breath sounds.  Abdominal:     General: Bowel sounds are normal.     Palpations: Abdomen is soft.  Skin:    General: Skin is warm and dry.  Neurological:     Mental Status: She is alert and oriented to person, place, and time.     Breast exam: There is a pea-sized area of induration palpable in the left upper outer quadrant at the 4 o'clock position.  No palpable bilateral axillary adenopathy.  No palpable masses in the right breast.      Latest Ref Rng & Units 01/26/2023    9:09 AM  CMP  Glucose 70 - 99 mg/dL 838   BUN 6 - 23 mg/dL 19   Creatinine 9.59 - 1.20 mg/dL 8.83    Sodium 864 - 854 mEq/L 137   Potassium 3.5 - 5.1 mEq/L 4.3   Chloride 96 - 112 mEq/L 101   CO2 19 - 32 mEq/L 28   Calcium 8.4 - 10.5 mg/dL 9.9   Total Protein 6.0 - 8.3 g/dL 7.2   Total Bilirubin 0.2 - 1.2 mg/dL 0.7   Alkaline Phos 39 - 117 U/L 59   AST 0 - 37 U/L 22   ALT 0 - 35 U/L 21       Latest Ref Rng & Units 01/26/2023    9:09 AM  CBC  WBC 4.0 - 10.5 K/uL 8.4   Hemoglobin 12.0 - 15.0 g/dL 84.8   Hematocrit 63.9 - 46.0 % 46.1   Platelets 150.0 - 400.0 K/uL 214.0     No images are attached to the encounter.  US  LT BREAST BX W LOC DEV 1ST LESION IMG BX SPEC US  GUIDE Addendum Date: 10/29/2023 ADDENDUM REPORT: 10/29/2023 15:08 ADDENDUM: PATHOLOGY revealed: 1. Breast, left, needle core biopsy, 3:00 4 cmfn (heart clip)- INVASIVE DUCTAL CARCINOMA OVERALL GRADE: 2. LYMPHOVASCULAR INVASION: NOT IDENTIFIED. CANCER LENGTH: 7 MM / 0.7 CM. CALCIFICATIONS: NOT IDENTIFIED Pathology results are CONCORDANT with imaging findings, per Dr. Corean Salter. Pathology  results and recommendations were discussed with patient via telephone on 10/29/23 by Rock Hover RN. Patient reported biopsy site doing well with no adverse symptoms, and only slight tenderness at the site. Post biopsy care instructions were reviewed, questions were answered and my direct phone number was provided. Patient was instructed to call [facility] for any additional questions or concerns related to biopsy site. RECOMMENDATIONS: 1. Surgical and oncological consultation. Request for surgical and oncological consultation relayed to Shasta Ada RN at [facility] by Rock Hover RN on 10/29/2023. Pathology results reported by Rock Hover RN on 10/29/2023. Electronically Signed   By: Corean Salter M.D.   On: 10/29/2023 15:08   Result Date: 10/29/2023 CLINICAL DATA:  Indeterminate LEFT breast mass EXAM: ULTRASOUND GUIDED LEFT BREAST CORE NEEDLE BIOPSY COMPARISON:  Previous exam(s). PROCEDURE: I met with the  patient and we discussed the procedure of ultrasound-guided biopsy, including benefits and alternatives. We discussed the high likelihood of a successful procedure. We discussed the risks of the procedure, including infection, bleeding, tissue injury, clip migration, and inadequate sampling. Informed written consent was given. The usual time-out protocol was performed immediately prior to the procedure. Lesion quadrant: Upper outer quadrant Using sterile technique and 1% lidocaine  and 1% lidocaine  with epinephrine  as local anesthetic, under direct ultrasound visualization, a 14 gauge spring-loaded device was used to perform biopsy of a mass at 3 o'clock 4 cm from nipple using a lateral approach. At the conclusion of the procedure a heart shaped tissue marker clip was deployed into the biopsy cavity. Follow up 2 view mammogram was performed and dictated separately. IMPRESSION: Ultrasound guided biopsy of a mass at 3 o'clock. No apparent complications. Electronically Signed: By: Corean Salter M.D. On: 10/28/2023 14:04   MM CLIP PLACEMENT LEFT Result Date: 10/28/2023 CLINICAL DATA:  Status post ultrasound-guided biopsy EXAM: 3D DIAGNOSTIC LEFT MAMMOGRAM POST ULTRASOUND BIOPSY COMPARISON:  Previous exam(s). ACR Breast Density Category b: There are scattered areas of fibroglandular density. FINDINGS: 3D Mammographic images were obtained following ultrasound guided biopsy of a LEFT breast mass. The heart shaped biopsy marking clip is in expected position at the site of biopsy. This is at the site of screening mammographic concern. IMPRESSION: Appropriate positioning of the heart shaped biopsy marking clip at the site of biopsy in the outer breast. Final Assessment: Post Procedure Mammograms for Marker Placement Electronically Signed   By: Corean Salter M.D.   On: 10/28/2023 14:03   MM 3D DIAGNOSTIC MAMMOGRAM UNILATERAL LEFT BREAST Result Date: 10/21/2023 CLINICAL DATA:  LEFT asymmetry callback. EXAM:  DIGITAL DIAGNOSTIC UNILATERAL LEFT MAMMOGRAM WITH TOMOSYNTHESIS AND CAD; ULTRASOUND LEFT BREAST LIMITED TECHNIQUE: Left digital diagnostic mammography and breast tomosynthesis was performed. The images were evaluated with computer-aided detection. ; Targeted ultrasound examination of the left breast was performed. COMPARISON:  Previous exam(s). ACR Breast Density Category b: There are scattered areas of fibroglandular density. FINDINGS: Spot compression tomosynthesis views demonstrate effacement of the finding on CC view. On spot MLO view, there is a persistent asymmetry noted on slice 33. The favored correlate is on ML slice 45. This partially effaces on spot ML. On physical exam, no suspicious mass is appreciated. Targeted ultrasound was performed of the LEFT outer breast. At 3 o'clock 4 cm from the nipple there is an oval hypoechoic mass with angular margins. It is possibly intraductal in location. It measures 8 x 6 x 6 mm. This is favored to correspond to the site of mammographic concern. Targeted ultrasound was performed of  the LEFT axilla. No suspicious axillary lymph nodes are visualized. IMPRESSION: 1. There is an indeterminate 8 mm mass in the LEFT outer breast. Recommend ultrasound-guided biopsy for definitive characterization. 2. No suspicious LEFT axillary adenopathy. RECOMMENDATION: LEFT breast ultrasound-guided biopsy x1 I have discussed the findings and recommendations with the patient. The biopsy procedure was discussed with the patient and questions were answered. Patient expressed their understanding of the biopsy recommendation. Patient will be scheduled for biopsy at her earliest convenience by the schedulers. Ordering provider will be notified. If applicable, a reminder letter will be sent to the patient regarding the next appointment. BI-RADS CATEGORY  4: Suspicious. Electronically Signed   By: Corean Salter M.D.   On: 10/21/2023 16:42   US  LIMITED ULTRASOUND INCLUDING AXILLA LEFT BREAST   Result Date: 10/21/2023 CLINICAL DATA:  LEFT asymmetry callback. EXAM: DIGITAL DIAGNOSTIC UNILATERAL LEFT MAMMOGRAM WITH TOMOSYNTHESIS AND CAD; ULTRASOUND LEFT BREAST LIMITED TECHNIQUE: Left digital diagnostic mammography and breast tomosynthesis was performed. The images were evaluated with computer-aided detection. ; Targeted ultrasound examination of the left breast was performed. COMPARISON:  Previous exam(s). ACR Breast Density Category b: There are scattered areas of fibroglandular density. FINDINGS: Spot compression tomosynthesis views demonstrate effacement of the finding on CC view. On spot MLO view, there is a persistent asymmetry noted on slice 33. The favored correlate is on ML slice 45. This partially effaces on spot ML. On physical exam, no suspicious mass is appreciated. Targeted ultrasound was performed of the LEFT outer breast. At 3 o'clock 4 cm from the nipple there is an oval hypoechoic mass with angular margins. It is possibly intraductal in location. It measures 8 x 6 x 6 mm. This is favored to correspond to the site of mammographic concern. Targeted ultrasound was performed of the LEFT axilla. No suspicious axillary lymph nodes are visualized. IMPRESSION: 1. There is an indeterminate 8 mm mass in the LEFT outer breast. Recommend ultrasound-guided biopsy for definitive characterization. 2. No suspicious LEFT axillary adenopathy. RECOMMENDATION: LEFT breast ultrasound-guided biopsy x1 I have discussed the findings and recommendations with the patient. The biopsy procedure was discussed with the patient and questions were answered. Patient expressed their understanding of the biopsy recommendation. Patient will be scheduled for biopsy at her earliest convenience by the schedulers. Ordering provider will be notified. If applicable, a reminder letter will be sent to the patient regarding the next appointment. BI-RADS CATEGORY  4: Suspicious. Electronically Signed   By: Corean Salter M.D.   On:  10/21/2023 16:42   DG Shoulder Left Result Date: 10/19/2023 CLINICAL DATA:  Decreased range of motion of the LEFT shoulder EXAM: LEFT SHOULDER - 2+ VIEW COMPARISON:  September 02, 2021 chest CT FINDINGS: No acute fracture or dislocation. Mild-to-moderate joint space narrowing and osteophyte formation of the acromioclavicular joint. Mild osteophyte formation and joint space narrowing of the glenohumeral joint. No area of erosion or osseous destruction. No unexpected radiopaque foreign body. Soft tissues are unremarkable. IMPRESSION: Mild-to-moderate degenerative changes of the acromioclavicular and glenohumeral joints. Electronically Signed   By: Corean Salter M.D.   On: 10/19/2023 12:02   MM 3D SCREENING MAMMOGRAM BILATERAL BREAST Result Date: 10/19/2023 CLINICAL DATA:  Screening. EXAM: DIGITAL SCREENING BILATERAL MAMMOGRAM WITH TOMOSYNTHESIS AND CAD TECHNIQUE: Bilateral screening digital craniocaudal and mediolateral oblique mammograms were obtained. Bilateral screening digital breast tomosynthesis was performed. The images were evaluated with computer-aided detection. COMPARISON:  Previous exam(s). ACR Breast Density Category b: There are scattered areas of fibroglandular density. FINDINGS: In the left breast,  a possible asymmetry warrants further evaluation. In the right breast, no findings suspicious for malignancy. IMPRESSION: Further evaluation is suggested for possible asymmetry in the left breast. RECOMMENDATION: Diagnostic mammogram and possibly ultrasound of the left breast. (Code:[id number]) The patient will be contacted regarding the findings, and additional imaging will be scheduled. BI-RADS CATEGORY  0: Incomplete: Need additional imaging evaluation. Electronically Signed   By: Corean Salter M.D.   On: 10/19/2023 11:44   DG Bone Density Result Date: 10/14/2023 EXAM: DUAL X-RAY ABSORPTIOMETRY (DXA) FOR BONE MINERAL DENSITY 10/13/2023 3:02 pm CLINICAL DATA:  68 year old Female Postmenopausal.  Screening for osteoporosis TECHNIQUE: An axial (e.g., hips, spine) and/or appendicular (e.g., radius) exam was performed, as appropriate, using GE Secretary/administrator at [facility]. Images are obtained for bone mineral density measurement and are not obtained for diagnostic purposes. [id number] Exclusions: Lumbar spine due to degenerative changes. COMPARISON:  None. FINDINGS: Scan quality: Good. LEFT FEMORAL NECK: BMD (in g/cm2): 0.858 T-score: -1.3 Z-score: 0.3 LEFT TOTAL HIP: BMD (in g/cm2): 0.994 T-score: -0.1 Z-score: 1.2 RIGHT FEMORAL NECK: BMD (in g/cm2): 0.902 T-score: -1.0 Z-score: 0.6 RIGHT TOTAL HIP: BMD (in g/cm2): 1.017 T-score: 0.1 Z-score: 1.4 LEFT FOREARM (RADIUS 33%): BMD (in g/cm2): 0.845 T-score: -0.4 Z-score: 1.2 FRAX 10-YEAR PROBABILITY OF FRACTURE: 10-year fracture risk is performed using the [facility] FRAX calculator based on patient-reported risk factors. Major osteoporotic fracture: 8.5% Hip fracture: 0.9% Other situations known to alter the reliability of the FRAX score should be considered when making treatment decisions, including chronic glucocorticoid use and past treatments. Further guidance on treatment can be found at the [facility] Osteoporosis Foundation's website [email or web address]/. IMPRESSION: Osteopenia based on BMD. Fracture risk is increased. Increased risk is based on low BMD. RECOMMENDATIONS: 1. All patients should optimize calcium and vitamin D  intake. 2. Consider FDA-approved medical therapies in postmenopausal women and men aged 53 years and older, based on the following: - A hip or vertebral (clinical or morphometric) fracture - T-score less than or equal to -2.5 and secondary causes have been excluded. - Low bone mass (T-score between -1.0 and -2.5) and a 10-year probability of a hip fracture greater than or equal to 3% or a 10-year probability of a major osteoporosis-related fracture greater than or equal to 20% based on the [place] -adapted  WHO algorithm. - Clinician judgment and/or patient preferences may indicate treatment for people with 10-year fracture probabilities above or below these levels 3. Patients with diagnosis of osteoporosis or at high risk for fracture should have regular bone mineral density tests. For patients eligible for Medicare, routine testing is allowed once every 2 years. The testing frequency can be increased to one year for patients who have rapidly progressing disease, those who are receiving or discontinuing medical therapy to restore bone mass, or have additional risk factors. Electronically Signed   By: Reyes Phi M.D.   On: 10/14/2023 13:36    Assessment and plan- Patient is a 68 y.o. female with clinically prognostic stage Ia invasive mammary carcinoma of the left breast cT1b N0 M0 ER 100% positive PR 100% positive HER2 negative here to discuss further management  Discussed the results of ultrasound and mammogram with the patient in detail which shows a an 8 mm mass in the left upper outer quadrant of the breast.  Left axillary lymph nodes appeared normal.  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  At this point patient would  need upfront lumpectomy with sentinel lymph node biopsy.  She would benefit from postlumpectomy radiation as well.  She would benefit from Oncotype testing given that her tumor is more than 5 mm and grade 2.  We will decide about which specimen to send for Oncotype depending on the size of the final pathology specimen.  Discussed what Oncotype testing is and how the results are interpreted.  Given that she is more than 68 years of age and postmenopausal adjuvant chemotherapy would be indicated only if Oncotype score is 26 or higher.  Broadly speaking there is AC Taxol versus TC chemotherapy that can be offered in the adjuvant setting and that will depend on her Oncotype score.  Patient's tumor is ER/PR positive and she would  also benefit from adjuvant endocrine therapy which she will start upon completion of radiation therapy and I will discuss this in more detail down the line.  Treatment will be given with a curative intent.  We will be sending blood out for genetic testing today.  I will tentatively see her in mid October after her lumpectomy to discuss final pathology results and Oncotype testing results and further management   Cancer Staging  Malignant neoplasm of upper-outer quadrant of left breast in female, estrogen receptor positive (HCC) Staging form: Breast, AJCC 8th Edition - Clinical stage from 11/06/2023: Stage IA (cT1b, cN0(f), cM0, G2, ER+, PR+, HER2-) - Signed by Melanee Annah BROCKS, MD on 11/06/2023 Method of lymph node assessment: Core biopsy Histologic grading system: 3 grade system     Thank you for this kind referral and the opportunity to participate in the care of this  Patient   Visit Diagnosis 1. Invasive ductal carcinoma of breast, female, left (HCC)     Dr. Annah Melanee, MD, MPH [facility] [phone] 11/06/2023

## 2023-11-08 ENCOUNTER — Encounter: Payer: Self-pay | Admitting: Surgery

## 2023-11-08 ENCOUNTER — Telehealth: Payer: Self-pay | Admitting: Surgery

## 2023-11-08 ENCOUNTER — Ambulatory Visit: Payer: Medicare (Managed Care) | Admitting: Surgery

## 2023-11-08 VITALS — BP 140/80 | HR 82 | Ht 65.0 in | Wt 204.0 lb

## 2023-11-08 DIAGNOSIS — C50412 Malignant neoplasm of upper-outer quadrant of left female breast: Secondary | ICD-10-CM | POA: Diagnosis not present

## 2023-11-08 DIAGNOSIS — Z170 Estrogen receptor positive status [ER+]: Secondary | ICD-10-CM | POA: Diagnosis not present

## 2023-11-08 DIAGNOSIS — D235 Other benign neoplasm of skin of trunk: Secondary | ICD-10-CM

## 2023-11-08 NOTE — H&P (View-Only) (Signed)
 Outpatient Surgical Follow Up  11/08/2023  Alexandria Fry is an 68 y.o. female.   Chief Complaint  Patient presents with   Pre-op Exam    HPI: Alexandria Fry is a 68 y.o. female seen inf/u for a newly diagnosed breast cancer on the left side.  She denies any palpable lesions.  No fevers no chills no weight loss.  Specifically denies any breast discharge. Er PR + Routine mammogram pers reviewed a suspicious lesion was seen on the left side, she did have an ultrasound showed a lesion at 3:00 and a biopsy performed showing evidence of invasive breast cancer.  Pending hormonal receptors. She is able to perform more than 4 METS of activity without shortness of breath. He does have a history of arthritis and has been working with physical therapy. Abd Hysterectomy ETOH daily, she will quit Quit smoking several decades ago Mother breast CA  ( DCIS) around 28 she was postmenopausal Father pancreatic CA apparently treated at [facility]  ( sarcomatous cancer) SHe was hospitalized for pneumonia recently  No prior breast biopsies Last menstrual period age 30 G29P2M1 + birth control She saw oncology and we all agree upon upfront surgery She is accompanied by her husvband  Past Medical History:  Diagnosis Date   Allergic rhinitis    Breast cancer (HCC)    Diabetes mellitus without complication (HCC)    Difficult intubation    Fatty liver    GERD (gastroesophageal reflux disease)    Hypertension    Irregular menses    with neg endometrial biopsy 2013   Menorrhagia    Murmur, cardiac    Pneumonia    2014   PONV (postoperative nausea and vomiting)     Past Surgical History:  Procedure Laterality Date   ABDOMINAL HYSTERECTOMY     BREAST BIOPSY Left 10/28/2023   u/s bx heart clip path pending   BREAST BIOPSY Left 10/28/2023   US  LT BREAST BX W LOC DEV 1ST LESION IMG BX SPEC US  GUIDE 10/28/2023 [facility]   CERVICAL CONE BIOPSY     COLONOSCOPY WITH PROPOFOL  N/A  01/18/2018   Procedure: COLONOSCOPY WITH PROPOFOL ;  Surgeon: [name], MD;  Location: [facility] ENDOSCOPY;  Service: Endoscopy;  Laterality: N/A;   COLONOSCOPY WITH PROPOFOL  N/A 02/25/2022   Procedure: COLONOSCOPY WITH PROPOFOL ;  Surgeon: [name], MD;  Location: [facility] ENDOSCOPY;  Service: Gastroenterology;  Laterality: N/A;   CYSTOSCOPY  11/11/2015   Procedure: CYSTOSCOPY;  Surgeon: [name], MD;  Location: [facility] ORS;  Service: Gynecology;;   DILATATION & CURETTAGE/HYSTEROSCOPY WITH MYOSURE N/A 03/21/2015   Procedure: DILATATION & CURETTAGE/HYSTEROSCOPY, POLYPECTOMY;  Surgeon: [name], MD;  Location: [facility] ORS;  Service: Gynecology;  Laterality: N/A;   DILATION AND CURETTAGE OF UTERUS  1981   [facility]    DILATION AND CURETTAGE OF UTERUS  06/25/2009   normal (Dr. [name])   LAPAROSCOPIC BILATERAL SALPINGECTOMY Bilateral 11/11/2015   Procedure: LAPAROSCOPIC BILATERAL SALPINGECTOMY;  Surgeon: [name], MD;  Location: [facility] ORS;  Service: Gynecology;  Laterality: Bilateral;   LAPAROSCOPIC SUPRACERVICAL HYSTERECTOMY  11/11/2015   Procedure: LAPAROSCOPIC SUPRACERVICAL HYSTERECTOMY CONVERTED TO OPEN;  Surgeon: [name], MD;  Location: [facility] ORS;  Service: Gynecology;;   LAPAROTOMY  11/11/2015   Procedure: LAPAROTOMY;  Surgeon: [name], MD;  Location: [facility] ORS;  Service: Gynecology;;   VAGINAL DELIVERY     NSVD x 2    Family History  Problem Relation Age of Onset  Cancer Mother 19       breast with a mastectomy that was hormone receptor  and rectal cancer at age 35 with a protectomy, ileostomy and colectomy, multiple polyps   Diabetes Mother    Hypertension Mother    Colon cancer Mother    Breast cancer Mother 26   Osteoporosis Mother    Cancer Father        pancreatic   Colon polyps Father    Alcohol abuse Father    Heart disease Father    Early death Sister 18       suicide   Thyroid  disease Neg Hx     Social History:  reports that she quit  smoking about 41 years ago. Her smoking use included cigarettes. She started smoking about 51 years ago. She has a 10 pack-year smoking history. She has been exposed to tobacco smoke. She has never used smokeless tobacco. She reports current alcohol use. She reports that she does not use drugs.  Allergies:  Allergies  Allergen Reactions   Amlodipine  Other (See Comments) and Swelling    edema   Azithromycin Rash   Hydrochlorothiazide Other (See Comments)    H/o sulfa allergy   Penicillins Other (See Comments)    Tongue swells    Pravachol  [Pravastatin ] Diarrhea and Nausea And Vomiting    Tongue swelling   Sulfa Antibiotics Other (See Comments)    Tongue swells   Ace Inhibitors Other (See Comments)    Would avoid.  She has h/o lip swelling with other meds.    Albuterol      Pulse elevation.   Angiotensin Receptor Blockers Other (See Comments)    Would avoid.  She has h/o lip swelling with other meds.    Clonidine  Derivatives Other (See Comments)    bradycardia   Diphenhydramine Hcl     Benadryl cream causes local skin irritation.  Would avoid med.  She can tolerate zyrtec.    Fentanyl  Other (See Comments)    She felt diffusely awful while on med   Hydrochlorothiazide-Triamterene     Possible hives and GI upset   Hydrocodone     intolerant   Metformin And Related     rash   Metoprolol      Bradycardia.   Nitrofurantoin Other (See Comments)    rash   Oxycodone Other (See Comments)    intolerant   Tetanus Toxoid-Containing Vaccines     Local swelling, can consider split dosing 2 weeks apart   Cefuroxime Axetil Rash    Rash   Levofloxacin In D5w Itching    Severe itching and flushing.   Tetanus Toxoid, Adsorbed Rash    Medications reviewed.    ROS Full ROS performed and is otherwise negative other than what is stated in HPI   BP (!) 140/80   Pulse 82   Ht 5' 5 (1.651 m)   Wt 204 lb (92.5 kg)   LMP  (LMP Unknown)   SpO2 98%   BMI 33.95 kg/m   Physical  Exam Vitals and nursing note reviewed. Exam conducted with a chaperone present.  Constitutional:      Appearance: Normal appearance. She is not ill-appearing.  Neck:     Vascular: No carotid bruit.  Cardiovascular:     Rate and Rhythm: Normal rate and regular rhythm.     Heart sounds: No murmur heard. Pulmonary:     Effort: Pulmonary effort is normal.     Breath sounds: Normal breath sounds. No stridor.  Comments:   Breast exam: There is a pea-sized area of induration palpable in the left upper outer quadrant at the 4 o'clock position.  No palpable bilateral axillary adenopathy.  No palpable masses in the right breast. She does have a 5 mm skin tag 5 mm below left mammary fold Abdominal:     General: Abdomen is flat. There is no distension.     Palpations: Abdomen is soft. There is no mass.     Tenderness: There is no abdominal tenderness. There is no guarding or rebound.     Hernia: No hernia is present.  Musculoskeletal:        General: No swelling or tenderness. Normal range of motion.     Cervical back: Normal range of motion and neck supple. No rigidity or tenderness.  Lymphadenopathy:     Cervical: No cervical adenopathy.  Skin:    General: Skin is warm and dry.     Capillary Refill: Capillary refill takes less than 2 seconds.  Neurological:     General: No focal deficit present.     Mental Status: She is alert and oriented to person, place, and time.  Psychiatric:        Mood and Affect: Mood normal.        Behavior: Behavior normal.        Thought Content: Thought content normal.        Judgment: Judgment normal.     Assessment/Plan: 69 year old female with recently diagnosed invasive breast carcinoma on the left side. ER and PR +   Seems to be early-stage.   Discussed with the patient in detail about her disease process.  We talked about the roles for chemotherapy, hormonal therapy, surgical therapy and radiation therapy.  We specifically touch base about the  multidisciplinary approach to breast cancer.  I do think that  upfront surgery will be indicated.  She interested in breast preserving surgery.  We initially talked about potential from lumpectomy plus sentinel lymph node biopsy.  The risks, the benefits and the possible complication.  We talked about the R, B and possible complications of surgery including but not limited to bleeding, infection, lymphedema, breast deformity, margin positivity and the potential need for additional surgery. They understand and are in agreement She also wishes to have excised a benign skin lesion on her left chest wall I personally spent a total of 40 minutes in the care of the patient today including performing a medically appropriate exam/evaluation, counseling and educating, placing orders, referring and communicating with other health care professionals, documenting clinical information in the EHR, independently interpreting and reviewing images studies and coordinating care.   Laneta Luna, MD [facility] General Surgeon

## 2023-11-08 NOTE — Telephone Encounter (Signed)
 Patient has been advised of Pre-Admission date/time, and Surgery date at [facility].  Surgery Date: 11/11/23 Preadmission Testing Date: 11/09/23 (phone 8a-1p)  Patient to arrive at 7:30 am at [facility] as will be having SLN bx done first prior to surgery.    Patient also scheduled at [facility] for breast tag placement 11/09/23.    Patient informed of the scheduling process and surgery information is given at time of office visit on 11/08/23.

## 2023-11-08 NOTE — Progress Notes (Signed)
 Outpatient Surgical Follow Up  11/08/2023  Alexandria Fry is an 68 y.o. female.   Chief Complaint  Patient presents with   Pre-op Exam    HPI: Alexandria Fry is a 68 y.o. female seen inf/u for a newly diagnosed breast cancer on the left side.  She denies any palpable lesions.  No fevers no chills no weight loss.  Specifically denies any breast discharge. Er PR + Routine mammogram pers reviewed a suspicious lesion was seen on the left side, she did have an ultrasound showed a lesion at 3:00 and a biopsy performed showing evidence of invasive breast cancer.  Pending hormonal receptors. She is able to perform more than 4 METS of activity without shortness of breath. He does have a history of arthritis and has been working with physical therapy. Abd Hysterectomy ETOH daily, she will quit Quit smoking several decades ago Mother breast CA  ( DCIS) around 28 she was postmenopausal Father pancreatic CA apparently treated at [facility]  ( sarcomatous cancer) SHe was hospitalized for pneumonia recently  No prior breast biopsies Last menstrual period age 30 G29P2M1 + birth control She saw oncology and we all agree upon upfront surgery She is accompanied by her husvband  Past Medical History:  Diagnosis Date   Allergic rhinitis    Breast cancer (HCC)    Diabetes mellitus without complication (HCC)    Difficult intubation    Fatty liver    GERD (gastroesophageal reflux disease)    Hypertension    Irregular menses    with neg endometrial biopsy 2013   Menorrhagia    Murmur, cardiac    Pneumonia    2014   PONV (postoperative nausea and vomiting)     Past Surgical History:  Procedure Laterality Date   ABDOMINAL HYSTERECTOMY     BREAST BIOPSY Left 10/28/2023   u/s bx heart clip path pending   BREAST BIOPSY Left 10/28/2023   US  LT BREAST BX W LOC DEV 1ST LESION IMG BX SPEC US  GUIDE 10/28/2023 [facility]   CERVICAL CONE BIOPSY     COLONOSCOPY WITH PROPOFOL  N/A  01/18/2018   Procedure: COLONOSCOPY WITH PROPOFOL ;  Surgeon: [name], MD;  Location: [facility] ENDOSCOPY;  Service: Endoscopy;  Laterality: N/A;   COLONOSCOPY WITH PROPOFOL  N/A 02/25/2022   Procedure: COLONOSCOPY WITH PROPOFOL ;  Surgeon: [name], MD;  Location: [facility] ENDOSCOPY;  Service: Gastroenterology;  Laterality: N/A;   CYSTOSCOPY  11/11/2015   Procedure: CYSTOSCOPY;  Surgeon: [name], MD;  Location: [facility] ORS;  Service: Gynecology;;   DILATATION & CURETTAGE/HYSTEROSCOPY WITH MYOSURE N/A 03/21/2015   Procedure: DILATATION & CURETTAGE/HYSTEROSCOPY, POLYPECTOMY;  Surgeon: [name], MD;  Location: [facility] ORS;  Service: Gynecology;  Laterality: N/A;   DILATION AND CURETTAGE OF UTERUS  1981   [facility]    DILATION AND CURETTAGE OF UTERUS  06/25/2009   normal (Dr. [name])   LAPAROSCOPIC BILATERAL SALPINGECTOMY Bilateral 11/11/2015   Procedure: LAPAROSCOPIC BILATERAL SALPINGECTOMY;  Surgeon: [name], MD;  Location: [facility] ORS;  Service: Gynecology;  Laterality: Bilateral;   LAPAROSCOPIC SUPRACERVICAL HYSTERECTOMY  11/11/2015   Procedure: LAPAROSCOPIC SUPRACERVICAL HYSTERECTOMY CONVERTED TO OPEN;  Surgeon: [name], MD;  Location: [facility] ORS;  Service: Gynecology;;   LAPAROTOMY  11/11/2015   Procedure: LAPAROTOMY;  Surgeon: [name], MD;  Location: [facility] ORS;  Service: Gynecology;;   VAGINAL DELIVERY     NSVD x 2    Family History  Problem Relation Age of Onset  Cancer Mother 19       breast with a mastectomy that was hormone receptor  and rectal cancer at age 36 with a protectomy, ileostomy and colectomy, multiple polyps   Diabetes Mother    Hypertension Mother    Colon cancer Mother    Breast cancer Mother 26   Osteoporosis Mother    Cancer Father        pancreatic   Colon polyps Father    Alcohol abuse Father    Heart disease Father    Early death Sister 18       suicide   Thyroid  disease Neg Hx     Social History:  reports that she quit  smoking about 41 years ago. Her smoking use included cigarettes. She started smoking about 51 years ago. She has a 10 pack-year smoking history. She has been exposed to tobacco smoke. She has never used smokeless tobacco. She reports current alcohol use. She reports that she does not use drugs.  Allergies:  Allergies  Allergen Reactions   Amlodipine  Other (See Comments) and Swelling    edema   Azithromycin Rash   Hydrochlorothiazide Other (See Comments)    H/o sulfa allergy   Penicillins Other (See Comments)    Tongue swells    Pravachol  [Pravastatin ] Diarrhea and Nausea And Vomiting    Tongue swelling   Sulfa Antibiotics Other (See Comments)    Tongue swells   Ace Inhibitors Other (See Comments)    Would avoid.  She has h/o lip swelling with other meds.    Albuterol      Pulse elevation.   Angiotensin Receptor Blockers Other (See Comments)    Would avoid.  She has h/o lip swelling with other meds.    Clonidine  Derivatives Other (See Comments)    bradycardia   Diphenhydramine Hcl     Benadryl cream causes local skin irritation.  Would avoid med.  She can tolerate zyrtec.    Fentanyl  Other (See Comments)    She felt diffusely awful while on med   Hydrochlorothiazide-Triamterene     Possible hives and GI upset   Hydrocodone     intolerant   Metformin And Related     rash   Metoprolol      Bradycardia.   Nitrofurantoin Other (See Comments)    rash   Oxycodone Other (See Comments)    intolerant   Tetanus Toxoid-Containing Vaccines     Local swelling, can consider split dosing 2 weeks apart   Cefuroxime Axetil Rash    Rash   Levofloxacin In D5w Itching    Severe itching and flushing.   Tetanus Toxoid, Adsorbed Rash    Medications reviewed.    ROS Full ROS performed and is otherwise negative other than what is stated in HPI   BP (!) 140/80   Pulse 82   Ht 5' 5 (1.651 m)   Wt 204 lb (92.5 kg)   LMP  (LMP Unknown)   SpO2 98%   BMI 33.95 kg/m   Physical  Exam Vitals and nursing note reviewed. Exam conducted with a chaperone present.  Constitutional:      Appearance: Normal appearance. She is not ill-appearing.  Neck:     Vascular: No carotid bruit.  Cardiovascular:     Rate and Rhythm: Normal rate and regular rhythm.     Heart sounds: No murmur heard. Pulmonary:     Effort: Pulmonary effort is normal.     Breath sounds: Normal breath sounds. No stridor.  Comments:   Breast exam: There is a pea-sized area of induration palpable in the left upper outer quadrant at the 4 o'clock position.  No palpable bilateral axillary adenopathy.  No palpable masses in the right breast. She does have a 5 mm skin tag 5 mm below left mammary fold Abdominal:     General: Abdomen is flat. There is no distension.     Palpations: Abdomen is soft. There is no mass.     Tenderness: There is no abdominal tenderness. There is no guarding or rebound.     Hernia: No hernia is present.  Musculoskeletal:        General: No swelling or tenderness. Normal range of motion.     Cervical back: Normal range of motion and neck supple. No rigidity or tenderness.  Lymphadenopathy:     Cervical: No cervical adenopathy.  Skin:    General: Skin is warm and dry.     Capillary Refill: Capillary refill takes less than 2 seconds.  Neurological:     General: No focal deficit present.     Mental Status: She is alert and oriented to person, place, and time.  Psychiatric:        Mood and Affect: Mood normal.        Behavior: Behavior normal.        Thought Content: Thought content normal.        Judgment: Judgment normal.     Assessment/Plan: 69 year old female with recently diagnosed invasive breast carcinoma on the left side. ER and PR +   Seems to be early-stage.   Discussed with the patient in detail about her disease process.  We talked about the roles for chemotherapy, hormonal therapy, surgical therapy and radiation therapy.  We specifically touch base about the  multidisciplinary approach to breast cancer.  I do think that  upfront surgery will be indicated.  She interested in breast preserving surgery.  We initially talked about potential from lumpectomy plus sentinel lymph node biopsy.  The risks, the benefits and the possible complication.  We talked about the R, B and possible complications of surgery including but not limited to bleeding, infection, lymphedema, breast deformity, margin positivity and the potential need for additional surgery. They understand and are in agreement She also wishes to have excised a benign skin lesion on her left chest wall I personally spent a total of 40 minutes in the care of the patient today including performing a medically appropriate exam/evaluation, counseling and educating, placing orders, referring and communicating with other health care professionals, documenting clinical information in the EHR, independently interpreting and reviewing images studies and coordinating care.   Laneta Luna, MD [facility] General Surgeon

## 2023-11-08 NOTE — Patient Instructions (Addendum)
 We have spoken today about removing a lump in your breast. This will be done by Dr. Jordis at [facility].  You will most likely be able to leave the hospital several hours after your surgery.  Plan to tentatively be off work for 1-2 weeks following the surgery and may return with approximately 2 more weeks of a lifting restriction, no greater than 15 lbs.  If you have FMLA or Disability paperwork that needs to be filled out, please have your company fax your paperwork to [phone] or you may drop this by either office. This paperwork will be filled out within 3 days after your surgery has been completed.  What is radio frequency localization of the breast?(RFID) RFID/Scout tag localization uses radiofrequency technology to accurately pinpoint the tumor. Seeing exactly where the tumor is before surgery helps surgeons more effectively remove the entire tumor and spare surrounding healthy breast tissue.     Lumpectomy A lumpectomy is a form of breast conserving or breast preservation surgery. It may also be referred to as a partial mastectomy. During a lumpectomy, the portion of the breast that contains the cancerous tumor or breast mass (the lump) is removed. Some normal tissue around the lump may also be removed to make sure all of the tumor has been removed.  LET [facility] CARE PROVIDER KNOW ABOUT: Any allergies you have. All medicines you are taking, including vitamins, herbs, eye drops, creams, and over-the-counter medicines. Previous problems you or members of your family have had with the use of anesthetics. Any blood disorders you have. Previous surgeries you have had. Medical conditions you have. RISKS AND COMPLICATIONS Generally, this is a safe procedure. However, problems can occur and include: Bleeding. Infection. Pain. Temporary swelling. Change in the shape of the breast, particularly if a large portion is removed. BEFORE THE PROCEDURE Ask your health care provider about  changing or stopping your regular medicines. This is especially important if you are taking diabetes medicines or blood thinners. Do not eat or drink anything after midnight on the night before the procedure or as directed by your health care provider. Ask your health care provider if you can take a sip of water  with any approved medicines. On the day of surgery, your health care provider will use a mammogram or ultrasound to locate and mark the tumor in your breast. These markings on your breast will show where the cut (incision) will be made.   PROCEDURE  An IV tube will be put into one of your veins. You may be given medicine to help you relax before the surgery (sedative). You will be given one of the following: A medicine that numbs the area (local anesthetic). A medicine that makes you fall asleep (general anesthetic). Your health care provider will use a kind of electric scalpel that uses heat to minimize bleeding (electrocautery knife). A curved incision (like a smile or frown) that follows the natural curve of your breast is made, to allow for minimal scarring and better healing. The tumor will be removed with some of the surrounding tissue. This will be sent to the lab for analysis. Your health care provider may also remove your lymph nodes at this time if needed. Sometimes, but not always, a rubber tube called a drain will be surgically inserted into your breast area or armpit to collect excess fluid that may accumulate in the space where the tumor was. This drain is connected to a plastic bulb on the outside of your body. This drain  creates suction to help remove the fluid. The incisions will be closed with stitches (sutures). A bandage may be placed over the incisions. AFTER THE PROCEDURE You will be taken to the recovery area. You will be given medicine for pain. A small rubber drain may be placed in the breast for 2-3 days to prevent a collection of blood (hematoma) from developing  in the breast. You will be given instructions on caring for the drain before you go home. A pressure bandage (dressing) will be applied for 1-2 days to prevent bleeding. Ask your health care provider how to care for your bandage at home.   This information is not intended to replace advice given to you by your health care provider. Make sure you discuss any questions you have with your health care provider.   Document Released: 03/16/2006 Document Revised: 02/23/2014 Document Reviewed: 07/08/2012 Elsevier Interactive Patient Education [employer]

## 2023-11-09 ENCOUNTER — Ambulatory Visit
Admission: RE | Admit: 2023-11-09 | Discharge: 2023-11-09 | Disposition: A | Discharge status: Home / Self Care | Payer: Medicare (Managed Care) | Source: Ambulatory Visit | Attending: Surgery | Admitting: Surgery

## 2023-11-09 ENCOUNTER — Encounter: Payer: Self-pay | Admitting: Urgent Care

## 2023-11-09 ENCOUNTER — Other Ambulatory Visit: Payer: Self-pay

## 2023-11-09 ENCOUNTER — Encounter
Admission: RE | Admit: 2023-11-09 | Discharge: 2023-11-09 | Disposition: A | Discharge status: Home / Self Care | Payer: Medicare (Managed Care) | Source: Ambulatory Visit | Attending: Surgery | Admitting: Surgery

## 2023-11-09 ENCOUNTER — Inpatient Hospital Stay
Admission: RE | Admit: 2023-11-09 | Discharge: 2023-11-09 | Disposition: A | Discharge status: Home / Self Care | Payer: Medicare (Managed Care) | Source: Ambulatory Visit | Attending: Surgery

## 2023-11-09 DIAGNOSIS — E119 Type 2 diabetes mellitus without complications: Secondary | ICD-10-CM | POA: Insufficient documentation

## 2023-11-09 DIAGNOSIS — C50412 Malignant neoplasm of upper-outer quadrant of left female breast: Secondary | ICD-10-CM | POA: Diagnosis not present

## 2023-11-09 DIAGNOSIS — R928 Other abnormal and inconclusive findings on diagnostic imaging of breast: Secondary | ICD-10-CM

## 2023-11-09 DIAGNOSIS — I10 Essential (primary) hypertension: Secondary | ICD-10-CM | POA: Insufficient documentation

## 2023-11-09 DIAGNOSIS — C50912 Malignant neoplasm of unspecified site of left female breast: Secondary | ICD-10-CM | POA: Insufficient documentation

## 2023-11-09 DIAGNOSIS — Z01810 Encounter for preprocedural cardiovascular examination: Secondary | ICD-10-CM | POA: Diagnosis not present

## 2023-11-09 DIAGNOSIS — Z01812 Encounter for preprocedural laboratory examination: Secondary | ICD-10-CM

## 2023-11-09 HISTORY — PX: BREAST BIOPSY: SHX20

## 2023-11-09 HISTORY — DX: Malignant neoplasm of upper-outer quadrant of left female breast: C50.412

## 2023-11-09 LAB — BASIC METABOLIC PANEL WITH GFR
Anion gap: 9 (ref 5–15)
BUN: 29 mg/dL — ABNORMAL HIGH (ref 8–23)
CO2: 23 mmol/L (ref 22–32)
Calcium: 10.1 mg/dL (ref 8.9–10.3)
Chloride: 103 mmol/L (ref 98–111)
Creatinine, Ser: 1.63 mg/dL — ABNORMAL HIGH (ref 0.44–1.00)
GFR, Estimated: 34 mL/min — ABNORMAL LOW (ref >60)
Glucose, Bld: 144 mg/dL — ABNORMAL HIGH (ref 70–99)
Potassium: 3.9 mmol/L (ref 3.5–5.1)
Sodium: 135 mmol/L (ref 135–145)

## 2023-11-09 LAB — CBC
HCT: 50.4 % — ABNORMAL HIGH (ref 36.0–46.0)
Hemoglobin: 16.8 g/dL — ABNORMAL HIGH (ref 12.0–15.0)
MCH: 32.7 pg (ref 26.0–34.0)
MCHC: 33.3 g/dL (ref 30.0–36.0)
MCV: 98.1 fL (ref 80.0–100.0)
Platelets: 231 K/uL (ref 150–400)
RBC: 5.14 MIL/uL — ABNORMAL HIGH (ref 3.87–5.11)
RDW: 12.2 % (ref 11.5–15.5)
WBC: 10.1 K/uL (ref 4.0–10.5)
nRBC: 0 % (ref 0.0–0.2)

## 2023-11-09 MED ORDER — LIDOCAINE 1 % OPTIME INJ - NO CHARGE
5.0000 mL | Freq: Once | INTRAMUSCULAR | Status: AC
  Administered 2023-11-09: 5 mL
  Filled 2023-11-09: qty 6

## 2023-11-09 NOTE — Patient Instructions (Addendum)
 Your procedure is scheduled on:11/11/2023 Thursday Report to the Registration Desk on the 1st floor of the [facility]. To find out your arrival time, please call [phone] between 1PM - 3PM on: 11/10/2023 or follow  instructions given , because you have to go to other department before surgery. If your arrival time is 6:00 am, do not arrive before that time as the [facility] entrance doors do not open until 6:00 am.  REMEMBER: Instructions that are not followed completely may result in serious medical risk, up to and including death; or upon the discretion of your surgeon and anesthesiologist your surgery may need to be rescheduled.  Do not eat food after midnight the night before surgery.  No gum chewing or hard candies.  You may however, drink CLEAR liquids up to 2 hours before you are scheduled to arrive for your surgery. Do not drink anything within 2 hours of your scheduled arrival time. Remember the cut off time!!!!!  Clear liquids include: - water   _black coffee or tea (Do NOT add milk or creamers to the coffee or tea) Do NOT drink anything that is not on this list.   One week prior to surgery: Stop Anti-inflammatories (NSAIDS) such as Advil , Aleve, Ibuprofen , Motrin , Naproxen, Naprosyn and Aspirin based products such as Excedrin, Goody's Powder, BC Powder. Stop ANY OVER THE COUNTER supplements until after surgery.  You may however, continue to take Tylenol  if needed for pain up until the day of surgery.   Hold Jardiance  3 days before surgery as this cause delay in gastric emptying.   Continue taking all of your other prescription medications up until the day of surgery.   Do not take spironolactone  on day of surgery.   No Alcohol for 24 hours before or after surgery.  No Smoking including e-cigarettes for 24 hours before surgery.  No chewable tobacco products for at least 6 hours before surgery.  No nicotine patches on the day of surgery.  Do not use any  recreational drugs for at least a week (preferably 2 weeks) before your surgery.  Please be advised that the combination of cocaine and anesthesia may have negative outcomes, up to and including death. If you test positive for cocaine, your surgery will be cancelled.  On the morning of surgery brush your teeth with toothpaste and water , you may rinse your mouth with mouthwash if you wish. Do not swallow any toothpaste or mouthwash.  Use CHG Soap or wipes as directed on instruction sheet.-provided for you   Do not wear jewelry, make-up, hairpins, clips or nail polish.  For welded (permanent) jewelry: bracelets, anklets, waist bands, etc.  Please have this removed prior to surgery.  If it is not removed, there is a chance that hospital personnel will need to cut it off on the day of surgery.  Do not wear lotions, powders, or perfumes.   Do not shave body hair from the neck down 48 hours before surgery.  Contact lenses, hearing aids and dentures may not be worn into surgery.  Do not bring valuables to the hospital. [facility] is not responsible for any missing/lost belongings or valuables.   Notify your doctor if there is any change in your medical condition (cold, fever, infection).  Wear comfortable clothing (specific to your surgery type) to the hospital.  After surgery, you can help prevent lung complications by doing breathing exercises.  Take deep breaths and cough every 1-2 hours. Your doctor may order a device called an Facilities manager  to help you take deep breaths.  If you are being admitted to the hospital overnight, leave your suitcase in the car. After surgery it may be brought to your room.   If you are being discharged the day of surgery, you will not be allowed to drive home. You will need a responsible individual to drive you home and stay with you for 24 hours after surgery.    Please call the Pre-admissions Testing Dept. at [phone] if you have any  questions about these instructions.  Surgery Visitation Policy:  Patients having surgery or a procedure may have two visitors.  Children under the age of 66 must have an adult with them who is not the patient.   Merchandiser, retail to address health-related social needs:  [email or web address]                                                                                                             Preparing for Surgery with CHLORHEXIDINE  GLUCONATE (CHG) Soap  Chlorhexidine  Gluconate (CHG) Soap  o An antiseptic cleaner that kills germs and bonds with the skin to continue killing germs even after washing  o Used for showering the night before surgery and morning of surgery  Before surgery, you can play an important role by reducing the number of germs on your skin.  CHG (Chlorhexidine  gluconate) soap is an antiseptic cleanser which kills germs and bonds with the skin to continue killing germs even after washing.  Please do not use if you have an allergy to CHG or antibacterial soaps. If your skin becomes reddened/irritated stop using the CHG.  1. Shower the NIGHT BEFORE SURGERY and the MORNING OF SURGERY with CHG soap.  2. If you choose to wash your hair, wash your hair first as usual with your normal shampoo.  3. After shampooing, rinse your hair and body thoroughly to remove the shampoo.  4. Use CHG as you would any other liquid soap. You can apply CHG directly to the skin and wash gently with a scrungie or a clean washcloth.  5. Apply the CHG soap to your body only from the neck down. Do not use on open wounds or open sores. Avoid contact with your eyes, ears, mouth, and genitals (private parts). Wash face and genitals (private parts) with your normal soap.  6. Wash thoroughly, paying special attention to the area where your surgery will be performed.  7. Thoroughly rinse your body with warm water .  8. Do not shower/wash with your normal soap after using  and rinsing off the CHG soap.  9. Pat yourself dry with a clean towel.  10. Wear clean pajamas to bed the night before surgery.  12. Place clean sheets on your bed the night of your first shower and do not sleep with pets.  13. Shower again with the CHG soap on the day of surgery prior to arriving at the hospital.  14. Do not apply any deodorants/lotions/powders.  15. Please wear clean clothes to the hospital.

## 2023-11-10 ENCOUNTER — Encounter: Payer: Self-pay | Admitting: Urgent Care

## 2023-11-11 ENCOUNTER — Other Ambulatory Visit: Payer: Self-pay

## 2023-11-11 ENCOUNTER — Ambulatory Visit: Payer: Medicare (Managed Care)

## 2023-11-11 ENCOUNTER — Ambulatory Visit
Admission: RE | Admit: 2023-11-11 | Discharge: 2023-11-11 | Disposition: A | Discharge status: Home / Self Care | Payer: Medicare (Managed Care) | Source: Ambulatory Visit | Attending: Surgery | Admitting: Surgery

## 2023-11-11 ENCOUNTER — Ambulatory Visit
Admission: RE | Admit: 2023-11-11 | Discharge: 2023-11-11 | Disposition: A | Discharge status: Home / Self Care | Payer: Medicare (Managed Care) | Attending: Surgery | Admitting: Surgery

## 2023-11-11 ENCOUNTER — Encounter
Admission: RE | Admit: 2023-11-11 | Discharge: 2023-11-11 | Disposition: A | Discharge status: Home / Self Care | Payer: Medicare (Managed Care) | Source: Ambulatory Visit | Attending: Surgery | Admitting: Surgery

## 2023-11-11 ENCOUNTER — Encounter: Payer: Self-pay | Admitting: Surgery

## 2023-11-11 ENCOUNTER — Encounter
Admission: RE | Disposition: A | Discharge status: Home / Self Care | Payer: Self-pay | Source: Home / Self Care | Attending: Surgery

## 2023-11-11 DIAGNOSIS — Z1721 Progesterone receptor positive status: Secondary | ICD-10-CM | POA: Diagnosis not present

## 2023-11-11 DIAGNOSIS — I2510 Atherosclerotic heart disease of native coronary artery without angina pectoris: Secondary | ICD-10-CM | POA: Insufficient documentation

## 2023-11-11 DIAGNOSIS — C773 Secondary and unspecified malignant neoplasm of axilla and upper limb lymph nodes: Secondary | ICD-10-CM | POA: Diagnosis not present

## 2023-11-11 DIAGNOSIS — K219 Gastro-esophageal reflux disease without esophagitis: Secondary | ICD-10-CM | POA: Diagnosis not present

## 2023-11-11 DIAGNOSIS — C50912 Malignant neoplasm of unspecified site of left female breast: Secondary | ICD-10-CM

## 2023-11-11 DIAGNOSIS — C50412 Malignant neoplasm of upper-outer quadrant of left female breast: Secondary | ICD-10-CM | POA: Insufficient documentation

## 2023-11-11 DIAGNOSIS — N6082 Other benign mammary dysplasias of left breast: Secondary | ICD-10-CM | POA: Insufficient documentation

## 2023-11-11 DIAGNOSIS — R928 Other abnormal and inconclusive findings on diagnostic imaging of breast: Secondary | ICD-10-CM

## 2023-11-11 DIAGNOSIS — Z170 Estrogen receptor positive status [ER+]: Secondary | ICD-10-CM | POA: Insufficient documentation

## 2023-11-11 DIAGNOSIS — E119 Type 2 diabetes mellitus without complications: Secondary | ICD-10-CM | POA: Insufficient documentation

## 2023-11-11 DIAGNOSIS — Z01812 Encounter for preprocedural laboratory examination: Secondary | ICD-10-CM

## 2023-11-11 DIAGNOSIS — Z87891 Personal history of nicotine dependence: Secondary | ICD-10-CM | POA: Diagnosis not present

## 2023-11-11 DIAGNOSIS — Z803 Family history of malignant neoplasm of breast: Secondary | ICD-10-CM | POA: Diagnosis not present

## 2023-11-11 DIAGNOSIS — I10 Essential (primary) hypertension: Secondary | ICD-10-CM | POA: Diagnosis not present

## 2023-11-11 DIAGNOSIS — E785 Hyperlipidemia, unspecified: Secondary | ICD-10-CM | POA: Diagnosis not present

## 2023-11-11 HISTORY — PX: BREAST LUMPECTOMY WITH RADIO FREQUENCY LOCALIZER: SHX6897

## 2023-11-11 HISTORY — PX: AXILLARY SENTINEL NODE BIOPSY: SHX5738

## 2023-11-11 LAB — GLUCOSE, CAPILLARY
Glucose-Capillary: 146 mg/dL — ABNORMAL HIGH (ref 70–99)
Glucose-Capillary: 157 mg/dL — ABNORMAL HIGH (ref 70–99)

## 2023-11-11 SURGERY — BREAST LUMPECTOMY WITH RADIO FREQUENCY LOCALIZER
Anesthesia: General | Site: Breast | Laterality: Left

## 2023-11-11 MED ORDER — METHYLENE BLUE 20 MG/2ML IV SOSY
PREFILLED_SYRINGE | INTRAVENOUS | Status: AC
Start: 2023-11-11 — End: 2023-11-11
  Filled 2023-11-11: qty 2

## 2023-11-11 MED ORDER — PROPOFOL 1000 MG/100ML IV EMUL
INTRAVENOUS | Status: AC
  Filled 2023-11-11: qty 100

## 2023-11-11 MED ORDER — ISOSULFAN BLUE 1 % ~~LOC~~ SOLN
SUBCUTANEOUS | Status: AC
  Filled 2023-11-11: qty 5

## 2023-11-11 MED ORDER — BUPIVACAINE-EPINEPHRINE (PF) 0.25% -1:200000 IJ SOLN
INTRAMUSCULAR | Status: AC
Start: 2023-11-11 — End: 2023-11-11
  Filled 2023-11-11: qty 30

## 2023-11-11 MED ORDER — CHLORHEXIDINE GLUCONATE 0.12 % MT SOLN
15.0000 mL | Freq: Once | OROMUCOSAL | Status: AC
  Administered 2023-11-11: 15 mL via OROMUCOSAL

## 2023-11-11 MED ORDER — GABAPENTIN 300 MG PO CAPS
300.0000 mg | ORAL_CAPSULE | ORAL | Status: AC
  Administered 2023-11-11: 300 mg via ORAL

## 2023-11-11 MED ORDER — MIDAZOLAM HCL 2 MG/2ML IJ SOLN
INTRAMUSCULAR | Status: AC
  Filled 2023-11-11: qty 2

## 2023-11-11 MED ORDER — LIDOCAINE HCL (PF) 2 % IJ SOLN
INTRAMUSCULAR | Status: AC
Start: 2023-11-11 — End: 2023-11-11
  Filled 2023-11-11: qty 5

## 2023-11-11 MED ORDER — CHLORHEXIDINE GLUCONATE CLOTH 2 % EX PADS: 6.0000 | MEDICATED_PAD | Freq: Once | CUTANEOUS | Status: DC

## 2023-11-11 MED ORDER — SUCCINYLCHOLINE CHLORIDE 200 MG/10ML IV SOSY
PREFILLED_SYRINGE | INTRAVENOUS | Status: DC | PRN
  Administered 2023-11-11: 100 mg via INTRAVENOUS

## 2023-11-11 MED ORDER — LIDOCAINE HCL (CARDIAC) PF 100 MG/5ML IV SOSY
PREFILLED_SYRINGE | INTRAVENOUS | Status: DC | PRN
  Administered 2023-11-11: 60 mg via INTRAVENOUS
  Administered 2023-11-11: 40 mg via INTRAVENOUS

## 2023-11-11 MED ORDER — CELECOXIB 200 MG PO CAPS: 200.0000 mg | ORAL_CAPSULE | ORAL | Status: DC

## 2023-11-11 MED ORDER — PROPOFOL 10 MG/ML IV BOLUS
INTRAVENOUS | Status: AC
Start: 2023-11-11 — End: 2023-11-11
  Filled 2023-11-11: qty 40

## 2023-11-11 MED ORDER — HYDROMORPHONE HCL 1 MG/ML IJ SOLN
INTRAMUSCULAR | Status: DC | PRN
  Administered 2023-11-11: .25 mg via INTRAVENOUS

## 2023-11-11 MED ORDER — FENTANYL CITRATE (PF) 100 MCG/2ML IJ SOLN
INTRAMUSCULAR | Status: AC
  Filled 2023-11-11: qty 2

## 2023-11-11 MED ORDER — DROPERIDOL 2.5 MG/ML IJ SOLN
INTRAMUSCULAR | Status: AC
  Filled 2023-11-11: qty 2

## 2023-11-11 MED ORDER — ACETAMINOPHEN 500 MG PO TABS
1000.0000 mg | ORAL_TABLET | ORAL | Status: AC
  Administered 2023-11-11: 1000 mg via ORAL

## 2023-11-11 MED ORDER — SODIUM CHLORIDE 0.9 % IV SOLN: INTRAVENOUS | Status: DC

## 2023-11-11 MED ORDER — HYDROMORPHONE HCL 1 MG/ML IJ SOLN
INTRAMUSCULAR | Status: AC
  Filled 2023-11-11: qty 1

## 2023-11-11 MED ORDER — DROPERIDOL 2.5 MG/ML IJ SOLN
0.6250 mg | Freq: Once | INTRAMUSCULAR | Status: AC | PRN
  Administered 2023-11-11: 0.625 mg via INTRAVENOUS

## 2023-11-11 MED ORDER — TRAMADOL HCL 50 MG PO TABS: 50.0000 mg | ORAL_TABLET | Freq: Four times a day (QID) | ORAL | 0 refills | Status: DC | PRN

## 2023-11-11 MED ORDER — GABAPENTIN 300 MG PO CAPS
ORAL_CAPSULE | ORAL | Status: AC
Start: 2023-11-11 — End: 2023-11-11
  Filled 2023-11-11: qty 1

## 2023-11-11 MED ORDER — ACETAMINOPHEN 10 MG/ML IV SOLN: 1000.0000 mg | Freq: Once | INTRAVENOUS | Status: DC | PRN

## 2023-11-11 MED ORDER — METHYLENE BLUE (ANTIDOTE) 1 % IV SOLN
INTRAVENOUS | Status: AC
Start: 2023-11-11 — End: 2023-11-11
  Filled 2023-11-11: qty 10

## 2023-11-11 MED ORDER — ISOSULFAN BLUE 1 % ~~LOC~~ SOLN
SUBCUTANEOUS | Status: DC | PRN
  Administered 2023-11-11: 50 mg via SUBCUTANEOUS

## 2023-11-11 MED ORDER — CELECOXIB 200 MG PO CAPS
ORAL_CAPSULE | ORAL | Status: AC
Start: 2023-11-11 — End: 2023-11-11
  Filled 2023-11-11: qty 1

## 2023-11-11 MED ORDER — CLINDAMYCIN PHOSPHATE 900 MG/50ML IV SOLN
900.0000 mg | INTRAVENOUS | Status: AC
  Administered 2023-11-11: 900 mg via INTRAVENOUS

## 2023-11-11 MED ORDER — CLINDAMYCIN PHOSPHATE 900 MG/50ML IV SOLN
INTRAVENOUS | Status: AC
  Filled 2023-11-11: qty 50

## 2023-11-11 MED ORDER — STERILE WATER FOR IRRIGATION IR SOLN
Status: DC | PRN
  Administered 2023-11-11: 500 mL

## 2023-11-11 MED ORDER — SUCCINYLCHOLINE CHLORIDE 200 MG/10ML IV SOSY
PREFILLED_SYRINGE | INTRAVENOUS | Status: AC
  Filled 2023-11-11: qty 10

## 2023-11-11 MED ORDER — DEXAMETHASONE SODIUM PHOSPHATE 10 MG/ML IJ SOLN
INTRAMUSCULAR | Status: DC | PRN
  Administered 2023-11-11: 10 mg via INTRAVENOUS

## 2023-11-11 MED ORDER — ORAL CARE MOUTH RINSE: 15.0000 mL | Freq: Once | OROMUCOSAL | Status: AC

## 2023-11-11 MED ORDER — TECHNETIUM TC 99M TILMANOCEPT KIT
1.0300 | PACK | Freq: Once | INTRAVENOUS | Status: AC | PRN
Start: 2023-11-11 — End: 2023-11-11
  Administered 2023-11-11: 1.03 via INTRADERMAL

## 2023-11-11 MED ORDER — ACETAMINOPHEN 500 MG PO TABS
ORAL_TABLET | ORAL | Status: AC
Start: 2023-11-11 — End: 2023-11-11
  Filled 2023-11-11: qty 2

## 2023-11-11 MED ORDER — PROPOFOL 10 MG/ML IV BOLUS
INTRAVENOUS | Status: DC | PRN
  Administered 2023-11-11: 50 mg via INTRAVENOUS
  Administered 2023-11-11: 200 mg via INTRAVENOUS

## 2023-11-11 MED ORDER — PROPOFOL 500 MG/50ML IV EMUL
INTRAVENOUS | Status: DC | PRN
  Administered 2023-11-11: 150 ug/kg/min via INTRAVENOUS

## 2023-11-11 MED ORDER — DEXAMETHASONE SODIUM PHOSPHATE 10 MG/ML IJ SOLN
INTRAMUSCULAR | Status: AC
Start: 2023-11-11 — End: 2023-11-11
  Filled 2023-11-11: qty 1

## 2023-11-11 MED ORDER — ONDANSETRON HCL 4 MG/2ML IJ SOLN
INTRAMUSCULAR | Status: DC | PRN
  Administered 2023-11-11: 4 mg via INTRAVENOUS

## 2023-11-11 MED ORDER — SODIUM CHLORIDE (PF) 0.9 % IJ SOLN
INTRAMUSCULAR | Status: AC
  Filled 2023-11-11: qty 10

## 2023-11-11 MED ORDER — TRAMADOL HCL 50 MG PO TABS: 25.0000 mg | ORAL_TABLET | Freq: Once | ORAL | Status: DC | PRN

## 2023-11-11 MED ORDER — CHLORHEXIDINE GLUCONATE 0.12 % MT SOLN
OROMUCOSAL | Status: AC
  Filled 2023-11-11: qty 15

## 2023-11-11 SURGICAL SUPPLY — 28 items
APPLICATOR CHLORAPREP 10 TEAL (MISCELLANEOUS) ×2 IMPLANT
CLIP APPLIE 9.375 SM OPEN (CLIP) IMPLANT
COVER PROBE GAMMA FINDER SLV (MISCELLANEOUS) ×2 IMPLANT
DERMABOND ADVANCED .7 DNX12 (GAUZE/BANDAGES/DRESSINGS) ×2 IMPLANT
DEVICE DUBIN SPECIMEN MAMMOGRA (MISCELLANEOUS) ×2 IMPLANT
DRAPE CHEST BREAST 77X106 FENE (MISCELLANEOUS) ×2 IMPLANT
ELECT CAUTERY BLADE 6.4 (BLADE) ×2 IMPLANT
ELECTRODE REM PT RTRN 9FT ADLT (ELECTROSURGICAL) ×2 IMPLANT
GLOVE BIO SURGEON STRL SZ7 (GLOVE) ×2 IMPLANT
GOWN STRL REUS W/ TWL LRG LVL3 (GOWN DISPOSABLE) ×4 IMPLANT
KIT MARKER MARGIN INK (KITS) IMPLANT
KIT TURNOVER KIT A (KITS) ×2 IMPLANT
MANIFOLD NEPTUNE II (INSTRUMENTS) ×2 IMPLANT
MARKER MARGIN CORRECT CLIP (MARKER) IMPLANT
NDL HYPO 22X1.5 SAFETY MO (MISCELLANEOUS) ×2 IMPLANT
NDL SAFETY ECLIPSE 18X1.5 (NEEDLE) IMPLANT
NEEDLE HYPO 22X1.5 SAFETY MO (MISCELLANEOUS) ×2 IMPLANT
PACK BASIN MINOR ARMC (MISCELLANEOUS) ×2 IMPLANT
SHEATH BREAST BIOPSY SKIN MKR (SHEATH) ×2 IMPLANT
SPONGE T-LAP 18X18 ~~LOC~~+RFID (SPONGE) ×2 IMPLANT
SUT SILK 2 0 SH (SUTURE) IMPLANT
SUT VIC AB 2-0 SH 27XBRD (SUTURE) ×4 IMPLANT
SUT VIC AB 3-0 SH 27X BRD (SUTURE) ×4 IMPLANT
SUTURE MNCRL 4-0 27XMF (SUTURE) ×4 IMPLANT
SYR 10ML LL (SYRINGE) IMPLANT
TRAP FLUID SMOKE EVACUATOR (MISCELLANEOUS) ×2 IMPLANT
TRAP NEPTUNE SPECIMEN COLLECT (MISCELLANEOUS) ×2 IMPLANT
WATER STERILE IRR 500ML POUR (IV SOLUTION) ×2 IMPLANT

## 2023-11-11 NOTE — Op Note (Signed)
 Pre-operative Diagnosis:  Invasive left Breast Cancer   Post-operative Diagnosis: Same   Surgeon: Laneta Luna,  MD FACS  Anesthesia: GETA  Procedure: Left Partial mastectomy, Savi scout guided, sentinel node biopsy Complex closure with tissue rearrangements measuring 64cm2   Findings: Clip and lesion within xray specimen Three hot and blue nodes removed as SLNbx Gross margins free of disease per pathologist   Estimated Blood Loss: Minimal         Drains: None         Specimens: partial mastectomy with labels, Sentinel node        Complications: none                 Condition: Stable   Operation performed with curative intent:Yes   Tracer(s) used to identify sentinel nodes in the upfront surgery (non-neoadjuvant) setting (select all that apply):Dye and Radioactive Tracer   Tracer(s) used to identify sentinel nodes in the neoadjuvant setting (select all that apply):N/A   All nodes (colored or non-colored) present at the end of a dye-filled lymphatic channel were removed:Yes    All significantly radioactive nodes were removed:Yes   All palpable suspicious nodes were removed:Yes   Biopsy-proven positive nodes marked with clips prior to chemotherapy were identified and removed:N/A  Procedure Details  The patient was seen again in the Holding Room. The benefits, complications, treatment options, and expected outcomes were discussed with the patient. The risks of bleeding, infection, recurrence of symptoms, failure to resolve symptoms, hematoma, seroma, open wound, cosmetic deformity, and the need for further surgery were discussed.  The patient was taken to Operating Room, identified  and the procedure verified.  A Time Out was held and the above information confirmed.  Prior to the induction of general anesthesia, antibiotic prophylaxis was administered. VTE prophylaxis was in place. Appropriate anesthesia was then administered and tolerated well. I used the savi scout  probe to localize the area of maximal cadence and Marked the area. Isosulfan blue  dye was injected periareolar early under aseptic conditions.  The chest was prepped with Chloraprep and draped in the sterile fashion. The patient was positioned in the supine position.   I started on the left breast where an incision was made in a crescent moon shaped fashion after identifying again the point of maximal cadence. Circumferential flaps performed with cautery and breast tissue was elevated after taking a figure of eight stitch of the breast tissue. Partial mastectomy with adequate margins was performed using cautery.  The specimen was label, marked, we obtained faxitron images confirming appropriate excision, specimen was sent to permanent pathology . GIven the laxity of the breast and chest wall tissue and the fact that the lesion was lateral I was able to perform axillary node sampling via lumpectomy incision Using the hand-held probe an area of high counts was identified in the axilla,  and direction by the probe aided in dissection of a lymph node . I clipped the small lymphatic channels in the standard fashion. A total of 3 nodes that were both hot and blue were excised.   Tissue advancement flaps were performed to decrease the volume deficit in the area of the resection.  Please note that the total void area was 8 x 8 cm equals 64cm.  The chest wall flaps were created by incising the breast parenchyma from the pectoralis fascia in a circumferential method.  The breast parenchyma was then reapproximated in a deep to superficial fashion using interrupted 2-0 Vicryl sutures.  Please note that  I placed 2 deep layers of 2-0 Vicryl's. Once assuring that hemostasis was adequate and checked multiple times the wound was closed with 4-0 subcuticular Monocryl sutures. Dermabond was placed  Patient was taken to the recovery room in stable condition.   Laneta Luna , MD, FACS

## 2023-11-11 NOTE — Discharge Instructions (Signed)
 Lumpectomy, Care After The following information offers guidance on how to care for yourself after your procedure. Your health care provider may also give you more specific instructions. If you have problems or questions, contact your health care provider. What can I expect after the procedure? After the procedure, it is common to have: Some pain or redness at the incision site. Breast swelling. Breast tenderness. Stiffness in your arm or shoulder. A change in the shape and feel of your breast. Scar tissue that feels hard to the touch in the area where the lump was removed. Follow these instructions at home: Medicines Take over-the-counter and prescription medicines only as told by your health care provider. If you were prescribed an antibiotic, take it as told by your health care provider. Do not stop taking the antibiotic even if you start to feel better. Ask your health care provider if the medicine prescribed to you: Requires you to avoid driving or using machinery. Can cause constipation. You may need to take these actions to prevent or treat constipation: Drink enough fluid to keep your urine pale yellow. Take over-the-counter or prescription medicines. Eat foods that are high in fiber, such as beans, whole grains, and fresh fruits and vegetables. Limit foods that are high in fat and processed sugars, such as fried or sweet foods. Incision care     Follow instructions from your health care provider about how to take care of your incision. Make sure you: Wash your hands with soap and water for at least 20 seconds before and after you change your bandage (dressing). If soap and water are not available, use hand sanitizer. Change your dressing as told by your health care provider. Leave stitches (sutures), skin glue, or adhesive strips in place. These skin closures may need to stay in place for 2 weeks or longer. If adhesive strip edges start to loosen and curl up, you may trim the  loose edges. Do not remove adhesive strips completely unless your health care provider tells you to do that. Check your incision area every day for signs of infection. Check for: More redness, swelling, or pain. Fluid or blood. Warmth. Pus or a bad smell. Keep your dressing clean and dry. If you were sent home with a surgical drain in place, follow instructions from your health care provider about emptying it. Bathing Do not take baths, swim, or use a hot tub until your health care provider approves. Ask your health care provider if you may take showers. You may only be allowed to take sponge baths. Activity Rest as told by your health care provider. Do not sit for a long time without moving. Get up to take short walks every 1-2 hours. This will improve blood flow and breathing. Ask for help if you feel weak or unsteady. Be careful to avoid any activities that could cause an injury to your arm on the side of your surgery. Do not lift anything that is heavier than 10 lb (4.5 kg), or the limit that you are told, until your health care provider says that it is safe. Avoid lifting with the arm that is on the side of your surgery. Do not carry heavy objects on your shoulder on the side of your surgery. Do exercises to keep your shoulder and arm from getting stiff and swollen. Talk with your health care provider about which exercises are safe for you. Return to your normal activities as told by your health care provider. Ask your health care provider what activities  are safe for you. General instructions Wear a supportive bra as told by your health care provider. Raise (elevate) your arm above the level of your heart while you are sitting or lying down. Do not wear tight jewelry on your arm, wrist, or fingers on the side of your surgery. Wear compression stockings as told by your health care provider. These stockings help to prevent blood clots and reduce swelling in your legs. If you had any lymph  nodes removed during your procedure, be sure to tell all of your health care providers. It is important to share this information before you have certain procedures, such as blood tests or blood pressure measurements. Keep all follow-up visits. You may need to be screened for extra fluid around the lymph nodes and swelling in the breast and arm (lymphedema). Contact a health care provider if: You develop a rash. You have a fever. Your pain worsens or pain medicine is not working. You have swelling, weakness, or numbness in your arm that does not improve after a few weeks. You have new swelling in your breast. You have any of these signs of infection: More redness, swelling, or pain in your incision area. Fluid or blood coming from your incision. Warmth coming from the incision area. Pus or a bad smell coming from your incision. Get help right away if: You have very bad pain in your breast or arm. You have swelling in your legs or arms. You have redness, warmth, or pain in your leg or arm. You have chest pain. You have difficulty breathing. These symptoms may be an emergency. Get help right away. Call 911. Do not wait to see if the symptoms will go away. Do not drive yourself to the hospital. Summary After the procedure, it is common to have breast tenderness, swelling in your breast, and stiffness in your arm and shoulder. Follow instructions from your health care provider about how to take care of your incision. Do not lift anything that is heavier than 10 lb (4.5 kg), or the limit that you are told, until your health care provider says that it is safe. Avoid lifting with the arm that is on the side of your surgery. If you had any lymph nodes removed during your procedure, be sure to tell all of your health care providers. This information is not intended to replace advice given to you by your health care provider. Make sure you discuss any questions you have with your health care  provider. Document Revised: 04/13/2021 Document Reviewed: 04/13/2021 Elsevier Patient Education  2024 [employer].

## 2023-11-11 NOTE — Transfer of Care (Signed)
 Immediate Anesthesia Transfer of Care Note  Patient: Alexandria Fry  Procedure(s) Performed: BREAST LUMPECTOMY WITH RADIO FREQUENCY LOCALIZER (Left: Breast) BIOPSY, LYMPH NODE, SENTINEL, AXILLARY (Left)  Patient Location: PACU  Anesthesia Type:General  Level of Consciousness: awake and alert   Airway & Oxygen Therapy: Patient Spontanous Breathing and Patient connected to face mask oxygen  Post-op Assessment: Report given to RN and Post -op Vital signs reviewed and stable  Post vital signs: Reviewed and stable  Last Vitals:  Vitals Value Taken Time  BP 167/84 11/11/23 13:21  Temp    Pulse 80 11/11/23 13:24  Resp 22 11/11/23 13:24  SpO2 100 % 11/11/23 13:24  Vitals shown include unfiled device data.  Last Pain:  Vitals:   11/11/23 0839  TempSrc: Oral         Complications: No notable events documented.

## 2023-11-11 NOTE — Anesthesia Preprocedure Evaluation (Addendum)
 Anesthesia Evaluation  Patient identified by MRN, date of birth, ID band Patient awake    Reviewed: Allergy & Precautions, NPO status , Patient's Chart, lab work & pertinent test results  History of Anesthesia Complications (+) PONV, DIFFICULT AIRWAY and history of anesthetic complications (Patient has hx of difficult airway, multiple failed intubation attempts 2/2 limited neck mobility and anterior airway. Successful LMA placement, easy mask)  Airway Mallampati: IV  TM Distance: <3 FB Neck ROM: Limited    Dental  (+) Missing, Caps, Dental Advisory Given   Pulmonary former smoker   Pulmonary exam normal        Cardiovascular hypertension, + CAD  Normal cardiovascular exam  ECHO 3/23:  1. Left ventricular ejection fraction, by estimation, is 60 to 65%. The  left ventricle has normal function. The left ventricle has no regional  wall motion abnormalities. There is mild left ventricular hypertrophy.  Left ventricular diastolic parameters  are consistent with Grade I diastolic dysfunction (impaired relaxation).   2. Right ventricular systolic function is normal. The right ventricular  size is normal.   3. The mitral valve is normal in structure. No evidence of mitral valve  regurgitation.   4. The aortic valve is tricuspid. Aortic valve regurgitation is not  visualized.   5. The inferior vena cava is normal in size with greater than 50%  respiratory variability, suggesting right atrial pressure of 3 mmHg.     Neuro/Psych negative neurological ROS  negative psych ROS   GI/Hepatic ,GERD  ,,(+)     substance abuse  alcohol use  Endo/Other  diabetes, Type 2    Renal/GU Renal InsufficiencyRenal disease  negative genitourinary   Musculoskeletal   Abdominal  (+) + obese  Peds  Hematology negative hematology ROS (+)   Anesthesia Other Findings Patient reports she is unable to lie flat without difficulty breathing 2/2 a  large goiter. She denies OSA.  Past Medical History: No date: Allergic rhinitis No date: Diabetes mellitus without complication (HCC) No date: Difficult intubation No date: Fatty liver No date: GERD (gastroesophageal reflux disease) No date: Hypertension No date: Irregular menses     Comment:  with neg endometrial biopsy 2013 No date: Menorrhagia No date: Murmur, cardiac No date: Pneumonia     Comment:  2014 No date: PONV (postoperative nausea and vomiting)  Past Surgical History: No date: ABDOMINAL HYSTERECTOMY No date: CERVICAL CONE BIOPSY 01/18/2018: COLONOSCOPY WITH PROPOFOL ; N/A     Comment:  Procedure: COLONOSCOPY WITH PROPOFOL ;  Surgeon:               [name], MD;  Location: [facility];                Service: Endoscopy;  Laterality: N/A; 11/11/2015: CYSTOSCOPY     Comment:  Procedure: CYSTOSCOPY;  Surgeon: [name], MD;                Location: [facility];  Service: Gynecology;; 03/21/2015: DILATATION & CURETTAGE/HYSTEROSCOPY WITH MYOSURE; N/A     Comment:  Procedure: DILATATION & CURETTAGE/HYSTEROSCOPY,               POLYPECTOMY;  Surgeon: [name], MD;  Location:               [facility];  Service: Gynecology;  Laterality: N/A; 1981: DILATION AND CURETTAGE OF UTERUS     Comment:  [facility]  06/25/2009: DILATION AND CURETTAGE OF UTERUS     Comment:  normal (Dr. [name]) 11/11/2015:  LAPAROSCOPIC BILATERAL SALPINGECTOMY; Bilateral     Comment:  Procedure: LAPAROSCOPIC BILATERAL SALPINGECTOMY;                Surgeon: [name], MD;  Location: [facility];                Service: Gynecology;  Laterality: Bilateral; 11/11/2015: LAPAROSCOPIC SUPRACERVICAL HYSTERECTOMY     Comment:  Procedure: LAPAROSCOPIC SUPRACERVICAL HYSTERECTOMY               CONVERTED TO OPEN;  Surgeon: [name], MD;                Location: [facility];  Service: Gynecology;; 11/11/2015: LAPAROTOMY     Comment:  Procedure: LAPAROTOMY;  Surgeon: [name], MD;                 Location: [facility];  Service: Gynecology;; No date: VAGINAL DELIVERY     Comment:  NSVD x 2     Reproductive/Obstetrics negative OB ROS                              Anesthesia Physical Anesthesia Plan  ASA: 3  Anesthesia Plan: General   Post-op Pain Management: Tylenol  PO (pre-op)*   Induction: Intravenous  PONV Risk Score and Plan: 3 and Dexamethasone , Ondansetron  and Propofol  infusion  Airway Management Planned: Oral ETT  Additional Equipment:   Intra-op Plan:   Post-operative Plan: Extubation in OR  Informed Consent: I have reviewed the patients History and Physical, chart, labs and discussed the procedure including the risks, benefits and alternatives for the proposed anesthesia with the patient or authorized representative who has indicated his/her understanding and acceptance.     Dental Advisory Given  Plan Discussed with: Anesthesiologist, CRNA and Surgeon  Anesthesia Plan Comments: (Previous intubation with a glidscope was difficult. Pt here without stridor, speech normal, mouth opens to 3 fingerwidth with effort from patient. TM distance short. With the tools of glidscope and fiberoptic scope available, intubation will be performed. )         Anesthesia Quick Evaluation

## 2023-11-11 NOTE — Anesthesia Procedure Notes (Signed)
 Procedure Name: Intubation Date/Time: 11/11/2023 11:05 AM  Performed by: Trudy Rankin LABOR, CRNAPre-anesthesia Checklist: Patient identified, Patient being monitored, Timeout performed, Emergency Drugs available and Suction available Patient Re-evaluated:Patient Re-evaluated prior to induction Oxygen Delivery Method: Circle System Utilized Preoxygenation: Pre-oxygenation with 100% oxygen Induction Type: IV induction Ventilation: Two handed mask ventilation required, Oral airway inserted - appropriate to patient size and Mask ventilation with difficulty Laryngoscope Size: Mac, 3 and McGrath (x-blade 3) Grade View: Grade III Tube type: Oral Tube size: 7.0 mm Number of attempts: 1 Airway Equipment and Method: Rigid stylet Placement Confirmation: ETT inserted through vocal cords under direct vision, positive ETCO2 and breath sounds checked- equal and bilateral Secured at: 21 cm Tube secured with: Tape Dental Injury: Teeth and Oropharynx as per pre-operative assessment  Difficulty Due To: Difficulty was anticipated, Difficult Airway- due to anterior larynx, Difficult Airway- due to limited oral opening, Difficult Airway- due to large tongue and Difficult Airway- due to reduced neck mobility Future Recommendations: Recommend- induction with short-acting agent, and alternative techniques readily available Comments: Difficult mask ventilation requiring 2 providers and OPA. Difficult visualization with McGrath blade. Very anterior Airway. Limited oral opening. Short TM distance and small mandible. Successful intubation with poor view on 1st attempt.

## 2023-11-11 NOTE — Interval H&P Note (Signed)
 History and Physical Interval Note:  11/11/2023 8:22 AM  Alexandria Fry  has presented today for surgery, with the diagnosis of left breast cancer.  The various methods of treatment have been discussed with the patient and family. After consideration of risks, benefits and other options for treatment, the patient has consented to  Procedure(s): BREAST LUMPECTOMY WITH RADIO FREQUENCY LOCALIZER (Left) BIOPSY, LYMPH NODE, SENTINEL, AXILLARY (Left) as a surgical intervention.  The patient's history has been reviewed, patient examined, no change in status, stable for surgery.  I have reviewed the patient's chart and labs.  Questions were answered to the patient's satisfaction.     Yahir Tavano F Doralee Kocak

## 2023-11-12 ENCOUNTER — Encounter: Payer: Self-pay | Admitting: Surgery

## 2023-11-12 NOTE — Anesthesia Postprocedure Evaluation (Signed)
 Anesthesia Post Note  Patient: Alexandria Fry  Procedure(s) Performed: BREAST LUMPECTOMY WITH RADIO FREQUENCY LOCALIZER (Left: Breast) BIOPSY, LYMPH NODE, SENTINEL, AXILLARY (Left)  Patient location during evaluation: PACU Anesthesia Type: General Level of consciousness: awake and alert Pain management: pain level controlled Vital Signs Assessment: post-procedure vital signs reviewed and stable Respiratory status: spontaneous breathing, nonlabored ventilation and respiratory function stable Cardiovascular status: blood pressure returned to baseline and stable Postop Assessment: no apparent nausea or vomiting Anesthetic complications: no   No notable events documented.   Last Vitals:  Vitals:   11/11/23 1505 11/11/23 1528  BP:  (!) 149/85  Pulse: 90 88  Resp: 13 15  Temp: 36.6 C   SpO2: 93% 95%    Last Pain:  Vitals:   11/11/23 1528  TempSrc:   PainSc: 3                  Camellia Merilee Louder

## 2023-11-13 ENCOUNTER — Ambulatory Visit
Admission: EM | Admit: 2023-11-13 | Discharge: 2023-11-13 | Disposition: A | Discharge status: Home / Self Care | Payer: Medicare (Managed Care)

## 2023-11-13 DIAGNOSIS — S2002XA Contusion of left breast, initial encounter: Secondary | ICD-10-CM | POA: Diagnosis not present

## 2023-11-13 NOTE — ED Provider Notes (Signed)
 Alexandria Fry    CSN: [id number] Arrival date & time: 11/13/23  1104      History   Chief Complaint Chief Complaint  Patient presents with   Wound Check    HPI Alexandria Fry is a 68 y.o. female.   Patient presents for evaluation of worsening bruising present to the left breast beginning this morning upon awakening.  Had lumpectomy completed 2 days ago which had minor bruising.  Endorses that she typically sleeps onto the stomach and therefore has an alternative she has attempted to sit up but is unsure if she rolled onto the side.  Breast is tender but denies severe pain.  Denies drainage or fever.  Has not attempted treatment.  Past Medical History:  Diagnosis Date   Allergic rhinitis    Diabetes mellitus without complication (HCC)    Difficult intubation    a.) limited cervical ROM; b.) anterior anatomical airway   Fatty liver    GERD (gastroesophageal reflux disease)    Hirsutism    Hypertension    Irregular menses    with neg endometrial biopsy 2013   Menorrhagia    Murmur, cardiac    PCOS (polycystic ovarian syndrome)    Pneumonia 2014   PONV (postoperative nausea and vomiting)    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)    Thyroid  nodule     Patient Active Problem List   Diagnosis Date Noted   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) 11/06/2023   Decreased range of motion of shoulder, left 10/10/2023   Right hip pain 06/13/2023   Healthcare maintenance 02/21/2023   Statin intolerance 02/21/2023   Rash 02/21/2023   Dysuria 02/21/2023   Family history of colorectal cancer 02/25/2022   Adenomatous polyp of colon 02/25/2022   Medicare welcome exam 12/28/2021   Lung nodule 05/19/2021   Respiratory failure with hypoxia (HCC) 05/06/2021   AKI (acute kidney injury) 05/06/2021   Hyperandrogenemia 05/27/2020   Multinodular goiter 05/27/2020   History of UTI 05/19/2020   Hammer toe of left foot 06/22/2019    Allergy history, drug 04/10/2018   Leg pain 04/10/2018   Status post laparotomy 11/11/2015   Diabetes mellitus without complication (HCC) 10/18/2015   Hyperglycemia 03/09/2015   Advance care planning 02/07/2014   Hyperlipidemia 02/07/2014   Atypical pneumonia 09/30/2012   Vitamin D  deficiency 10/09/2008   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   Overweight 01/18/2007   Essential hypertension 01/18/2007   Allergic rhinitis 11/29/2006   FIBROCYSTIC BREAST DISEASE 11/29/2006    Past Surgical History:  Procedure Laterality Date   ABDOMINAL HYSTERECTOMY     AXILLARY SENTINEL NODE BIOPSY Left 11/11/2023   Procedure: BIOPSY, LYMPH NODE, SENTINEL, AXILLARY;  Surgeon: Jordis Laneta FALCON, MD;  Location: [facility];  Service: General;  Laterality: Left;   BREAST BIOPSY Left 10/28/2023   u/s bx heart clip path pending   BREAST BIOPSY Left 10/28/2023   US  LT BREAST BX W LOC DEV 1ST LESION IMG BX SPEC US  GUIDE 10/28/2023 [facility]   BREAST BIOPSY Left 11/09/2023   US  LT BREAST SAVI/RF TAG 1ST LESION US  GUIDE 11/09/2023 [facility]   BREAST LUMPECTOMY WITH RADIO FREQUENCY LOCALIZER Left 11/11/2023   Procedure: BREAST LUMPECTOMY WITH RADIO FREQUENCY LOCALIZER;  Surgeon: Jordis Laneta FALCON, MD;  Location: [facility];  Service: General;  Laterality: Left;   CERVICAL CONE BIOPSY     COLONOSCOPY WITH PROPOFOL  N/A 01/18/2018   Procedure: COLONOSCOPY WITH PROPOFOL ;  Surgeon:  [name], MD;  Location: [facility];  Service: Endoscopy;  Laterality: N/A;   COLONOSCOPY WITH PROPOFOL  N/A 02/25/2022   Procedure: COLONOSCOPY WITH PROPOFOL ;  Surgeon: [name], MD;  Location: [facility];  Service: Gastroenterology;  Laterality: N/A;   CYSTOSCOPY  11/11/2015   Procedure: CYSTOSCOPY;  Surgeon: [name], MD;  Location: [facility];  Service: Gynecology;;   DILATATION & CURETTAGE/HYSTEROSCOPY WITH MYOSURE N/A 03/21/2015   Procedure: DILATATION & CURETTAGE/HYSTEROSCOPY, POLYPECTOMY;  Surgeon: [name],  MD;  Location: [facility];  Service: Gynecology;  Laterality: N/A;   DILATION AND CURETTAGE OF UTERUS  1981   [facility]    DILATION AND CURETTAGE OF UTERUS  06/25/2009   normal (Dr. [name])   LAPAROSCOPIC BILATERAL SALPINGECTOMY Bilateral 11/11/2015   Procedure: LAPAROSCOPIC BILATERAL SALPINGECTOMY;  Surgeon: [name], MD;  Location: [facility];  Service: Gynecology;  Laterality: Bilateral;   LAPAROSCOPIC SUPRACERVICAL HYSTERECTOMY  11/11/2015   Procedure: LAPAROSCOPIC SUPRACERVICAL HYSTERECTOMY CONVERTED TO OPEN;  Surgeon: [name], MD;  Location: [facility];  Service: Gynecology;;   LAPAROTOMY  11/11/2015   Procedure: LAPAROTOMY;  Surgeon: [name], MD;  Location: [facility];  Service: Gynecology;;   VAGINAL DELIVERY     NSVD x 2    OB History   No obstetric history on file.      Home Medications    Prior to Admission medications   Medication Sig Start Date End Date Taking? Authorizing Provider  empagliflozin  (JARDIANCE ) 10 MG TABS tablet Take 1 tablet (10 mg total) by mouth every other day. 08/16/23  Yes [name], MD  spironolactone  (ALDACTONE ) 50 MG tablet TAKE 1 TABLET(50 MG) BY MOUTH DAILY 02/18/23  Yes [name], MD  Cyanocobalamin (B-12) 3000 MCG CAPS Take 1 tablet by mouth daily. 02/18/23   [name], MD  hydrocortisone  (PROCTOZONE -HC) 2.5 % rectal cream Place 1 Application rectally 2 (two) times daily as needed for hemorrhoids or anal itching. 02/23/23   [name], MD  Multiple Vitamin (MULTIVITAMIN) tablet Take 1 tablet by mouth daily.    [provider]  traMADol  (ULTRAM ) 50 MG tablet Take 1 tablet (50 mg total) by mouth every 6 (six) hours as needed for moderate pain (pain score 4-6) or severe pain (pain score 7-10). 11/11/23   Pabon, Diego F, MD  triamcinolone  cream (KENALOG ) 0.5 % Apply 1 Application topically 2 (two) times daily as needed. 06/10/23   [name], MD  VITAMIN D , CHOLECALCIFEROL, PO Take 4,000 Units by mouth.     [provider]    Family History Family History  Problem Relation Age of Onset   Cancer Mother 32       breast with a mastectomy that was hormone receptor  and rectal cancer at age 41 with a protectomy, ileostomy and colectomy, multiple polyps   Diabetes Mother    Hypertension Mother    Colon cancer Mother    Breast cancer Mother 35   Osteoporosis Mother    Cancer Father        pancreatic   Colon polyps Father    Alcohol abuse Father    Heart disease Father    Early death Sister 19       suicide   Thyroid  disease Neg Hx     Social History Social History   Tobacco Use   Smoking status: Former    Current packs/day: 0.00    Average packs/day: 1 pack/day for 10.0 years (10.0 ttl  pk-yrs)    Types: Cigarettes    Start date: 01/21/1972    Quit date: 01/20/1982    Years since quitting: 41.8    Passive exposure: Past   Smokeless tobacco: Never   Tobacco comments:    quit for 28 years  Vaping Use   Vaping status: Never Used  Substance Use Topics   Alcohol use: Yes    Comment: ~6 drinks per week.   Drug use: No     Allergies   Amlodipine ; Amoxicillin ; Azithromycin; Hydrochlorothiazide; Penicillins; Pravachol  [pravastatin ]; Sulfa antibiotics; Ace inhibitors; Albuterol ; Angiotensin receptor blockers; Clonidine  derivatives; Diphenhydramine hcl; Fentanyl ; Hydrochlorothiazide-triamterene; Hydrocodone; Metformin and related; Metoprolol ; Nitrofurantoin; Oxycodone; Tetanus toxoid-containing vaccines; Cefuroxime axetil; Levofloxacin in d5w; and Tetanus toxoid, adsorbed   Review of Systems Review of Systems   Physical Exam Triage Vital Signs ED Triage Vitals  Encounter Vitals Group     BP 11/13/23 1130 (!) 155/78     Girls Systolic BP Percentile --      Girls Diastolic BP Percentile --      Boys Systolic BP Percentile --      Boys Diastolic BP Percentile --      Pulse Rate 11/13/23 1130 97     Resp 11/13/23 1130 18     Temp 11/13/23 1130 98.6 F (37 C)     Temp  Source 11/13/23 1130 Oral     SpO2 11/13/23 1130 95 %     Weight --      Height --      Head Circumference --      Peak Flow --      Pain Score 11/13/23 1129 3     Pain Loc --      Pain Education --      Exclude from Growth Chart --    No data found.  Updated Vital Signs BP (!) 155/78 (BP Location: Right Arm)   Pulse 97   Temp 98.6 F (37 C) (Oral)   Resp 18   LMP  (LMP Unknown)   SpO2 95%   Visual Acuity Right Eye Distance:   Left Eye Distance:   Bilateral Distance:    Right Eye Near:   Left Eye Near:    Bilateral Near:     Physical Exam Constitutional:      Appearance: Normal appearance.  Eyes:     Extraocular Movements: Extraocular movements intact.  Pulmonary:     Effort: Pulmonary effort is normal.  Neurological:     Mental Status: She is alert and oriented to person, place, and time.      UC Treatments / Results  Labs (all labs ordered are listed, but only abnormal results are displayed) Labs Reviewed - No data to display  EKG   Radiology No results found.  Procedures Procedures (including critical care time)  Medications Ordered in UC Medications - No data to display  Initial Impression / Assessment and Plan / UC Course  I have reviewed the triage vital signs and the nursing notes.  Pertinent labs & imaging results that were available during my care of the patient were reviewed by me and considered in my medical decision making (see chart for details).  Contusion of the left breast, initial encounter  Presentation consistent with contusion most likely pressure applied while sleeping, denies direct injury or pushing pulling or lifting, skin is warm to the touch but not hot and there is no drainage present at the incision site, appears to be healing appropriately low suspicion for infection, advised  to monitor, use pillows for cushioning and take Tylenol  and use ice for comfort, has upcoming follow-up appointment on October 8, to notify surgeon  in an attempt to be seen, given strict ER precautions Final Clinical Impressions(s) / UC Diagnoses   Final diagnoses:  Contusion of left breast, initial encounter     Discharge Instructions      Your evaluated for the bruising to your left breast which is consistent with a contusion meaning bruising underneath the skin, it is most likely related to a shifting pressure while you are simply and will improve with time  If the area becomes cold or cool to touch or hard please go to the nearest emergency department for immediate evaluation  If skin becomes hot to touch and more painful and you begin to experience a draining this is concerning for infection and you need to return for reevaluation  Please notify your surgeon of symptoms on Monday morning to see if you can be reevaluated sooner  May apply ice over the affected area 10 to 15-minute intervals  May take Tylenol  as needed for discomfort  Cushion the body with pillows whenever sitting and lying to help support the breast    ED Prescriptions   None    PDMP not reviewed this encounter.   Teresa Shelba SAUNDERS, NP 11/13/23 1224

## 2023-11-13 NOTE — ED Triage Notes (Signed)
 Patient states that she had a lumpectomy on left breast Thursday. Patient breast is bruised and she's concerned. Just wants to get it checked out. Patient states that the area doesn't really hurts. Its achy and tender when you press on it.

## 2023-11-13 NOTE — Discharge Instructions (Signed)
 Your evaluated for the bruising to your left breast which is consistent with a contusion meaning bruising underneath the skin, it is most likely related to a shifting pressure while you are simply and will improve with time  If the area becomes cold or cool to touch or hard please go to the nearest emergency department for immediate evaluation  If skin becomes hot to touch and more painful and you begin to experience a draining this is concerning for infection and you need to return for reevaluation  Please notify your surgeon of symptoms on Monday morning to see if you can be reevaluated sooner  May apply ice over the affected area 10 to 15-minute intervals  May take Tylenol  as needed for discomfort  Cushion the body with pillows whenever sitting and lying to help support the breast

## 2023-11-15 ENCOUNTER — Telehealth: Payer: Self-pay

## 2023-11-15 NOTE — Telephone Encounter (Signed)
 Pt is having swelling and redness and will come see Dr. Jordis on 11/17/2023.

## 2023-11-16 ENCOUNTER — Encounter: Payer: Self-pay | Admitting: *Deleted

## 2023-11-16 ENCOUNTER — Other Ambulatory Visit: Payer: Self-pay | Admitting: Pathology

## 2023-11-16 LAB — SURGICAL PATHOLOGY

## 2023-11-16 NOTE — Progress Notes (Signed)
 [facility] Dx order [id number] submitted online

## 2023-11-17 ENCOUNTER — Ambulatory Visit: Payer: Medicare (Managed Care) | Admitting: Surgery

## 2023-11-17 ENCOUNTER — Inpatient Hospital Stay: Payer: Medicare (Managed Care) | Attending: Oncology | Admitting: Hospice and Palliative Medicine

## 2023-11-17 ENCOUNTER — Encounter: Payer: Self-pay | Admitting: Surgery

## 2023-11-17 VITALS — BP 175/91 | HR 103 | Temp 98.8°F | Ht 65.0 in | Wt 202.4 lb

## 2023-11-17 DIAGNOSIS — C50412 Malignant neoplasm of upper-outer quadrant of left female breast: Secondary | ICD-10-CM

## 2023-11-17 DIAGNOSIS — C50912 Malignant neoplasm of unspecified site of left female breast: Secondary | ICD-10-CM

## 2023-11-17 DIAGNOSIS — Z09 Encounter for follow-up examination after completed treatment for conditions other than malignant neoplasm: Secondary | ICD-10-CM

## 2023-11-17 DIAGNOSIS — Z17411 Hormone receptor positive with human epidermal growth factor receptor 2 negative status: Secondary | ICD-10-CM | POA: Insufficient documentation

## 2023-11-17 DIAGNOSIS — M85852 Other specified disorders of bone density and structure, left thigh: Secondary | ICD-10-CM | POA: Insufficient documentation

## 2023-11-17 DIAGNOSIS — Z79811 Long term (current) use of aromatase inhibitors: Secondary | ICD-10-CM | POA: Insufficient documentation

## 2023-11-17 DIAGNOSIS — Z08 Encounter for follow-up examination after completed treatment for malignant neoplasm: Secondary | ICD-10-CM

## 2023-11-17 NOTE — Patient Instructions (Signed)
 Lumpectomy, Care After The following information offers guidance on how to care for yourself after your procedure. Your health care provider may also give you more specific instructions. If you have problems or questions, contact your health care provider. What can I expect after the procedure? After the procedure, it is common to have: Some pain or redness at the incision site. Breast swelling. Breast tenderness. Stiffness in your arm or shoulder. A change in the shape and feel of your breast. Scar tissue that feels hard to the touch in the area where the lump was removed. Follow these instructions at home: Medicines Take over-the-counter and prescription medicines only as told by your health care provider. If you were prescribed an antibiotic, take it as told by your health care provider. Do not stop taking the antibiotic even if you start to feel better. Ask your health care provider if the medicine prescribed to you: Requires you to avoid driving or using machinery. Can cause constipation. You may need to take these actions to prevent or treat constipation: Drink enough fluid to keep your urine pale yellow. Take over-the-counter or prescription medicines. Eat foods that are high in fiber, such as beans, whole grains, and fresh fruits and vegetables. Limit foods that are high in fat and processed sugars, such as fried or sweet foods. Incision care     Follow instructions from your health care provider about how to take care of your incision. Make sure you: Wash your hands with soap and water for at least 20 seconds before and after you change your bandage (dressing). If soap and water are not available, use hand sanitizer. Change your dressing as told by your health care provider. Leave stitches (sutures), skin glue, or adhesive strips in place. These skin closures may need to stay in place for 2 weeks or longer. If adhesive strip edges start to loosen and curl up, you may trim the  loose edges. Do not remove adhesive strips completely unless your health care provider tells you to do that. Check your incision area every day for signs of infection. Check for: More redness, swelling, or pain. Fluid or blood. Warmth. Pus or a bad smell. Keep your dressing clean and dry. If you were sent home with a surgical drain in place, follow instructions from your health care provider about emptying it. Bathing Do not take baths, swim, or use a hot tub until your health care provider approves. Ask your health care provider if you may take showers. You may only be allowed to take sponge baths. Activity Rest as told by your health care provider. Do not sit for a long time without moving. Get up to take short walks every 1-2 hours. This will improve blood flow and breathing. Ask for help if you feel weak or unsteady. Be careful to avoid any activities that could cause an injury to your arm on the side of your surgery. Do not lift anything that is heavier than 10 lb (4.5 kg), or the limit that you are told, until your health care provider says that it is safe. Avoid lifting with the arm that is on the side of your surgery. Do not carry heavy objects on your shoulder on the side of your surgery. Do exercises to keep your shoulder and arm from getting stiff and swollen. Talk with your health care provider about which exercises are safe for you. Return to your normal activities as told by your health care provider. Ask your health care provider what activities  are safe for you. General instructions Wear a supportive bra as told by your health care provider. Raise (elevate) your arm above the level of your heart while you are sitting or lying down. Do not wear tight jewelry on your arm, wrist, or fingers on the side of your surgery. Wear compression stockings as told by your health care provider. These stockings help to prevent blood clots and reduce swelling in your legs. If you had any lymph  nodes removed during your procedure, be sure to tell all of your health care providers. It is important to share this information before you have certain procedures, such as blood tests or blood pressure measurements. Keep all follow-up visits. You may need to be screened for extra fluid around the lymph nodes and swelling in the breast and arm (lymphedema). Contact a health care provider if: You develop a rash. You have a fever. Your pain worsens or pain medicine is not working. You have swelling, weakness, or numbness in your arm that does not improve after a few weeks. You have new swelling in your breast. You have any of these signs of infection: More redness, swelling, or pain in your incision area. Fluid or blood coming from your incision. Warmth coming from the incision area. Pus or a bad smell coming from your incision. Get help right away if: You have very bad pain in your breast or arm. You have swelling in your legs or arms. You have redness, warmth, or pain in your leg or arm. You have chest pain. You have difficulty breathing. These symptoms may be an emergency. Get help right away. Call 911. Do not wait to see if the symptoms will go away. Do not drive yourself to the hospital. Summary After the procedure, it is common to have breast tenderness, swelling in your breast, and stiffness in your arm and shoulder. Follow instructions from your health care provider about how to take care of your incision. Do not lift anything that is heavier than 10 lb (4.5 kg), or the limit that you are told, until your health care provider says that it is safe. Avoid lifting with the arm that is on the side of your surgery. If you had any lymph nodes removed during your procedure, be sure to tell all of your health care providers. This information is not intended to replace advice given to you by your health care provider. Make sure you discuss any questions you have with your health care  provider. Document Revised: 04/13/2021 Document Reviewed: 04/13/2021 Elsevier Patient Education  2024 [employer].

## 2023-11-17 NOTE — Progress Notes (Signed)
 Outpatient Surgical Follow Up  11/17/2023  Alexandria Fry is an 68 y.o. female.   Chief Complaint  Patient presents with   Routine Post Op    Left breast lumpectomy     HPI: s/p left lumpectomy SLNB, micromets in 1 node, negative margins. She is doing ok, developed a large echymosis on surgical site and chest wall  Past Medical History:  Diagnosis Date   Allergic rhinitis    Diabetes mellitus without complication (HCC)    Difficult intubation    a.) limited cervical ROM; b.) anterior anatomical airway   Fatty liver    GERD (gastroesophageal reflux disease)    Hirsutism    Hypertension    Irregular menses    with neg endometrial biopsy 2013   Menorrhagia    Murmur, cardiac    PCOS (polycystic ovarian syndrome)    Pneumonia 2014   PONV (postoperative nausea and vomiting)    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)    Thyroid  nodule     Past Surgical History:  Procedure Laterality Date   ABDOMINAL HYSTERECTOMY     AXILLARY SENTINEL NODE BIOPSY Left 11/11/2023   Procedure: BIOPSY, LYMPH NODE, SENTINEL, AXILLARY;  Surgeon: Jordis Laneta FALCON, MD;  Location: [facility];  Service: General;  Laterality: Left;   BREAST BIOPSY Left 10/28/2023   u/s bx heart clip path pending   BREAST BIOPSY Left 10/28/2023   US  LT BREAST BX W LOC DEV 1ST LESION IMG BX SPEC US  GUIDE 10/28/2023 [facility]   BREAST BIOPSY Left 11/09/2023   US  LT BREAST SAVI/RF TAG 1ST LESION US  GUIDE 11/09/2023 [facility]   BREAST LUMPECTOMY WITH RADIO FREQUENCY LOCALIZER Left 11/11/2023   Procedure: BREAST LUMPECTOMY WITH RADIO FREQUENCY LOCALIZER;  Surgeon: Jordis Laneta FALCON, MD;  Location: [facility];  Service: General;  Laterality: Left;   CERVICAL CONE BIOPSY     COLONOSCOPY WITH PROPOFOL  N/A 01/18/2018   Procedure: COLONOSCOPY WITH PROPOFOL ;  Surgeon: [name], MD;  Location: [facility] ENDOSCOPY;  Service: Endoscopy;  Laterality: N/A;   COLONOSCOPY WITH PROPOFOL  N/A 02/25/2022    Procedure: COLONOSCOPY WITH PROPOFOL ;  Surgeon: [name], MD;  Location: [facility] ENDOSCOPY;  Service: Gastroenterology;  Laterality: N/A;   CYSTOSCOPY  11/11/2015   Procedure: CYSTOSCOPY;  Surgeon: [name], MD;  Location: [facility]S;  Service: Gynecology;;   DILATATION & CURETTAGE/HYSTEROSCOPY WITH MYOSURE N/A 03/21/2015   Procedure: DILATATION & CURETTAGE/HYSTEROSCOPY, POLYPECTOMY;  Surgeon: [name], MD;  Location: [facility];  Service: Gynecology;  Laterality: N/A;   DILATION AND CURETTAGE OF UTERUS  1981   [facility]    DILATION AND CURETTAGE OF UTERUS  06/25/2009   normal (Dr. [name])   LAPAROSCOPIC BILATERAL SALPINGECTOMY Bilateral 11/11/2015   Procedure: LAPAROSCOPIC BILATERAL SALPINGECTOMY;  Surgeon: [name], MD;  Location: [facility];  Service: Gynecology;  Laterality: Bilateral;   LAPAROSCOPIC SUPRACERVICAL HYSTERECTOMY  11/11/2015   Procedure: LAPAROSCOPIC SUPRACERVICAL HYSTERECTOMY CONVERTED TO OPEN;  Surgeon: [name], MD;  Location: [facility];  Service: Gynecology;;   LAPAROTOMY  11/11/2015   Procedure: LAPAROTOMY;  Surgeon: [name], MD;  Location: [facility];  Service: Gynecology;;   VAGINAL DELIVERY     NSVD x 2    Family History  Problem Relation Age of Onset   Cancer Mother 89       breast with a mastectomy that was hormone receptor  and rectal cancer at age 74 with a protectomy, ileostomy and colectomy, multiple polyps  Diabetes Mother    Hypertension Mother    Colon cancer Mother    Breast cancer Mother 45   Osteoporosis Mother    Cancer Father        pancreatic   Colon polyps Father    Alcohol abuse Father    Heart disease Father    Early death Sister 27       suicide   Thyroid  disease Neg Hx     Social History:  reports that she quit smoking about 41 years ago. Her smoking use included cigarettes. She started smoking about 51 years ago. She has a 10 pack-year smoking history. She has been exposed to tobacco smoke. She has never used  smokeless tobacco. She reports current alcohol use. She reports that she does not use drugs.  Allergies:  Allergies  Allergen Reactions   Amlodipine  Other (See Comments) and Swelling    edema   Amoxicillin  Anaphylaxis   Azithromycin Rash   Hydrochlorothiazide Other (See Comments)    H/o sulfa allergy   Penicillins Other (See Comments)    Tongue swells    Pravachol  [Pravastatin ] Diarrhea and Nausea And Vomiting    Tongue swelling   Sulfa Antibiotics Other (See Comments)    Tongue swells   Ace Inhibitors Other (See Comments)    Would avoid.  She has h/o lip swelling with other meds.    Albuterol      Pulse elevation.   Angiotensin Receptor Blockers Other (See Comments)    Would avoid.  She has h/o lip swelling with other meds.    Clonidine  Derivatives Other (See Comments)    bradycardia   Diphenhydramine Hcl     Benadryl cream causes local skin irritation.  Would avoid med.  She can tolerate zyrtec.    Fentanyl  Other (See Comments)    She felt diffusely awful while on med   Hydrochlorothiazide-Triamterene     Possible hives and GI upset   Hydrocodone     intolerant   Metformin And Related     rash   Metoprolol      Bradycardia.   Nitrofurantoin Other (See Comments)    rash   Oxycodone Other (See Comments)    intolerant   Tetanus Toxoid-Containing Vaccines     Local swelling, can consider split dosing 2 weeks apart   Cefuroxime Axetil Rash    Rash   Levofloxacin In D5w Itching    Severe itching and flushing.   Tetanus Toxoid, Adsorbed Rash    Medications reviewed.    ROS Full ROS performed and is otherwise negative other than what is stated in HPI   BP (!) 175/91   Pulse (!) 103   Temp 98.8 F (37.1 C) (Oral)   Ht 5' 5 (1.651 m)   Wt 202 lb 6.4 oz (91.8 kg)   LMP  (LMP Unknown)   SpO2 95%   BMI 33.68 kg/m   Physical Exam NAD chaperone present INcision healing well, ecchymosis on left breast and chest wall, it is soft, not indurated but it is large .  No evidence of infection or lymphedema  Assessment/Plan: Doing well overall w ecchymosis due to capillary and skin fragility No evidence of complications RTC 2 weeks She will f/u onc about the discussion of oncotype and discussion about chemo needs   Laneta Luna, MD [facility] General Surgeon

## 2023-11-17 NOTE — Progress Notes (Signed)
 Multidisciplinary Oncology Council Documentation  Alexandria Fry was presented by our [facility] on 11/17/2023, which included representatives from:  Palliative Care Dietitian  Physical/Occupational Therapist Nurse Navigator Genetics Social work Survivorship RN Financial Navigator Research RN   Alexandria Fry currently presents with history of breast cancer  We reviewed previous medical and familial history, history of present illness, and recent lab results along with all available histopathologic and imaging studies. The MOC considered available treatment options and made the following recommendations/referrals:  SW, Rehab screening, nutrition  The MOC is a meeting of clinicians from various specialty areas who evaluate and discuss patients for whom a multidisciplinary approach is being considered. Final determinations in the plan of care are those of the provider(s).   Today's extended care, comprehensive team conference, Alexandria Fry was not present for the discussion and was not examined.

## 2023-11-18 ENCOUNTER — Inpatient Hospital Stay: Payer: Medicare (Managed Care)

## 2023-11-18 ENCOUNTER — Encounter: Payer: Medicare (Managed Care) | Admitting: Licensed Clinical Social Worker

## 2023-11-18 ENCOUNTER — Telehealth: Payer: Self-pay

## 2023-11-18 NOTE — Progress Notes (Signed)
 [facility] Clinical Social Work  Initial Assessment   Alexandria Fry is a 68 y.o. year old female contacted by phone. Clinical Social Work was referred by [facility] for assessment of psychosocial needs.   SDOH (Social Determinants of Health) assessments performed: No SDOH Interventions    Flowsheet Row Nutrition from 08/16/2023 in [facility] at [facility] Clinical Support from 12/22/2022 in [facility] [place]  SDOH Interventions    Food Insecurity Interventions Intervention Not Indicated --  Health Literacy Interventions -- Intervention Not Indicated    SDOH Screenings   Food Insecurity: No Food Insecurity (11/05/2023)  Housing: Low Risk  (11/05/2023)  Transportation Needs: No Transportation Needs (11/05/2023)  Utilities: Not At Risk (11/05/2023)  Alcohol Screen: Low Risk  (08/12/2023)  Depression (PHQ2-9): Low Risk  (11/05/2023)  Financial Resource Strain: Low Risk  (08/12/2023)  Physical Activity: Insufficiently Active (08/12/2023)  Social Connections: Socially Isolated (08/12/2023)  Stress: No Stress Concern Present (08/12/2023)  Tobacco Use: Medium Risk (11/17/2023)  Health Literacy: Adequate Health Literacy (12/22/2022)    PHQ 2/9:    11/05/2023    2:51 PM 11/05/2023    2:42 PM 10/08/2023   11:35 AM  Depression screen PHQ 2/9  Decreased Interest 0 0 0  Down, Depressed, Hopeless 0 0 0  PHQ - 2 Score 0 0 0  Altered sleeping   2  Tired, decreased energy   1  Change in appetite   1  Feeling bad or failure about yourself    0  Trouble concentrating   0  Moving slowly or fidgety/restless   0  Suicidal thoughts   0  PHQ-9 Score   4  Difficult doing work/chores   Not difficult at all     Distress Screen completed: No    11/05/2023    2:46 PM  ONCBCN DISTRESS SCREENING  Screening Type Initial Screening  How much distress have you been experiencing in the past week? (0-10) 0      Family/Social Information:   Housing Arrangement: patient lives with husband and adult son.  Family members/support persons in your life? Family, including husband, two sons, and a brother who lives in [place]. Transportation concerns: no  Employment: Retired .  Income source: Actor concerns: No Type of concern: None Food access concerns: no Religious or spiritual practice: Not known Advanced directives: No Services Currently in place:  [employer]   Coping/ Adjustment to diagnosis: Patient understands treatment plan and what happens next? yes Concerns about diagnosis and/or treatment: Pt reports she is not overly concerned about any aspect of her cancer/ tx, although she is waiting to find out whether or not she will have chemo and hopes she will not. Pt also not looking forward to 5 years of hormone therapy post active tx. Patient reported stressors: Pt reports no significant stressors at this time. She has a very positive and pragmatic attitude towards her diagnosis. Pt lost both parents to cancer but understands that her situation is very different from theirs. Current coping skills/ strengths: Ability for insight , Active sense of humor , Average or above average intelligence , Financial means , and Supportive family/friends     SUMMARY: Current SDOH Barriers:  None at this time.  Clinical Social Work Clinical Goal(s):  No clinical social work goals at this time  Interventions: Discussed common feeling and emotions when being diagnosed with cancer, and the importance of support during treatment Informed patient  of the support team roles and support services at [facility] Provided CSW contact information and encouraged patient to call with any questions or concerns  Follow Up Plan: Patient will contact CSW with any support or resource needs Patient verbalizes understanding of plan: Yes    Thersia KATHEE Daring Clinical Social Work Intern [employer]

## 2023-11-18 NOTE — Telephone Encounter (Signed)
 Pt called and requested differnet day for appt - said she felt like she would be overwhelmed having so much info on the same day - r/s appt with pt - LH

## 2023-11-23 ENCOUNTER — Ambulatory Visit: Payer: Self-pay | Admitting: Family Medicine

## 2023-11-23 ENCOUNTER — Encounter: Payer: Self-pay | Admitting: Oncology

## 2023-11-23 ENCOUNTER — Ambulatory Visit: Payer: Self-pay | Admitting: Oncology

## 2023-11-24 ENCOUNTER — Encounter: Payer: Medicare (Managed Care) | Admitting: Surgery

## 2023-11-24 ENCOUNTER — Encounter: Payer: Self-pay | Admitting: *Deleted

## 2023-11-24 NOTE — Progress Notes (Signed)
 Called Alexandria Fry to give oncotype result of 5, no chemo needed.   She will see Dr. Melanee on 10/14.  She did not want to schedule appointment with Dr. Chrystal until after she sees Dr. Melanee and Dr. Jordis due to wound healing issues.

## 2023-11-24 NOTE — Telephone Encounter (Signed)
 Copied from CRM #[id number]. Topic: General - Call Back - No Documentation >> Nov 23, 2023  5:55 PM Shereese L wrote: Reason for CRM: patient is returning office call and stated that she's doing fine and if he still wants to call back she's is avail

## 2023-11-26 ENCOUNTER — Ambulatory Visit: Payer: Medicare (Managed Care) | Admitting: Oncology

## 2023-11-29 ENCOUNTER — Other Ambulatory Visit: Payer: Medicare (Managed Care)

## 2023-11-29 NOTE — Progress Notes (Signed)
 Tumor Board Documentation  Alexandria Fry was presented by Shasta Ada, RN at our Tumor Board on 11/29/2023, which included representatives from medical oncology, radiation oncology, surgical, pathology, radiology, navigation, research.  Alexandria Fry currently presents as a current patient with history of the following treatments: surgical intervention(s).  Additionally, we reviewed previous medical and familial history, history of present illness, and recent lab results along with all available histopathologic and imaging studies. The tumor board considered available treatment options and made the following recommendations: Adjuvant radiation, Hormonal therapy    The following procedures/referrals were also placed: No orders of the defined types were placed in this encounter.   Clinical Trial Status: not discussed   Staging used: AJCC Staging: T: T1c N: N23mi (SN)        National site-specific guidelines   were discussed with respect to the case.  Tumor board is a meeting of clinicians from various specialty areas who evaluate and discuss patients for whom a multidisciplinary approach is being considered. Final determinations in the plan of care are those of the provider(s). The responsibility for follow up of recommendations given during tumor board is that of the provider.   Today's extended care, comprehensive team conference, Alexandria Fry was not present for the discussion and was not examined.   Multidisciplinary Tumor Board is a multidisciplinary case peer review process.  Decisions discussed in the Multidisciplinary Tumor Board reflect the opinions of the specialists present at the conference without having examined the patient.  Ultimately, treatment and diagnostic decisions rest with the primary provider(s) and the patient.

## 2023-11-30 ENCOUNTER — Inpatient Hospital Stay (HOSPITAL_BASED_OUTPATIENT_CLINIC_OR_DEPARTMENT_OTHER): Payer: Medicare (Managed Care) | Admitting: Licensed Clinical Social Worker

## 2023-11-30 ENCOUNTER — Encounter: Payer: Self-pay | Admitting: Oncology

## 2023-11-30 ENCOUNTER — Telehealth: Payer: Self-pay | Admitting: Licensed Clinical Social Worker

## 2023-11-30 ENCOUNTER — Inpatient Hospital Stay: Payer: Medicare (Managed Care) | Admitting: Oncology

## 2023-11-30 ENCOUNTER — Encounter: Payer: Self-pay | Admitting: Licensed Clinical Social Worker

## 2023-11-30 ENCOUNTER — Other Ambulatory Visit: Payer: Self-pay | Admitting: Oncology

## 2023-11-30 VITALS — BP 167/91 | HR 92 | Temp 99.1°F | Resp 16 | Wt 203.0 lb

## 2023-11-30 DIAGNOSIS — Z7189 Other specified counseling: Secondary | ICD-10-CM

## 2023-11-30 DIAGNOSIS — C50412 Malignant neoplasm of upper-outer quadrant of left female breast: Secondary | ICD-10-CM | POA: Diagnosis present

## 2023-11-30 DIAGNOSIS — C50912 Malignant neoplasm of unspecified site of left female breast: Secondary | ICD-10-CM | POA: Diagnosis not present

## 2023-11-30 DIAGNOSIS — Z17411 Hormone receptor positive with human epidermal growth factor receptor 2 negative status: Secondary | ICD-10-CM | POA: Diagnosis not present

## 2023-11-30 DIAGNOSIS — M85852 Other specified disorders of bone density and structure, left thigh: Secondary | ICD-10-CM | POA: Diagnosis not present

## 2023-11-30 DIAGNOSIS — Z79811 Long term (current) use of aromatase inhibitors: Secondary | ICD-10-CM | POA: Diagnosis not present

## 2023-11-30 MED ORDER — LETROZOLE 2.5 MG PO TABS: 2.5000 mg | ORAL_TABLET | Freq: Every day | ORAL | 0 refills | Status: DC

## 2023-11-30 NOTE — Progress Notes (Signed)
 REFERRING PROVIDER: Melanee Annah BROCKS, MD [address] [place],  [place] [address]  PRIMARY PROVIDER:  [name], MD  PRIMARY REASON FOR VISIT:  No diagnosis found.   HISTORY OF PRESENT ILLNESS:   Alexandria Fry, a 68 y.o. female, was seen for a [facility] cancer genetics consultation at the request of Dr. Melanee due to a personal and family history of cancer.  Alexandria Fry presents to clinic today to discuss the possibility of a hereditary predisposition to cancer, genetic testing, and to further clarify her future cancer risks, as well as potential cancer risks for family members.   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 She underwent lumpectomy on 9/25 and is planning for adjuvant radiation and adjuvant endocrine therapy.   RELEVANT MEDICAL HISTORY:  Menarche was at age 89.  Ovaries intact: yes.  Hysterectomy: yes. Menopausal status: postmenopausal.  HRT use: 1.5 years. Colonoscopy: yes; cumulative 7 polyps.   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)    Thyroid  nodule     Past Surgical History:  Procedure Laterality Date   ABDOMINAL HYSTERECTOMY     AXILLARY SENTINEL NODE BIOPSY Left 11/11/2023   Procedure: BIOPSY, LYMPH NODE, SENTINEL, AXILLARY;  Surgeon: Jordis Laneta FALCON, MD;  Location: [facility];  Service: General;  Laterality: Left;   BREAST BIOPSY Left 10/28/2023   u/s bx heart clip path pending   BREAST BIOPSY Left 10/28/2023   US  LT BREAST BX W LOC DEV 1ST LESION IMG BX SPEC US  GUIDE 10/28/2023 [facility]   BREAST BIOPSY Left 11/09/2023   US  LT BREAST SAVI/RF TAG 1ST LESION US  GUIDE 11/09/2023 [facility]   BREAST LUMPECTOMY WITH RADIO FREQUENCY LOCALIZER Left 11/11/2023   Procedure: BREAST LUMPECTOMY WITH RADIO FREQUENCY LOCALIZER;  Surgeon: Jordis Laneta FALCON, MD;  Location: [facility];  Service: General;  Laterality: Left;   CERVICAL CONE BIOPSY     COLONOSCOPY WITH PROPOFOL  N/A 01/18/2018   Procedure: COLONOSCOPY WITH PROPOFOL ;  Surgeon: [name], MD;  Location: [facility] END[facility]COPY;  Service: Endoscopy;  Laterality: N/A;   COLONOSCOPY WITH PROPOFOL  N/A 02/25/2022   Procedure: COLONOSCOPY WITH PROPOFOL ;  Surgeon: [name], MD;  Location: [facility] ENDOSCOPY;  Service: Gastroenterology;  Laterality: N/A;   CYSTOSCOPY  11/11/2015   Procedure: CYSTOSCOPY;  Surgeon: [name], MD;  Location: [facility];  Service: Gynecology;;  DILATATION & CURETTAGE/HYSTEROSCOPY WITH MYOSURE N/A 03/21/2015   Procedure: DILATATION & CURETTAGE/HYSTEROSCOPY, POLYPECTOMY;  Surgeon: [name], MD;  Location: [facility];  Service: Gynecology;  Laterality: N/A;   DILATION AND CURETTAGE OF UTERUS  1981   [facility]    DILATION AND CURETTAGE OF UTERUS  06/25/2009   normal (Dr. [name])   LAPAROSCOPIC BILATERAL SALPINGECTOMY Bilateral 11/11/2015   Procedure: LAPAROSCOPIC BILATERAL SALPINGECTOMY;  Surgeon: [name], MD;   Location: [facility];  Service: Gynecology;  Laterality: Bilateral;   LAPAROSCOPIC SUPRACERVICAL HYSTERECTOMY  11/11/2015   Procedure: LAPAROSCOPIC SUPRACERVICAL HYSTERECTOMY CONVERTED TO OPEN;  Surgeon: [name], MD;  Location: [facility];  Service: Gynecology;;   LAPAROTOMY  11/11/2015   Procedure: LAPAROTOMY;  Surgeon: [name], MD;  Location: [facility];  Service: Gynecology;;   VAGINAL DELIVERY     NSVD x 2    FAMILY HISTORY:  We obtained a detailed, 4-generation family history.  Significant diagnoses are listed below: Family History  Problem Relation Age of Onset   Diabetes Mother    Hypertension Mother    Colon cancer Mother 33   Breast cancer Mother 49   Osteoporosis Mother    Pancreatic cancer Father 74   Colon polyps Father    Alcohol abuse Father    Heart disease Father    Early death Sister 44       suicide   Thyroid  disease Neg Hx    Alexandria Fry has 2 sons, 69 and 33. She has 1 brother living at 66 and 1 sister who passed at 78.   Alexandria Fry mother had breast cancer at 76 and colon at 24, she passed at 59. No other known cancers on this side of the family.  Alexandria Fry father had metastatic pancreatic cancer and passed at 36. No other known cancers on this side of the family.   Alexandria Fry is unaware of previous family history of genetic testing for hereditary cancer risks. There is no reported Ashkenazi Jewish ancestry. There is no known consanguinity.    GENETIC COUNSELING ASSESSMENT: Alexandria Fry is a 68 y.o. female with a personal and family history of cancer which is somewhat suggestive of a hereditary cancer syndrome and predisposition to cancer. We, therefore, discussed and recommended the following at today's visit.   DISCUSSION: We discussed that, in general, most cancer is not inherited in families, but instead is sporadic or familial.  We discussed that approximately 10% of breast cancer is hereditary. Most cases of hereditary breast cancer are  associated with BRCA1/BRCA2 genes, although there are other genes associated with hereditary cancer as well. Cancers and risks are gene specific. We discussed that testing is beneficial for several reasons including knowing about cancer risks, identifying potential screening and risk-reduction options that may be appropriate, and to understand if other family members could be at risk for cancer and allow them to undergo genetic testing.   We reviewed the characteristics, features and inheritance patterns of hereditary cancer syndromes. We also discussed genetic testing, including the appropriate family members to test, the process of testing, insurance coverage and turn-around-time for results. We discussed the implications of a negative, positive and/or variant of uncertain significant result. Alexandria Fry had blood drawn for genetic testing at her initial oncology appointment.   GENETIC TEST RESULTS:  The [facility] CancerNext-Expanded+RNA Panel found no pathogenic mutations.   The CancerNext-Expanded gene panel offered by [facility] and includes sequencing, rearrangement, and RNA analysis for the following 77 genes: AIP,  ALK, APC, ATM, AXIN2, BAP1, BARD1, BMPR1A, BRCA1, BRCA2, BRIP1, CDC73, CDH1, CDK4, CDKN1B, CDKN2A, CEBPA, CHEK2, CTNNA1, DDX41, DICER1, ETV6, FH, FLCN, GATA2, LZTR1, MAX, MBD4, MEN1, MET, MLH1, MSH2, MSH3, MSH6, MUTYH, NF1, NF2, NTHL1, PALB2, PHOX2B, PMS2, POT1, PRKAR1A, PTCH1, PTEN, RAD51C, RAD51D, RB1, RET, RPS20, RUNX1, SDHA, SDHAF2, SDHB, SDHC, SDHD, SMAD4, SMARCA4, SMARCB1, SMARCE1, STK11, SUFU, TMEM127, TP53, TSC1, TSC2, VHL, and WT1 (sequencing and deletion/duplication); EGFR, HOXB13, KIT, MITF, PDGFRA, POLD1, and POLE (sequencing only); EPCAM and GREM1 (deletion/duplication only).   The test report has been scanned into EPIC and is located under the Molecular Pathology section of the Results Review tab.  A portion of the result report is included below for reference. Genetic testing  reported out on 11/18/2023.       Even though a pathogenic variant was not identified, possible explanations for the cancer in the family may include: There may be no hereditary risk for cancer in the family. The cancers in Alexandria Fry and/or her family may be sporadic/familial or due to other genetic and environmental factors. There may be a gene mutation in one of these genes that current testing methods cannot detect but that chance is small. There could be another gene that has not yet been discovered, or that we have not yet tested, that is responsible for the cancer diagnoses in the family.  It is also possible there is a hereditary cause for the cancer in the family that Alexandria Fry did not inherit. Therefore, it is important to remain in touch with cancer genetics in the future so that we can continue to offer Alexandria Fry the most up to date genetic testing.   ADDITIONAL GENETIC TESTING:  We discussed with Alexandria Fry that her genetic testing was fairly extensive.  If there are additional relevant genes identified to increase cancer risk that can be analyzed in the future, we would be happy to discuss and coordinate this testing at that time.    CANCER SCREENING RECOMMENDATIONS:  Alexandria Fry test result is considered negative (normal).  This means that we have not identified a hereditary cause for her personal and family history of cancer at this time.   An individual's cancer risk and medical management are not determined by genetic test results alone. Overall cancer risk assessment incorporates additional factors, including personal medical history, family history, and any available genetic information that may result in a personalized plan for cancer prevention and surveillance. Therefore, it is recommended she continue to follow the cancer management and screening guidelines provided by her oncology and primary healthcare provider.  RECOMMENDATIONS FOR FAMILY MEMBERS:   Since she did not  inherit a identifiable mutation in a cancer predisposition gene included on this panel, her children could not have inherited a known mutation from her in one of these genes. Individuals in this family might be at some increased risk of developing cancer, over the general population risk, due to the family history of cancer.  Individuals in the family should notify their providers of the family history of cancer. We recommend women in this family have a yearly mammogram beginning at age 32, or 31 years younger than the earliest onset of cancer, an annual clinical breast exam, and perform monthly breast self-exams.  Family members should have colonoscopies by at age 67, or earlier, as recommended by their providers. Other members of the family may still carry a pathogenic variant in one of these genes that Alexandria Fry did not inherit. Based on the family history of  pancreatic cancer in her father, her brother could consider genetic counseling/testing. Alexandria Fry will let us  know if we can be of any assistance in coordinating genetic counseling and/or testing for this family member.     FOLLOW-UP:  Lastly, we discussed with Alexandria Fry that cancer genetics is a rapidly advancing field and it is possible that new genetic tests will be appropriate for her and/or her family members in the future. We encouraged her to remain in contact with cancer genetics on an annual basis so we can update her personal and family histories and let her know of advances in cancer genetics that may benefit this family.   Our contact number was provided. Alexandria Fry questions were answered to her satisfaction, and she knows she is welcome to call us  at anytime with additional questions or concerns.   Dena Cary, MS, [facility] Genetic Counselor [email or web address] Phone: [phone]  I personally spent a total of 30 minutes in the care of the patient today including counseling and educating, documenting clinical  information in the EHR, and communicating results. Patient's husband was also present.    _______________________________________________________________________ For Office Staff:  Number of people involved in session: 2 Was an Intern/ student involved with case: no

## 2023-11-30 NOTE — Telephone Encounter (Signed)
 Called patient to let her know she could apply for [employer] for her genetic testing cost if she would like -  [email or web address]    Dena Cary, MS, [facility] Genetic Counselor [email or web address] Phone: [phone]

## 2023-12-01 ENCOUNTER — Encounter: Payer: Self-pay | Admitting: Surgery

## 2023-12-01 ENCOUNTER — Encounter: Payer: Self-pay | Admitting: Oncology

## 2023-12-01 ENCOUNTER — Ambulatory Visit (INDEPENDENT_AMBULATORY_CARE_PROVIDER_SITE_OTHER): Payer: Medicare (Managed Care) | Admitting: Surgery

## 2023-12-01 VITALS — BP 155/83 | HR 102 | Temp 98.5°F | Ht 65.0 in | Wt 200.0 lb

## 2023-12-01 DIAGNOSIS — N179 Acute kidney failure, unspecified: Secondary | ICD-10-CM

## 2023-12-01 DIAGNOSIS — N644 Mastodynia: Secondary | ICD-10-CM

## 2023-12-01 DIAGNOSIS — Z08 Encounter for follow-up examination after completed treatment for malignant neoplasm: Secondary | ICD-10-CM

## 2023-12-01 DIAGNOSIS — C50412 Malignant neoplasm of upper-outer quadrant of left female breast: Secondary | ICD-10-CM

## 2023-12-01 MED ORDER — LIDOCAINE 5 % EX OINT: 1.0000 | TOPICAL_OINTMENT | CUTANEOUS | 0 refills | Status: DC | PRN

## 2023-12-01 NOTE — Patient Instructions (Addendum)
 Please call the office if you have any questions or concerns   We will see you back in about 3 months, we placed you in our recall system and will send you out a letter with appointment information once our schedule becomes available   We placed a order for you to see a Nephrologist, they should call you for an appointment.  Lidocaine  was also sent in to your pharmacy

## 2023-12-02 NOTE — Progress Notes (Signed)
 Outpatient Surgical Follow Up    Alexandria Fry is an 68 y.o. female.   Chief Complaint  Patient presents with   Routine Post Op    HPI: s/p left lumpectomy SLNB, micromets in 1 node, negative margins. She is doing ok, developed a large echymosis on surgical site and chest wall  that has improved significantly. Some burning sensation around nipple, no fevers or chills. 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)    Thyroid  nodule     Past Surgical History:  Procedure Laterality Date   ABDOMINAL HYSTERECTOMY     AXILLARY SENTINEL NODE BIOPSY Left 11/11/2023   Procedure: BIOPSY, LYMPH NODE, SENTINEL, AXILLARY;  Surgeon: Jordis Laneta FALCON, MD;  Location: [facility];  Service: General;  Laterality: Left;   BREAST BIOPSY Left 10/28/2023   u/s bx heart clip path pending   BREAST BIOPSY Left 10/28/2023   US  LT BREAST BX W LOC DEV 1ST LESION IMG BX SPEC US  GUIDE 10/28/2023 [facility]   BREAST BIOPSY Left 11/09/2023   US  LT BREAST SAVI/RF TAG 1ST LESION US  GUIDE 11/09/2023 [facility]   BREAST LUMPECTOMY WITH RADIO FREQUENCY LOCALIZER Left 11/11/2023   Procedure: BREAST LUMPECTOMY WITH RADIO FREQUENCY LOCALIZER;  Surgeon: Jordis Laneta FALCON, MD;  Location: [facility];  Service: General;  Laterality: Left;   CERVICAL CONE BIOPSY     COLONOSCOPY WITH PROPOFOL  N/A 01/18/2018   Procedure: COLONOSCOPY WITH PROPOFOL ;  Surgeon: [name], MD;  Location: [facility] ENDOSCOPY;   Service: Endoscopy;  Laterality: N/A;   COLONOSCOPY WITH PROPOFOL  N/A 02/25/2022   Procedure: COLONOSCOPY WITH PROPOFOL ;  Surgeon: [name], MD;  Location: [facility] ENDOSCOPY;  Service: Gastroenterology;  Laterality: N/A;   CYSTOSCOPY  11/11/2015   Procedure: CYSTOSCOPY;  Surgeon: [name], MD;  Location: [facility];  Service: Gynecology;;   DILATATION & CURETTAGE/HYSTEROSCOPY WITH MYOSURE N/A 03/21/2015   Procedure: DILATATION & CURETTAGE/HYSTEROSCOPY, POLYPECTOMY;  Surgeon: [name], MD;  Location: [facility];  Service: Gynecology;  Laterality: N/A;   DILATION AND CURETTAGE OF UTERUS  1981   [facility]    DILATION AND CURETTAGE OF UTERUS  06/25/2009   normal (Dr. [name])   LAPAROSCOPIC BILATERAL SALPINGECTOMY Bilateral 11/11/2015   Procedure: LAPAROSCOPIC BILATERAL SALPINGECTOMY;  Surgeon: [name], MD;  Location: [facility];  Service: Gynecology;  Laterality: Bilateral;   LAPAROSCOPIC SUPRACERVICAL HYSTERECTOMY  11/11/2015   Procedure: LAPAROSCOPIC SUPRACERVICAL HYSTERECTOMY CONVERTED TO OPEN;  Surgeon: [name], MD;  Location: [facility];  Service: Gynecology;;   LAPAROTOMY  11/11/2015   Procedure: LAPAROTOMY;  Surgeon: [name], MD;  Location: [facility];  Service: Gynecology;;   VAGINAL DELIVERY     NSVD x 2    Family History  Problem Relation Age of Onset   Diabetes Mother    Hypertension Mother    Colon cancer Mother 25   Breast cancer Mother 44  Osteoporosis Mother    Pancreatic cancer Father 64   Colon polyps Father    Alcohol abuse Father    Heart disease Father    Early death Sister 3       suicide   Thyroid  disease Neg Hx     Social History:  reports that she quit smoking about 41 years ago. Her smoking use included cigarettes. She started smoking about 51 years ago. She has a 10 pack-year smoking history. She has been exposed to tobacco smoke. She has never used smokeless tobacco. She reports current alcohol use. She reports that she does not use  drugs.  Allergies:  Allergies  Allergen Reactions   Amlodipine  Other (See Comments) and Swelling    edema   Amoxicillin  Anaphylaxis   Azithromycin Rash   Hydrochlorothiazide Other (See Comments)    H/o sulfa allergy   Penicillins Other (See Comments)    Tongue swells    Pravachol  [Pravastatin ] Diarrhea and Nausea And Vomiting    Tongue swelling   Sulfa Antibiotics Other (See Comments)    Tongue swells   Ace Inhibitors Other (See Comments)    Would avoid.  She has h/o lip swelling with other meds.    Albuterol      Pulse elevation.   Angiotensin Receptor Blockers Other (See Comments)    Would avoid.  She has h/o lip swelling with other meds.    Clonidine  Derivatives Other (See Comments)    bradycardia   Diphenhydramine Hcl     Benadryl cream causes local skin irritation.  Would avoid med.  She can tolerate zyrtec.    Fentanyl  Other (See Comments)    She felt diffusely awful while on med   Hydrochlorothiazide-Triamterene     Possible hives and GI upset   Hydrocodone     intolerant   Metformin And Related     rash   Metoprolol      Bradycardia.   Nitrofurantoin Other (See Comments)    rash   Oxycodone Other (See Comments)    intolerant   Tetanus Toxoid-Containing Vaccines     Local swelling, can consider split dosing 2 weeks apart   Cefuroxime Axetil Rash    Rash   Levofloxacin In D5w Itching    Severe itching and flushing.   Tetanus Toxoid, Adsorbed Rash    Medications reviewed.    ROS Full ROS performed and is otherwise negative other than what is stated in HPI   BP (!) 155/83   Pulse (!) 102   Temp 98.5 F (36.9 C) (Oral)   Ht 5' 5 (1.651 m)   Wt 200 lb (90.7 kg)   LMP  (LMP Unknown)   SpO2 98%   BMI 33.28 kg/m   Physical Exam NAD chaperone present INcision healing well, significant improvement of hematoma and is resolving,. Nipplle viable No evidence of infection or lymphedema   Assessment/Plan: Doing well overall w resolving ecchymosis No  evidence of complications F/U w oncology and radiation rx May RTC in a couple of months    Laneta Luna, MD [facility] General Surgeon

## 2023-12-04 NOTE — Progress Notes (Signed)
 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 [facility]) Staging form: Breast, AJCC 8th Edition - Clinical stage from 11/06/2023: Stage IA (cT1b, cN0(f), cM0, G2, ER+, PR+, HER2-) - Signed by Melanee Annah BROCKS, MD on 11/06/2023 Method of lymph node assessment: Core biopsy Histologic grading system: 3 grade system - Pathologic stage from 11/30/2023: Stage IA (pT1c, pN26mi(sn), cM0, G2, ER+, PR+, HER2-, Oncotype DX score: 5) - Signed by Melanee Annah BROCKS, MD on 12/04/2023 Method of lymph node assessment: Sentinel lymph node biopsy Multigene prognostic tests performed: Oncotype DX Recurrence score range: Less than 11 Histologic grading system: 3 grade system    Chief complaint/ Reason for visit- discuss oncotype results and further management  Heme/Onc history: patient Is a 68 year old female who underwent a routine screening mammogram in August 2025 which was followed by diagnostic mammogram and an ultrasound.  This showed 8 x 6 x 6 mm area of concern at the 3 o'clock position 4 cm from the nipple in the left outer breast.  Targeted ultrasound of the left axilla did not show any abnormal axillary lymph nodes.  Patient underwent core biopsy of the breast mass which was consistent with 7 mm grade 2 invasive mammary carcinoma ER 100% positive, PR 100% positive, Ki-67 15% and HER2 -0   Interval history- Discussed the use of AI scribe software for clinical note transcription with the patient, who gave verbal consent to proceed.  [name] is a 68 year old female with breast cancer who presents for discussion  of lumpectomy results.  She experiences soreness at the surgical site, which is exacerbated by a large hematoma post-surgery. The stitches are still in place and none have come off yet.  She has a history of osteopenia, with her worst bone density score being -1.3 in the left hip. She has been engaged in physical therapy since June 2025 for arthritis in both knees and was preparing to start therapy for her shoulder. She is currently taking 4000 IU of vitamin D  daily, which is monitored by her primary care physician during annual check-ups. She is not currently taking calcium supplements.       ECOG PS- 1 Pain scale- 0   Review of systems- Review of Systems  Constitutional:  Negative for chills, fever, malaise/fatigue and weight loss.  HENT:  Negative for congestion, ear discharge and nosebleeds.   Eyes:  Negative for blurred vision.  Respiratory:  Negative for cough, hemoptysis, sputum production, shortness of breath and wheezing.   Cardiovascular:  Negative for chest pain, palpitations, orthopnea and claudication.  Gastrointestinal:  Negative for abdominal pain, blood in stool, constipation, diarrhea, heartburn, melena, nausea and vomiting.  Genitourinary:  Negative for dysuria, flank pain, frequency, hematuria and urgency.  Musculoskeletal:  Negative for back pain, joint pain and myalgias.  Skin:  Negative for rash.  Neurological:  Negative for dizziness, tingling, focal weakness, seizures, weakness and headaches.  Endo/Heme/Allergies:  Does not bruise/bleed easily.  Psychiatric/Behavioral:  Negative for depression and suicidal ideas. The patient does not have insomnia.       Allergies  Allergen Reactions   Amlodipine  Other (See Comments)  and Swelling    edema   Amoxicillin  Anaphylaxis   Azithromycin Rash   Hydrochlorothiazide Other (See Comments)    H/o sulfa allergy   Penicillins Other (See Comments)    Tongue swells    Pravachol  [Pravastatin ] Diarrhea and Nausea And  Vomiting    Tongue swelling   Sulfa Antibiotics Other (See Comments)    Tongue swells   Ace Inhibitors Other (See Comments)    Would avoid.  She has h/o lip swelling with other meds.    Albuterol      Pulse elevation.   Angiotensin Receptor Blockers Other (See Comments)    Would avoid.  She has h/o lip swelling with other meds.    Clonidine  Derivatives Other (See Comments)    bradycardia   Diphenhydramine Hcl     Benadryl cream causes local skin irritation.  Would avoid med.  She can tolerate zyrtec.    Fentanyl  Other (See Comments)    She felt diffusely awful while on med   Hydrochlorothiazide-Triamterene     Possible hives and GI upset   Hydrocodone     intolerant   Metformin And Related     rash   Metoprolol      Bradycardia.   Nitrofurantoin Other (See Comments)    rash   Oxycodone Other (See Comments)    intolerant   Tetanus Toxoid-Containing Vaccines     Local swelling, can consider split dosing 2 weeks apart   Cefuroxime Axetil Rash    Rash   Levofloxacin In D5w Itching    Severe itching and flushing.   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)    Thyroid  nodule      Past Surgical History:  Procedure Laterality Date   ABDOMINAL HYSTERECTOMY     AXILLARY SENTINEL NODE BIOPSY Left 11/11/2023   Procedure: BIOPSY, LYMPH NODE, SENTINEL, AXILLARY;  Surgeon: Jordis Laneta FALCON, MD;  Location: [facility];  Service: General;  Laterality: Left;   BREAST  BIOPSY Left 10/28/2023   u/s bx heart clip path pending   BREAST BIOPSY Left 10/28/2023   US  LT BREAST BX W LOC DEV 1ST LESION IMG BX SPEC US  GUIDE 10/28/2023 [facility]   BREAST BIOPSY Left 11/09/2023   US  LT BREAST SAVI/RF TAG 1ST LESION US  GUIDE 11/09/2023 [facility]   BREAST LUMPECTOMY WITH RADIO FREQUENCY LOCALIZER Left 11/11/2023   Procedure: BREAST LUMPECTOMY WITH RADIO FREQUENCY LOCALIZER;  Surgeon: Jordis Laneta FALCON, MD;  Location: [facility];  Service: General;  Laterality: Left;   CERVICAL CONE BIOPSY     COLONOSCOPY WITH PROPOFOL  N/A 01/18/2018   Procedure: COLONOSCOPY WITH PROPOFOL ;  Surgeon: [name], MD;  Location: [facility];  Service: Endoscopy;  Laterality: N/A;   COLONOSCOPY WITH PROPOFOL  N/A 02/25/2022   Procedure: COLONOSCOPY WITH PROPOFOL ;  Surgeon: [name], MD;  Location: [facility] ENDOSCOPY;  Service: Gastroenterology;  Laterality: N/A;   CYSTOSCOPY  11/11/2015   Procedure: CYSTOSCOPY;  Surgeon: [name], MD;  Location: [facility];  Service: Gynecology;;   DILATATION & CURETTAGE/HYSTEROSCOPY WITH MYOSURE N/A 03/21/2015   Procedure: DILATATION & CURETTAGE/HYSTEROSCOPY, POLYPECTOMY;  Surgeon: [name], MD;  Location: [facility];  Service: Gynecology;  Laterality: N/A;   DILATION AND CURETTAGE OF UTERUS  1981   [facility] BX    DILATION AND CURETTAGE OF UTERUS  06/25/2009   normal (Dr. [name])   LAPAROSCOPIC BILATERAL SALPINGECTOMY Bilateral 11/11/2015   Procedure: LAPAROSCOPIC BILATERAL SALPINGECTOMY;  Surgeon: [name], MD;  Location: [facility];  Service: Gynecology;  Laterality: Bilateral;   LAPAROSCOPIC SUPRACERVICAL HYSTERECTOMY  11/11/2015   Procedure: LAPAROSCOPIC SUPRACERVICAL HYSTERECTOMY CONVERTED TO OPEN;  Surgeon: [name], MD;  Location: [facility];  Service: Gynecology;;   LAPAROTOMY  11/11/2015   Procedure: LAPAROTOMY;  Surgeon: [name], MD;  Location: [facility];  Service: Gynecology;;   VAGINAL DELIVERY     NSVD x 2     Social History   Socioeconomic History   Marital status: Married    Spouse name: Not on file   Number of children: 2   Years of education: Not on file   Highest education level: Associate degree: academic program  Occupational History    Employer: [employer]    Comment: Buyer  Tobacco Use   Smoking status: Former    Current packs/day: 0.00    Average packs/day: 1 pack/day for 10.0 years (10.0 ttl pk-yrs)    Types: Cigarettes    Start date: 01/21/1972    Quit date: 01/20/1982    Years since quitting: 41.8    Passive exposure: Past   Smokeless tobacco: Never   Tobacco comments:    quit for 28 years  Vaping Use   Vaping status: Never Used  Substance and Sexual Activity   Alcohol use: Yes    Comment: ~6 drinks per week.   Drug use: No   Sexual activity: Yes  Other Topics Concern   Not on file  Social History Narrative   Lives with husband, 76   1 son at home   1 son out of the house   Social Drivers of Health   Financial Resource Strain: Low Risk  (08/12/2023)   Overall Financial Resource Strain (CARDIA)    Difficulty of Paying Living Expenses: Not hard at all  Food Insecurity: No Food Insecurity (11/05/2023)   Hunger Vital Sign    Worried About Running Out of Food in the Last Year: Never true    Ran Out of Food in the Last Year: Never true  Transportation Needs: No Transportation Needs (11/05/2023)   PRAPARE - Administrator, Civil Service (Medical): No    Lack of Transportation (Non-Medical): No  Physical Activity: Insufficiently Active (08/12/2023)   Exercise Vital Sign    Days of Exercise per Week: 3 days    Minutes of Exercise per Session: 40 min  Stress: No Stress Concern Present (08/12/2023)   [employer] of Occupational Health - Occupational Stress Questionnaire    Feeling of Stress: Only a little  Social Connections: Socially Isolated (08/12/2023)   Social Connection and Isolation Panel    Frequency of Communication with Friends  and Family: Once a week    Frequency of Social Gatherings with Friends and Family: Once a week    Attends Religious Services: Never    Database administrator or Organizations: No    Attends Banker Meetings: Not on  file    Marital Status: Married  Catering manager Violence: Not At Risk (11/05/2023)   Humiliation, Afraid, Rape, and Kick questionnaire    Fear of Current or Ex-Partner: No    Emotionally Abused: No    Physically Abused: No    Sexually Abused: No    Family History  Problem Relation Age of Onset   Diabetes Mother    Hypertension Mother    Colon cancer Mother 65   Breast cancer Mother 23   Osteoporosis Mother    Pancreatic cancer Father 67   Colon polyps Father    Alcohol abuse Father    Heart disease Father    Early death Sister 77       suicide   Thyroid  disease Neg Hx      Current Outpatient Medications:    Cyanocobalamin (B-12) 3000 MCG CAPS, Take 1 tablet by mouth daily., Disp: , Rfl:    empagliflozin  (JARDIANCE ) 10 MG TABS tablet, Take 1 tablet (10 mg total) by mouth every other day., Disp: , Rfl:    hydrocortisone  (PROCTOZONE -HC) 2.5 % rectal cream, Place 1 Application rectally 2 (two) times daily as needed for hemorrhoids or anal itching., Disp: 30 g, Rfl: 0   Multiple Vitamin (MULTIVITAMIN) tablet, Take 1 tablet by mouth daily., Disp: , Rfl:    spironolactone  (ALDACTONE ) 50 MG tablet, TAKE 1 TABLET(50 MG) BY MOUTH DAILY, Disp: 90 tablet, Rfl: 3   triamcinolone  cream (KENALOG ) 0.5 %, Apply 1 Application topically 2 (two) times daily as needed., Disp: 454 g, Rfl: 0   VITAMIN D , CHOLECALCIFEROL, PO, Take 4,000 Units by mouth., Disp: , Rfl:    letrozole (FEMARA) 2.5 MG tablet, TAKE 1 TABLET(2.5 MG) BY MOUTH DAILY, Disp: 90 tablet, Rfl: 0   lidocaine  (XYLOCAINE ) 5 % ointment, Apply 1 Application topically as needed., Disp: 35.44 g, Rfl: 0  Physical exam:  Vitals:   11/30/23 1334 11/30/23 1338  BP: (!) 167/85 (!) 167/91  Pulse: 92   Resp: 16    Temp: 99.1 F (37.3 C)   TempSrc: Tympanic   SpO2: 96%   Weight: 203 lb (92.1 kg)    Physical Exam Cardiovascular:     Rate and Rhythm: Normal rate and regular rhythm.     Heart sounds: Normal heart sounds.  Pulmonary:     Effort: Pulmonary effort is normal.     Breath sounds: Normal breath sounds.  Skin:    General: Skin is warm and dry.  Neurological:     Mental Status: She is alert and oriented to person, place, and time.      I have personally reviewed labs listed below:    Latest Ref Rng & Units 11/09/2023    2:55 PM  CMP  Glucose 70 - 99 mg/dL 855   BUN 8 - 23 mg/dL 29   Creatinine 9.55 - 1.00 mg/dL 8.36   Sodium 864 - 854 mmol/L 135   Potassium 3.5 - 5.1 mmol/L 3.9   Chloride 98 - 111 mmol/L 103   CO2 22 - 32 mmol/L 23   Calcium 8.9 - 10.3 mg/dL 89.8       Latest Ref Rng & Units 11/09/2023    2:55 PM  CBC  WBC 4.0 - 10.5 K/uL 10.1   Hemoglobin 12.0 - 15.0 g/dL 83.1   Hematocrit 63.9 - 46.0 % 50.4   Platelets 150 - 400 K/uL 231    I have personally reviewed Radiology images listed below: No images are attached to the encounter.  MM Breast Surgical Specimen Result Date: 11/11/2023 CLINICAL DATA:  Status post [facility] localized LEFT breast lumpectomy. EXAM: SPECIMEN RADIOGRAPH OF THE LEFT BREAST COMPARISON:  Previous exam(s). FINDINGS: Status post excision of the LEFT breast. The [facility] reflector and heart shaped clip are present within the specimen. IMPRESSION: Specimen radiograph of the LEFT breast. These results were called by telephone at the time of interpretation on 11/11/2023 at 12:13 pm to provider DIEGO PABON , who verbally acknowledged these results. Electronically Signed   By: Corean Salter M.D.   On: 11/11/2023 12:13   NM Sentinel Node Inj-No Rpt (Breast) Result Date: 11/11/2023 Lymphoseek was injected by the Nuclear Medicine Technologist for sentinel lymph node localization.   MM 3D DIAGNOSTIC MAMMOGRAM UNILATERAL LEFT BREAST Result  Date: 11/09/2023 CLINICAL DATA:  Status post SAVI scout localization EXAM: DIGITAL DIAGNOSTIC UNILATERAL LEFT MAMMOGRAM WITH TOMOSYNTHESIS AND CAD TECHNIQUE: Left digital diagnostic mammography and breast tomosynthesis was performed. The images were evaluated with computer-aided detection. COMPARISON:  Previous exam(s). ACR Breast Density Category b: There are scattered areas of fibroglandular density. FINDINGS: LEFT breast diagnostic mammogram status post ultrasound-guided SAVI scout placement demonstrates appropriate position of a SAVI scout reflector adjacent to the heart clip at the site of biopsy-proven IDC in the LEFT breast at 3 o'clock. No new suspicious findings in the LEFT breast. IMPRESSION: Expected appearance of the LEFT breast status post ultrasound-guided needle localization of biopsy-proven IDC. Images were marked for Dr. Jordis. RECOMMENDATION: Treatment plan per surgery/oncology teams. I have discussed the findings and recommendations with the patient. If applicable, a reminder letter will be sent to the patient regarding the next appointment. BI-RADS CATEGORY  6: Known biopsy-proven malignancy. Electronically Signed   By: Norleen Croak M.D.   On: 11/09/2023 14:51   US  LT BREAST SAVI/RF TAG 1ST LESION US  GUIDE Result Date: 11/09/2023 CLINICAL DATA:  LEFT breast cancer.  Pre lumpectomy localization. EXAM: NEEDLE LOCALIZATION OF THE LEFT BREAST WITH ULTRASOUND GUIDANCE COMPARISON:  Previous exam(s). FINDINGS: Patient presents for needle localization prior to LEFT breast lumpectomy for biopsy-proven invasive ductal carcinoma. I met with the patient and we discussed the procedure of needle localization including benefits and alternatives. We discussed the high likelihood of a successful procedure. We discussed the risks of the procedure, including infection, bleeding, tissue injury, and further surgery. Informed, written consent was given. The usual time-out protocol was performed immediately prior to  the procedure. Using ultrasound guidance, sterile technique, 1% lidocaine  and a 10 cm SAVI SCOUT needle, the LEFT breast mass at 3 o'clock 4 cm from the nipple was localized using a radial approach. The images were marked for Dr.  Jordis. IMPRESSION: Radar reflector localization of the LEFT breast. No apparent complications. Electronically Signed   By: Norleen Croak M.D.   On: 11/09/2023 14:49     Assessment and plan- Patient is a 68 y.o. female with history of pathological prognostic stage I invasive mammary carcinoma of the left breast status postlumpectomy and sentinel lymph node biopsy here to discuss further management     Stage I left breast cancer, hormone receptor positive, HER2 negative, post-lumpectomy 1.5 cm grade 2 tumor with micrometastatic disease in one lymph node (N1MI). Strong hormone receptor positivity, HER2 negative. Oncotype DX score of 5 indicates low recurrence risk. Chemotherapy not indicated. - Refer to Dr. Lenn for post-lumpectomy radiation therapy, Monday to Friday for about five days. - Prescribe letrozole post-radiation to reduce recurrence risk. - Discussed aromatase inhibitor side effects: joint pain, hot flashes, fatigue,  mood swings, vaginal dryness, UTI risk, elevated cholesterol. Noted side effects are reversible and alternatives exist. - Advise calcium and vitamin D  supplementation for bone health. - see me 3.5 months from now without labs  Osteopenia T-score of -1.3 in left hip. Concern due to aromatase inhibitor use, which may worsen bone density loss. - Recommend calcium 1200 mg daily. - Continue vitamin D  4000 IU daily, monitored by primary care.       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) Staging form: Breast, AJCC 8th Edition - Clinical stage from 11/06/2023: Stage IA (cT1b, cN0(f), cM0, G2, ER+, PR+, HER2-) - Signed by Melanee Annah BROCKS, MD on 11/06/2023 Method of lymph node assessment: Core biopsy Histologic  grading system: 3 grade system - Pathologic stage from 11/30/2023: Stage IA (pT1c, pN30mi(sn), cM0, G2, ER+, PR+, HER2-, Oncotype DX score: 5) - Signed by Melanee Annah BROCKS, MD on 12/04/2023 Method of lymph node assessment: Sentinel lymph node biopsy Multigene prognostic tests performed: Oncotype DX Recurrence score range: Less than 11 Histologic grading system: 3 grade system     Visit Diagnosis 1. Invasive ductal carcinoma of breast, female, left (HCC)   2. Goals of care, counseling/discussion      Dr. Annah Melanee, MD, MPH [facility] at [facility] [phone] 12/04/2023 1:43 PM

## 2023-12-09 ENCOUNTER — Ambulatory Visit: Payer: Medicare (Managed Care) | Admitting: Family Medicine

## 2023-12-09 ENCOUNTER — Encounter: Payer: Self-pay | Admitting: Family Medicine

## 2023-12-09 VITALS — BP 150/90 | HR 93 | Temp 98.8°F | Ht 65.75 in | Wt 205.1 lb

## 2023-12-09 DIAGNOSIS — R7989 Other specified abnormal findings of blood chemistry: Secondary | ICD-10-CM

## 2023-12-09 LAB — POC URINALSYSI DIPSTICK (AUTOMATED)
Bilirubin, UA: NEGATIVE
Blood, UA: NEGATIVE
Glucose, UA: POSITIVE — AB
Ketones, UA: NEGATIVE
Leukocytes, UA: NEGATIVE
Nitrite, UA: NEGATIVE
Protein, UA: NEGATIVE
Spec Grav, UA: 1.015 (ref 1.010–1.025)
Urobilinogen, UA: 0.2 U/dL
pH, UA: 6 (ref 5.0–8.0)

## 2023-12-09 LAB — BASIC METABOLIC PANEL WITH GFR
BUN: 19 mg/dL (ref 6–23)
CO2: 27 meq/L (ref 19–32)
Calcium: 10.2 mg/dL (ref 8.4–10.5)
Chloride: 98 meq/L (ref 96–112)
Creatinine, Ser: 1.05 mg/dL (ref 0.40–1.20)
GFR: 54.95 mL/min — ABNORMAL LOW (ref >60.00)
Glucose, Bld: 140 mg/dL — ABNORMAL HIGH (ref 70–99)
Potassium: 4.5 meq/L (ref 3.5–5.1)
Sodium: 136 meq/L (ref 135–145)

## 2023-12-09 NOTE — Progress Notes (Signed)
 F/u re: abnormal Cr.  Most recent Cr 1.63 but prev usually ~1.1.  D/w pt about recheck.  See notes on labs from today.  D/w pt about prev path result.  She has f/u pending.    Taking jardiance  every other day prev but was off for about 1 week.  Restarted after surgery, back on med for weeks now, taking it every other day recently.  Not taking ibuprofen jeronimo.    Meds, vitals, and allergies reviewed.   ROS: Per HPI unless specifically indicated in ROS section   Nad Ncat Neck supple, no LA Rrr Ctab Abdomen soft.  Nontender. Skin well-perfused.

## 2023-12-09 NOTE — Patient Instructions (Signed)
 Go to the lab on the way out.   If you have mychart we'll likely use that to update you.    Take care.  Glad to see you.

## 2023-12-12 ENCOUNTER — Ambulatory Visit: Payer: Self-pay | Admitting: Family Medicine

## 2023-12-12 DIAGNOSIS — R7989 Other specified abnormal findings of blood chemistry: Secondary | ICD-10-CM | POA: Insufficient documentation

## 2023-12-12 NOTE — Assessment & Plan Note (Signed)
 Recheck pending.  See notes on labs.  Continue Jardiance  and spironolactone  for now.

## 2023-12-13 ENCOUNTER — Encounter: Payer: Self-pay | Admitting: Radiation Oncology

## 2023-12-13 ENCOUNTER — Ambulatory Visit
Admission: RE | Admit: 2023-12-13 | Discharge: 2023-12-13 | Disposition: A | Discharge status: Home / Self Care | Payer: Medicare (Managed Care) | Source: Ambulatory Visit | Attending: Radiation Oncology | Admitting: Radiation Oncology

## 2023-12-13 VITALS — BP 158/79 | HR 102 | Temp 98.4°F | Resp 16 | Ht 65.75 in | Wt 204.9 lb

## 2023-12-13 DIAGNOSIS — E119 Type 2 diabetes mellitus without complications: Secondary | ICD-10-CM | POA: Insufficient documentation

## 2023-12-13 DIAGNOSIS — Z90710 Acquired absence of both cervix and uterus: Secondary | ICD-10-CM | POA: Insufficient documentation

## 2023-12-13 DIAGNOSIS — Z800 Family history of malignant neoplasm of digestive organs: Secondary | ICD-10-CM | POA: Insufficient documentation

## 2023-12-13 DIAGNOSIS — K760 Fatty (change of) liver, not elsewhere classified: Secondary | ICD-10-CM | POA: Insufficient documentation

## 2023-12-13 DIAGNOSIS — E041 Nontoxic single thyroid nodule: Secondary | ICD-10-CM | POA: Diagnosis not present

## 2023-12-13 DIAGNOSIS — Z1721 Progesterone receptor positive status: Secondary | ICD-10-CM | POA: Diagnosis not present

## 2023-12-13 DIAGNOSIS — Z87891 Personal history of nicotine dependence: Secondary | ICD-10-CM | POA: Insufficient documentation

## 2023-12-13 DIAGNOSIS — Z79899 Other long term (current) drug therapy: Secondary | ICD-10-CM | POA: Insufficient documentation

## 2023-12-13 DIAGNOSIS — R011 Cardiac murmur, unspecified: Secondary | ICD-10-CM | POA: Diagnosis not present

## 2023-12-13 DIAGNOSIS — Z803 Family history of malignant neoplasm of breast: Secondary | ICD-10-CM | POA: Insufficient documentation

## 2023-12-13 DIAGNOSIS — I10 Essential (primary) hypertension: Secondary | ICD-10-CM | POA: Insufficient documentation

## 2023-12-13 DIAGNOSIS — N920 Excessive and frequent menstruation with regular cycle: Secondary | ICD-10-CM | POA: Diagnosis not present

## 2023-12-13 DIAGNOSIS — Z79811 Long term (current) use of aromatase inhibitors: Secondary | ICD-10-CM | POA: Insufficient documentation

## 2023-12-13 DIAGNOSIS — C50412 Malignant neoplasm of upper-outer quadrant of left female breast: Secondary | ICD-10-CM | POA: Diagnosis not present

## 2023-12-13 DIAGNOSIS — Z1732 Human epidermal growth factor receptor 2 negative status: Secondary | ICD-10-CM | POA: Diagnosis not present

## 2023-12-13 DIAGNOSIS — Z7984 Long term (current) use of oral hypoglycemic drugs: Secondary | ICD-10-CM | POA: Insufficient documentation

## 2023-12-13 DIAGNOSIS — Z170 Estrogen receptor positive status [ER+]: Secondary | ICD-10-CM | POA: Diagnosis not present

## 2023-12-13 DIAGNOSIS — K219 Gastro-esophageal reflux disease without esophagitis: Secondary | ICD-10-CM | POA: Diagnosis not present

## 2023-12-13 DIAGNOSIS — E282 Polycystic ovarian syndrome: Secondary | ICD-10-CM | POA: Diagnosis not present

## 2023-12-13 NOTE — Consult Note (Signed)
 NEW PATIENT EVALUATION  Name: Alexandria Fry  MRN: [id number]  Date:   12/13/2023     DOB: 03-09-1955   This 68 y.o. 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   PREVIOUS INVESTIGATIONS:  Mammogram and ultrasound reviewed Clinical notes reviewed Pathology reports reviewed  HPI: Patient is a 68 year old female who presents with an abnormal mammogram of her left breast.  Screening mammogram detected abnormality in the left outer breast this was confirmed on diagnostic mammogram showing an indeterminate 8 mm mass in the left outer breast.  No suspicious left axillary adenopathy was identified.  She underwent ultrasound-guided biopsy which was positive for invasive mammary carcinoma.  She then underwent a wide local excision for an overall grade two 1.5 cm invasive carcinoma ER/PR positive HER2/neu not overexpressed.  Margins were clear but close at 2 mm.  1 of 3 sentinel lymph nodes had a micrometastatic met measuring 0.3 mm.  Patient had a lowRecurrence risk.  Score on Oncotype DX.  She did have a hematoma after surgery although it has resolved.  She will not have systemic treatment based on her Oncotype DX score.  She otherwise specifically denies breast tenderness cough or bone pain.  She is seen today for radiation oncology opinion.  PLANNED TREATMENT REGIMEN: Left whole breast and peripheral lymphatic radiation  PAST MEDICAL HISTORY:  has a past medical history of Allergic rhinitis, Allergy (Age 67), Arthritis (11/22), Diabetes mellitus  without complication (HCC), Difficult intubation, Fatty liver, GERD (gastroesophageal reflux disease), Hirsutism, Hypertension, Irregular menses, Menorrhagia, Murmur, cardiac, PCOS (polycystic ovarian syndrome), Pneumonia (2014), PONV (postoperative nausea and vomiting), 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), and Thyroid  nodule.    PAST SURGICAL HISTORY:  Past Surgical History:  Procedure Laterality Date   ABDOMINAL HYSTERECTOMY     AXILLARY SENTINEL NODE BIOPSY Left 11/11/2023   Procedure: BIOPSY, LYMPH NODE, SENTINEL, AXILLARY;  Surgeon: Jordis Laneta FALCON, MD;  Location: [facility];  Service: General;  Laterality: Left;   BREAST BIOPSY Left 10/28/2023   u/s bx heart clip path pending   BREAST BIOPSY Left 10/28/2023   US  LT BREAST BX W LOC DEV 1ST LESION IMG BX SPEC US  GUIDE 10/28/2023 [facility]   BREAST BIOPSY Left 11/09/2023   US  LT BREAST SAVI/RF TAG 1ST LESION US  GUIDE 11/09/2023 [facility]   BREAST LUMPECTOMY WITH RADIO FREQUENCY LOCALIZER Left 11/11/2023   Procedure: BREAST LUMPECTOMY WITH RADIO FREQUENCY LOCALIZER;  Surgeon: Jordis Laneta FALCON, MD;  Location: [facility];  Service: General;  Laterality: Left;   CERVICAL CONE BIOPSY     COLONOSCOPY WITH PROPOFOL  N/A 01/18/2018   Procedure: COLONOSCOPY WITH PROPOFOL ;  Surgeon: [name], MD;  Location: [facility];  Service: Endoscopy;  Laterality: N/A;   COLONOSCOPY WITH PROPOFOL  N/A 02/25/2022   Procedure: COLONOSCOPY WITH PROPOFOL ;  Surgeon: [name], MD;  Location: [facility] ENDOSCOPY;  Service: Gastroenterology;  Laterality: N/A;   CYSTOSCOPY  11/11/2015   Procedure: CYSTOSCOPY;  Surgeon: [name], MD;  Location: [facility];  Service: Gynecology;;   DILATATION & CURETTAGE/HYSTEROSCOPY WITH MYOSURE N/A 03/21/2015  Procedure: DILATATION & CURETTAGE/HYSTEROSCOPY, POLYPECTOMY;  Surgeon: [name], MD;  Location: [facility];  Service: Gynecology;  Laterality: N/A;   DILATION AND CURETTAGE OF UTERUS   1981   [facility]    DILATION AND CURETTAGE OF UTERUS  06/25/2009   normal (Dr. [name])   LAPAROSCOPIC BILATERAL SALPINGECTOMY Bilateral 11/11/2015   Procedure: LAPAROSCOPIC BILATERAL SALPINGECTOMY;  Surgeon: [name], MD;  Location: [facility];  Service: Gynecology;  Laterality: Bilateral;   LAPAROSCOPIC SUPRACERVICAL HYSTERECTOMY  11/11/2015   Procedure: LAPAROSCOPIC SUPRACERVICAL HYSTERECTOMY CONVERTED TO OPEN;  Surgeon: [name], MD;  Location: [facility];  Service: Gynecology;;   LAPAROTOMY  11/11/2015   Procedure: LAPAROTOMY;  Surgeon: [name], MD;  Location: [facility];  Service: Gynecology;;   VAGINAL DELIVERY     NSVD x 2    FAMILY HISTORY: family history includes ADD / ADHD in her son and son; Alcohol abuse in her father; Anxiety disorder in her sister; Arthritis in her mother; Breast cancer (age of onset: 31) in her mother; Cancer in her father; Cancer (age of onset: 53) in her mother; Colon cancer (age of onset: 4) in her mother; Colon polyps in her father; Diabetes in her brother and mother; Early death (age of onset: 30) in her sister; Heart disease in her father; Hypertension in her brother and mother; Kidney disease in her brother and mother; Obesity in her brother and mother; Osteoporosis in her mother; Pancreatic cancer (age of onset: 50) in her father.  SOCIAL HISTORY:  reports that she quit smoking about 41 years ago. Her smoking use included cigarettes. She started smoking about 51 years ago. She has a 10 pack-year smoking history. She has been exposed to tobacco smoke. She has never used smokeless tobacco. She reports current alcohol use. She reports that she does not use drugs.  ALLERGIES: Amlodipine ; Amoxicillin ; Azithromycin; Hydrochlorothiazide; Penicillins; Pravachol  [pravastatin ]; Sulfa antibiotics; Ace inhibitors; Albuterol ; Angiotensin receptor blockers; Clonidine  derivatives; Diphenhydramine hcl; Fentanyl ; Hydrochlorothiazide-triamterene; Hydrocodone;  Metformin and related; Metoprolol ; Nitrofurantoin; Oxycodone; Tetanus toxoid-containing vaccines; Cefuroxime axetil; Levofloxacin in d5w; and Tetanus toxoid, adsorbed  MEDICATIONS:  Current Outpatient Medications  Medication Sig Dispense Refill   Cyanocobalamin (B-12) 3000 MCG CAPS Take 1 tablet by mouth daily.     empagliflozin  (JARDIANCE ) 10 MG TABS tablet Take 1 tablet (10 mg total) by mouth every other day.     hydrocortisone  (PROCTOZONE -HC) 2.5 % rectal cream Place 1 Application rectally 2 (two) times daily as needed for hemorrhoids or anal itching. 30 g 0   letrozole (FEMARA) 2.5 MG tablet TAKE 1 TABLET(2.5 MG) BY MOUTH DAILY (Patient not taking: Reported on 12/09/2023) 90 tablet 0   lidocaine  (XYLOCAINE ) 5 % ointment Apply 1 Application topically as needed. 35.44 g 0   Multiple Vitamin (MULTIVITAMIN) tablet Take 1 tablet by mouth daily.     spironolactone  (ALDACTONE ) 50 MG tablet TAKE 1 TABLET(50 MG) BY MOUTH DAILY 90 tablet 3   triamcinolone  cream (KENALOG ) 0.5 % Apply 1 Application topically 2 (two) times daily as needed. 454 g 0   VITAMIN D , CHOLECALCIFEROL, PO Take 4,000 Units by mouth.     No current facility-administered medications for this encounter.    ECOG PERFORMANCE STATUS:  0 - Asymptomatic  REVIEW OF SYSTEMS: Patient denies any weight loss, fatigue, weakness, fever, chills or night sweats. Patient denies any loss of vision, blurred vision. Patient denies any ringing  of the ears or hearing loss. No irregular heartbeat. Patient denies heart murmur or history of fainting.  Patient denies any chest pain or pain radiating to her upper extremities. Patient denies any shortness of breath, difficulty breathing at night, cough or hemoptysis. Patient denies any swelling in the lower legs. Patient denies any nausea vomiting, vomiting of blood, or coffee ground material in the vomitus. Patient denies any stomach pain. Patient states has had normal bowel movements no significant  constipation or diarrhea. Patient denies any dysuria, hematuria or significant nocturia. Patient denies any problems walking, swelling in the joints or loss of balance. Patient denies any skin changes, loss of hair or loss of weight. Patient denies any excessive worrying or anxiety or significant depression. Patient denies any problems with insomnia. Patient denies excessive thirst, polyuria, polydipsia. Patient denies any swollen glands, patient denies easy bruising or easy bleeding. Patient denies any recent infections, allergies or URI. Patient s visual fields have not changed significantly in recent time.   PHYSICAL EXAM: BP (!) 158/79 Comment: Have not taken BP medication  Pulse (!) 102   Temp 98.4 F (36.9 C)   Resp 16   Ht 5' 5.75 (1.67 m)   Wt 204 lb 14.4 oz (92.9 kg)   LMP  (LMP Unknown)   BMI 33.32 kg/m  Patient is status post wide local excision and sentinel node biopsy.  Both incisions are healing well.  No dominant masses noted in either breast.  No axillary or supraclavicular adenopathy is appreciated.  Well-developed well-nourished patient in NAD. HEENT reveals PERLA, EOMI, discs not visualized.  Oral cavity is clear. No oral mucosal lesions are identified. Neck is clear without evidence of cervical or supraclavicular adenopathy. Lungs are clear to A&P. Cardiac examination is essentially unremarkable with regular rate and rhythm without murmur rub or thrill. Abdomen is benign with no organomegaly or masses noted. Motor sensory and DTR levels are equal and symmetric in the upper and lower extremities. Cranial nerves II through XII are grossly intact. Proprioception is intact. No peripheral adenopathy or edema is identified. No motor or sensory levels are noted. Crude visual fields are within normal range.  LABORATORY DATA: Pathology reports reviewed    RADIOLOGY RESULTS: Mammogram and ultrasound reviewed compatible with above-stated findings   IMPRESSION: Stage Ia invasive  mammary carcinoma of the left breast with a microcystic metastatic lymph node involved ER/PR positive low Oncotype Dx in 68 year old female  PLAN: At this time based that the patient will not have systemic treatment I would like to treat her breast and peripheral lymphatics.  Will plan on delivering 50 Gray over 5 weeks boosting her scar another 1000 cGy using photon beam therapy.  Risks and benefits of treatment including skin reaction fatigue alteration of blood counts possible inclusion of superficial lung and extremely small chance of lymphedema of her left upper extremity all were discussed in detail with the patient.  Will also try to do deep inspiration breath-hold to separate her heart from our treatment fields.  I would like another week or so for the patient to heal and I have set up treatment planning for next week.  Patient also will be a candidate for endocrine therapy after completion of radiation.  Patient comprehends my recommendations well.  I would like to take this opportunity to thank you for allowing me to participate in the care of your patient.SABRA Marcey Penton, MD

## 2023-12-16 ENCOUNTER — Telehealth: Payer: Self-pay

## 2023-12-16 NOTE — Telephone Encounter (Signed)
 Outbound call to [employer] and informed them the letter of medical necessity was never received via fax yesterday.  Spoke to [place] who is resending fax; will keep a look out for the form.

## 2023-12-22 ENCOUNTER — Ambulatory Visit
Admission: RE | Admit: 2023-12-22 | Discharge: 2023-12-22 | Disposition: A | Discharge status: Home / Self Care | Payer: Medicare (Managed Care) | Source: Ambulatory Visit | Attending: Radiation Oncology | Admitting: Radiation Oncology

## 2023-12-22 DIAGNOSIS — C50412 Malignant neoplasm of upper-outer quadrant of left female breast: Secondary | ICD-10-CM | POA: Diagnosis not present

## 2023-12-22 DIAGNOSIS — C773 Secondary and unspecified malignant neoplasm of axilla and upper limb lymph nodes: Secondary | ICD-10-CM | POA: Diagnosis not present

## 2023-12-22 DIAGNOSIS — Z170 Estrogen receptor positive status [ER+]: Secondary | ICD-10-CM | POA: Diagnosis not present

## 2023-12-22 DIAGNOSIS — Z510 Encounter for antineoplastic radiation therapy: Secondary | ICD-10-CM | POA: Insufficient documentation

## 2023-12-23 ENCOUNTER — Ambulatory Visit: Payer: Medicare (Managed Care)

## 2023-12-23 VITALS — Ht 65.75 in | Wt 204.0 lb

## 2023-12-23 DIAGNOSIS — Z0000 Encounter for general adult medical examination without abnormal findings: Secondary | ICD-10-CM

## 2023-12-23 NOTE — Patient Instructions (Signed)
 Alexandria Fry,  Thank you for taking the time for your Medicare Wellness Visit. I appreciate your continued commitment to your health goals. Please review the care plan we discussed, and feel free to reach out if I can assist you further.  Please note that Annual Wellness Visits do not include a physical exam. Some assessments may be limited, especially if the visit was conducted virtually. If needed, we may recommend an in-person follow-up with your provider.  Ongoing Care Seeing your primary care provider every 3 to 6 months helps us  monitor your health and provide consistent, personalized care.   Referrals If a referral was made during today's visit and you haven't received any updates within two weeks, please contact the referred provider directly to check on the status.  Recommended Screenings:  Health Maintenance  Topic Date Due   Zoster (Shingles) Vaccine (1 of 2) Never done   Pneumococcal Vaccine for age over 32 (2 of 2 - PCV) 10/19/2013   DTaP/Tdap/Td vaccine (3 - Tdap) 01/17/2020   Eye exam for diabetics  12/31/2022   COVID-19 Vaccine (4 - 2025-26 season) 10/18/2023   Medicare Annual Wellness Visit  12/22/2023   Flu Shot  05/16/2024*   Complete foot exam   02/18/2024   Hemoglobin A1C  04/09/2024   Yearly kidney health urinalysis for diabetes  06/09/2024   Breast Cancer Screening  10/12/2024   Yearly kidney function blood test for diabetes  12/08/2024   Colon Cancer Screening  02/26/2027   DEXA scan (bone density measurement)  Completed   Hepatitis C Screening  Completed   Meningitis B Vaccine  Aged Out  *Topic was postponed. The date shown is not the original due date.       11/13/2023   11:30 AM  Advanced Directives  Does Patient Have a Medical Advance Directive? No  Does patient want to make changes to medical advance directive? No - Patient declined  Would patient like information on creating a medical advance directive? No - Patient declined    Vision: Annual  vision screenings are recommended for early detection of glaucoma, cataracts, and diabetic retinopathy. These exams can also reveal signs of chronic conditions such as diabetes and high blood pressure.  Dental: Annual dental screenings help detect early signs of oral cancer, gum disease, and other conditions linked to overall health, including heart disease and diabetes.

## 2023-12-23 NOTE — Progress Notes (Signed)
 Subjective:   Alexandria Fry is a 68 y.o. female who presents for a Medicare Annual Wellness Visit.  I connected with  Dorna CINDERELLA Molt on 12/23/23 by a audio enabled telemedicine application and verified that I am speaking with the correct person using two identifiers.  Patient Location: Home  Provider Location: Office/Clinic  I discussed the limitations of evaluation and management by telemedicine. The patient expressed understanding and agreed to proceed.  Vital Signs: Because this visit was a virtual/telehealth visit, some criteria may be missing or patient reported. Any vitals not documented were not able to be obtained and vitals that have been documented are patient reported.   Allergies (verified) Amlodipine ; Amoxicillin ; Azithromycin; Hydrochlorothiazide; Penicillins; Pravachol  [pravastatin ]; Sulfa antibiotics; Ace inhibitors; Albuterol ; Angiotensin receptor blockers; Clonidine  derivatives; Diphenhydramine hcl; Fentanyl ; Hydrochlorothiazide-triamterene; Hydrocodone; Metformin and related; Metoprolol ; Nitrofurantoin; Oxycodone; Tetanus toxoid-containing vaccines; Cefuroxime axetil; Levofloxacin in d5w; and Tetanus toxoid, adsorbed   History: Past Medical History:  Diagnosis Date   Allergic rhinitis    Allergy Age 43   Borax detergent...hives   Arthritis 11/22   Diabetes mellitus without complication (HCC)    Difficult intubation    a.) limited cervical ROM; b.) anterior anatomical airway   Fatty liver    GERD (gastroesophageal reflux disease)    Hirsutism    Hypertension    Irregular menses    with neg endometrial biopsy 2013   Menorrhagia    Murmur, cardiac    PCOS (polycystic ovarian syndrome)    Pneumonia 2014   PONV (postoperative nausea and vomiting)    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)    Thyroid  nodule    Past Surgical History:  Procedure Laterality Date   ABDOMINAL HYSTERECTOMY     AXILLARY SENTINEL NODE BIOPSY Left 11/11/2023    Procedure: BIOPSY, LYMPH NODE, SENTINEL, AXILLARY;  Surgeon: Jordis Laneta FALCON, MD;  Location: [facility];  Service: General;  Laterality: Left;   BREAST BIOPSY Left 10/28/2023   u/s bx heart clip path pending   BREAST BIOPSY Left 10/28/2023   US  LT BREAST BX W LOC DEV 1ST LESION IMG BX SPEC US  GUIDE 10/28/2023 [facility]   BREAST BIOPSY Left 11/09/2023   US  LT BREAST SAVI/RF TAG 1ST LESION US  GUIDE 11/09/2023 [facility]   BREAST LUMPECTOMY WITH RADIO FREQUENCY LOCALIZER Left 11/11/2023   Procedure: BREAST LUMPECTOMY WITH RADIO FREQUENCY LOCALIZER;  Surgeon: Jordis Laneta FALCON, MD;  Location: [facility];  Service: General;  Laterality: Left;   CERVICAL CONE BIOPSY     COLONOSCOPY WITH PROPOFOL  N/A 01/18/2018   Procedure: COLONOSCOPY WITH PROPOFOL ;  Surgeon: [name], MD;  Location: [facility];  Service: Endoscopy;  Laterality: N/A;   COLONOSCOPY WITH PROPOFOL  N/A 02/25/2022   Procedure: COLONOSCOPY WITH PROPOFOL ;  Surgeon: [name], MD;  Location: [facility];  Service: Gastroenterology;  Laterality: N/A;   CYSTOSCOPY  11/11/2015   Procedure: CYSTOSCOPY;  Surgeon: [name], MD;  Location: [facility];  Service: Gynecology;;   DILATATION & CURETTAGE/HYSTEROSCOPY WITH MYOSURE N/A 03/21/2015   Procedure: DILATATION & CURETTAGE/HYSTEROSCOPY, POLYPECTOMY;  Surgeon: [name], MD;  Location: [facility];  Service: Gynecology;  Laterality: N/A;   DILATION AND CURETTAGE OF UTERUS  1981   [facility]    DILATION AND CURETTAGE OF UTERUS  06/25/2009   normal (Dr. [name])   LAPAROSCOPIC BILATERAL SALPINGECTOMY Bilateral 11/11/2015   Procedure: LAPAROSCOPIC BILATERAL SALPINGECTOMY;  Surgeon: [name], MD;  Location: [facility];  Service: Gynecology;  Laterality: Bilateral;  LAPAROSCOPIC SUPRACERVICAL HYSTERECTOMY  11/11/2015   Procedure: LAPAROSCOPIC SUPRACERVICAL HYSTERECTOMY CONVERTED TO OPEN;  Surgeon: [name], MD;  Location: [facility];  Service: Gynecology;;    LAPAROTOMY  11/11/2015   Procedure: LAPAROTOMY;  Surgeon: [name], MD;  Location: [facility];  Service: Gynecology;;   VAGINAL DELIVERY     NSVD x 2   Family History  Problem Relation Age of Onset   Diabetes Mother    Hypertension Mother    Colon cancer Mother 83   Breast cancer Mother 1   Osteoporosis Mother    Arthritis Mother    Cancer Mother 28   Kidney disease Mother    Obesity Mother    Pancreatic cancer Father 41   Colon polyps Father    Alcohol abuse Father    Heart disease Father    Cancer Father    Early death Sister 16       suicide   Anxiety disorder Sister    Diabetes Brother    Hypertension Brother    Kidney disease Brother    Obesity Brother    ADD / ADHD Son    ADD / ADHD Son    Thyroid  disease Neg Hx    Social History   Occupational History    Employer: [employer]    Comment: Buyer  Tobacco Use   Smoking status: Former    Current packs/day: 0.00    Average packs/day: 1 pack/day for 10.0 years (10.0 ttl pk-yrs)    Types: Cigarettes    Start date: 01/21/1972    Quit date: 01/20/1982    Years since quitting: 41.9    Passive exposure: Past   Smokeless tobacco: Never   Tobacco comments:    quit for 28 years  Vaping Use   Vaping status: Never Used  Substance and Sexual Activity   Alcohol use: Yes    Comment: ~6 drinks per week.   Drug use: No   Sexual activity: Yes   Tobacco Counseling Counseling given: Not Answered Tobacco comments: quit for 28 years  SDOH Screenings   Food Insecurity: No Food Insecurity (12/23/2023)  Housing: Unknown (12/23/2023)  Transportation Needs: No Transportation Needs (12/23/2023)  Utilities: Not At Risk (12/23/2023)  Alcohol Screen: Low Risk  (12/05/2023)  Depression (PHQ2-9): Low Risk  (12/23/2023)  Recent Concern: Depression (PHQ2-9) - Medium Risk (12/09/2023)  Financial Resource Strain: Low Risk  (12/05/2023)  Physical Activity: Inactive (12/23/2023)  Social Connections: Moderately Isolated  (12/23/2023)  Stress: Stress Concern Present (12/23/2023)  Tobacco Use: Medium Risk (12/23/2023)  Health Literacy: Adequate Health Literacy (12/23/2023)   Depression Screen    12/23/2023   10:31 AM 12/13/2023   10:07 AM 12/09/2023    9:46 AM 11/30/2023    1:34 PM 11/05/2023    2:51 PM 11/05/2023    2:42 PM 10/08/2023   11:35 AM  PHQ 2/9 Scores  PHQ - 2 Score 0 0 2 0 0 0 0  PHQ- 9 Score  0 8    4     Goals Addressed               This Visit's Progress     I would like to get back into PT for shoulder        Just trying to get through my treatments        COMPLETED: Patient Stated (pt-stated)        I want to lose some more weight:by exercising and limits carbs and sugar in general  Visit info / Clinical Intake: Medicare Wellness Visit Type:: Subsequent Annual Wellness Visit Medicare Wellness Visit Mode:: Telephone If telephone:: video declined If telephone or video:: unable to obtan vitals due to lack of equipment Interpreter Needed?: No Pre-visit prep was completed: yes AWV questionnaire completed by patient prior to visit?: no Living arrangements:: lives with spouse/significant other Patient's Overall Health Status Rating: good Typical amount of pain: none Does pain affect daily life?: no Are you currently prescribed opioids?: no  Dietary Habits and Nutritional Risks How many meals a day?: 3 Eats fruit and vegetables daily?: yes Most meals are obtained by: preparing own meals In the last 2 weeks, have you had any of the following?: -- (none) Diabetic:: (!) yes Any non-healing wounds?: no How often do you check your BS?: as needed Would you like to be referred to a Nutritionist or for Diabetic Management? : no  Functional Status Activities of Daily Living (to include ambulation/medication): Independent Ambulation: Independent Medication Administration: Independent Home Management: Independent Manage your own finances?: yes Primary transportation is:  driving Concerns about vision?: (!) yes (fuchs dystrophy;affects night driving: does not drive at night if can avoid) Concerns about hearing?: no  Fall Screening Falls in the past year?: 0 Number of falls in past year: 0 Was there an injury with Fall?: 0 Fall Risk Category Calculator: 0 Patient Fall Risk Level: Low Fall Risk  Fall Risk Patient at Risk for Falls Due to: No Fall Risks Fall risk Follow up: Education provided; Falls prevention discussed  Home and Transportation Safety: All rugs have non-skid backing?: yes All stairs or steps have railings?: yes Grab bars in the bathtub or shower?: (!) no Have non-skid surface in bathtub or shower?: yes Good home lighting?: yes Regular seat belt use?: yes Hospital stays in the last year:: no  Cognitive Assessment Difficulty concentrating, remembering, or making decisions? : no Will 6CIT or Mini Cog be Completed: yes What year is it?: 0 points What month is it?: 0 points Give patient an address phrase to remember (5 components): [address] [place]  About what time is it?: 0 points Count backwards from 20 to 1: 0 points Say the months of the year in reverse: 0 points Repeat the address phrase from earlier: 0 points 6 CIT Score: 0 points  Advance Directives (For Healthcare) Does Patient Have a Medical Advance Directive?: No Does patient want to make changes to medical advance directive?: No - Patient declined Type of Advance Directive: Healthcare Power of [employer] of Healthcare Power of Attorney in Chart?: No - copy requested Would patient like information on creating a medical advance directive?: No - Patient declined        Objective:    Today's Vitals   12/23/23 1014  Weight: 204 lb (92.5 kg)  Height: 5' 5.75 (1.67 m)   Body mass index is 33.18 kg/m.  Current Medications (verified) Outpatient Encounter Medications as of 12/23/2023  Medication Sig   Cyanocobalamin (B-12) 3000 MCG CAPS Take 1  tablet by mouth daily.   empagliflozin  (JARDIANCE ) 10 MG TABS tablet Take 1 tablet (10 mg total) by mouth every other day.   hydrocortisone  (PROCTOZONE -HC) 2.5 % rectal cream Place 1 Application rectally 2 (two) times daily as needed for hemorrhoids or anal itching.   lidocaine  (XYLOCAINE ) 5 % ointment Apply 1 Application topically as needed.   Multiple Vitamin (MULTIVITAMIN) tablet Take 1 tablet by mouth daily.   spironolactone  (ALDACTONE ) 50 MG tablet TAKE 1 TABLET(50 MG) BY MOUTH DAILY  triamcinolone  cream (KENALOG ) 0.5 % Apply 1 Application topically 2 (two) times daily as needed.   VITAMIN D , CHOLECALCIFEROL, PO Take 4,000 Units by mouth.   letrozole (FEMARA) 2.5 MG tablet TAKE 1 TABLET(2.5 MG) BY MOUTH DAILY (Patient not taking: Reported on 12/23/2023)   No facility-administered encounter medications on file as of 12/23/2023.   Hearing/Vision screen Vision Screening - Comments:: UTD w/Dr. Portia Immunizations and Health Maintenance Health Maintenance  Topic Date Due   Zoster Vaccines- Shingrix (1 of 2) Never done   Pneumococcal Vaccine: 50+ Years (2 of 2 - PCV) 10/19/2013   DTaP/Tdap/Td (3 - Tdap) 01/17/2020   OPHTHALMOLOGY EXAM  12/31/2022   COVID-19 Vaccine (4 - 2025-26 season) 10/18/2023   Medicare Annual Wellness (AWV)  12/22/2023   Influenza Vaccine  05/16/2024 (Originally 09/17/2023)   FOOT EXAM  02/18/2024   HEMOGLOBIN A1C  04/09/2024   Diabetic kidney evaluation - Urine ACR  06/09/2024   Mammogram  10/12/2024   Diabetic kidney evaluation - eGFR measurement  12/08/2024   Colonoscopy  02/26/2027   DEXA SCAN  Completed   Hepatitis C Screening  Completed   Meningococcal B Vaccine  Aged Out        Assessment/Plan:  This is a routine wellness examination for [place].  Patient Care Team: [name], MD as PCP - General (Family Medicine) Center, [facility], Shasta POUR, RN as Oncology Nurse Navigator Lenn Aran, MD as Consulting Physician (Radiation  Oncology)  I have personally reviewed and noted the following in the patient's chart:   Medical and social history Use of alcohol, tobacco or illicit drugs  Current medications and supplements including opioid prescriptions. Functional ability and status Nutritional status Physical activity Advanced directives List of other physicians Hospitalizations, surgeries, and ER visits in previous 12 months Vitals Screenings to include cognitive, depression, and falls Referrals and appointments  No orders of the defined types were placed in this encounter.  In addition, I have reviewed and discussed with patient certain preventive protocols, quality metrics, and best practice recommendations. A written personalized care plan for preventive services as well as general preventive health recommendations were provided to patient.   Erminio LITTIE Saris, LPN   [id number]/04/[id number]   No follow-ups on file.  After Visit Summary: (MyChart) Due to this being a telephonic visit, the after visit summary with patients personalized plan was offered to patient via MyChart   Nurse Notes: Pt has no concerns or questions. AWV made for one year

## 2023-12-24 ENCOUNTER — Other Ambulatory Visit: Payer: Self-pay | Admitting: *Deleted

## 2023-12-24 ENCOUNTER — Encounter: Payer: Self-pay | Admitting: Family Medicine

## 2023-12-24 DIAGNOSIS — C50412 Malignant neoplasm of upper-outer quadrant of left female breast: Secondary | ICD-10-CM

## 2023-12-24 DIAGNOSIS — Z510 Encounter for antineoplastic radiation therapy: Secondary | ICD-10-CM | POA: Diagnosis not present

## 2023-12-26 ENCOUNTER — Other Ambulatory Visit: Payer: Self-pay | Admitting: Family Medicine

## 2023-12-26 DIAGNOSIS — M545 Low back pain, unspecified: Secondary | ICD-10-CM

## 2023-12-28 ENCOUNTER — Encounter: Payer: Self-pay | Admitting: *Deleted

## 2023-12-28 ENCOUNTER — Ambulatory Visit
Admission: RE | Admit: 2023-12-28 | Discharge: 2023-12-28 | Disposition: A | Discharge status: Home / Self Care | Payer: Medicare (Managed Care) | Source: Ambulatory Visit | Attending: Radiation Oncology | Admitting: Radiation Oncology

## 2023-12-28 DIAGNOSIS — Z510 Encounter for antineoplastic radiation therapy: Secondary | ICD-10-CM | POA: Diagnosis not present

## 2023-12-28 NOTE — Progress Notes (Signed)
 The patient was the participant in this visit

## 2023-12-28 NOTE — Telephone Encounter (Signed)
 Secure chat sent to [facility] to inform of multiple attempts to obtain medical necessity form.

## 2023-12-29 ENCOUNTER — Ambulatory Visit: Payer: Medicare (Managed Care)

## 2023-12-29 ENCOUNTER — Inpatient Hospital Stay: Payer: Medicare (Managed Care)

## 2023-12-30 ENCOUNTER — Ambulatory Visit
Admission: RE | Admit: 2023-12-30 | Discharge: 2023-12-30 | Disposition: A | Discharge status: Home / Self Care | Payer: Medicare (Managed Care) | Source: Ambulatory Visit | Attending: Radiation Oncology | Admitting: Radiation Oncology

## 2023-12-30 ENCOUNTER — Other Ambulatory Visit: Payer: Self-pay

## 2023-12-30 DIAGNOSIS — H524 Presbyopia: Secondary | ICD-10-CM | POA: Diagnosis not present

## 2023-12-30 DIAGNOSIS — Z510 Encounter for antineoplastic radiation therapy: Secondary | ICD-10-CM | POA: Diagnosis not present

## 2023-12-30 DIAGNOSIS — H5213 Myopia, bilateral: Secondary | ICD-10-CM | POA: Diagnosis not present

## 2023-12-30 DIAGNOSIS — E119 Type 2 diabetes mellitus without complications: Secondary | ICD-10-CM | POA: Diagnosis not present

## 2023-12-30 DIAGNOSIS — H52213 Irregular astigmatism, bilateral: Secondary | ICD-10-CM | POA: Diagnosis not present

## 2023-12-30 DIAGNOSIS — H18523 Epithelial (juvenile) corneal dystrophy, bilateral: Secondary | ICD-10-CM | POA: Diagnosis not present

## 2023-12-30 DIAGNOSIS — H1045 Other chronic allergic conjunctivitis: Secondary | ICD-10-CM | POA: Diagnosis not present

## 2023-12-30 DIAGNOSIS — H18513 Endothelial corneal dystrophy, bilateral: Secondary | ICD-10-CM | POA: Diagnosis not present

## 2023-12-30 LAB — RAD ONC ARIA SESSION SUMMARY
Course Elapsed Days: 0
Plan Fractions Treated to Date: 1
Plan Prescribed Dose Per Fraction: 2 Gy
Plan Total Fractions Prescribed: 25
Plan Total Prescribed Dose: 50 Gy
Reference Point Dosage Given to Date: 2 Gy
Reference Point Session Dosage Given: 2 Gy
Session Number: 1

## 2023-12-30 LAB — OPHTHALMOLOGY REPORT-SCANNED

## 2023-12-31 ENCOUNTER — Ambulatory Visit
Admission: RE | Admit: 2023-12-31 | Discharge: 2023-12-31 | Disposition: A | Discharge status: Home / Self Care | Payer: Medicare (Managed Care) | Source: Ambulatory Visit | Attending: Radiation Oncology | Admitting: Radiation Oncology

## 2023-12-31 ENCOUNTER — Other Ambulatory Visit: Payer: Self-pay

## 2023-12-31 DIAGNOSIS — Z510 Encounter for antineoplastic radiation therapy: Secondary | ICD-10-CM | POA: Diagnosis not present

## 2023-12-31 LAB — RAD ONC ARIA SESSION SUMMARY
Course Elapsed Days: 1
Plan Fractions Treated to Date: 2
Plan Prescribed Dose Per Fraction: 2 Gy
Plan Total Fractions Prescribed: 25
Plan Total Prescribed Dose: 50 Gy
Reference Point Dosage Given to Date: 4 Gy
Reference Point Session Dosage Given: 2 Gy
Session Number: 2

## 2024-01-03 ENCOUNTER — Other Ambulatory Visit: Payer: Self-pay

## 2024-01-03 ENCOUNTER — Ambulatory Visit
Admission: RE | Admit: 2024-01-03 | Discharge: 2024-01-03 | Disposition: A | Discharge status: Home / Self Care | Payer: Medicare (Managed Care) | Source: Ambulatory Visit | Attending: Radiation Oncology | Admitting: Radiation Oncology

## 2024-01-03 DIAGNOSIS — Z510 Encounter for antineoplastic radiation therapy: Secondary | ICD-10-CM | POA: Diagnosis not present

## 2024-01-03 LAB — RAD ONC ARIA SESSION SUMMARY
Course Elapsed Days: 4
Plan Fractions Treated to Date: 3
Plan Prescribed Dose Per Fraction: 2 Gy
Plan Total Fractions Prescribed: 25
Plan Total Prescribed Dose: 50 Gy
Reference Point Dosage Given to Date: 6 Gy
Reference Point Session Dosage Given: 2 Gy
Session Number: 3

## 2024-01-04 ENCOUNTER — Inpatient Hospital Stay: Payer: Medicare (Managed Care)

## 2024-01-04 ENCOUNTER — Ambulatory Visit: Payer: Medicare (Managed Care)

## 2024-01-04 NOTE — Progress Notes (Signed)
 Nutrition  Patient called to cancel nutrition appointment for today. Did not reschedule at this time.   RD available if needed  Ruthanna Macchia B. Dasie SOLON, CSO, LDN Registered Dietitian [phone]

## 2024-01-05 ENCOUNTER — Other Ambulatory Visit: Payer: Self-pay

## 2024-01-05 ENCOUNTER — Telehealth: Payer: Self-pay | Admitting: Surgery

## 2024-01-05 ENCOUNTER — Inpatient Hospital Stay: Payer: Medicare (Managed Care) | Attending: Oncology

## 2024-01-05 ENCOUNTER — Ambulatory Visit
Admission: RE | Admit: 2024-01-05 | Discharge: 2024-01-05 | Disposition: A | Discharge status: Home / Self Care | Payer: Medicare (Managed Care) | Source: Ambulatory Visit | Attending: Radiation Oncology | Admitting: Radiation Oncology

## 2024-01-05 DIAGNOSIS — C50412 Malignant neoplasm of upper-outer quadrant of left female breast: Secondary | ICD-10-CM | POA: Insufficient documentation

## 2024-01-05 DIAGNOSIS — Z17411 Hormone receptor positive with human epidermal growth factor receptor 2 negative status: Secondary | ICD-10-CM | POA: Diagnosis not present

## 2024-01-05 DIAGNOSIS — Z510 Encounter for antineoplastic radiation therapy: Secondary | ICD-10-CM | POA: Diagnosis not present

## 2024-01-05 LAB — CBC (CANCER CENTER ONLY)
HCT: 50.6 % — ABNORMAL HIGH (ref 36.0–46.0)
Hemoglobin: 16.5 g/dL — ABNORMAL HIGH (ref 12.0–15.0)
MCH: 32.7 pg (ref 26.0–34.0)
MCHC: 32.6 g/dL (ref 30.0–36.0)
MCV: 100.4 fL — ABNORMAL HIGH (ref 80.0–100.0)
Platelet Count: 229 K/uL (ref 150–400)
RBC: 5.04 MIL/uL (ref 3.87–5.11)
RDW: 11.9 % (ref 11.5–15.5)
WBC Count: 8.9 K/uL (ref 4.0–10.5)
nRBC: 0 % (ref 0.0–0.2)

## 2024-01-05 LAB — RAD ONC ARIA SESSION SUMMARY
Course Elapsed Days: 6
Plan Fractions Treated to Date: 4
Plan Prescribed Dose Per Fraction: 2 Gy
Plan Total Fractions Prescribed: 25
Plan Total Prescribed Dose: 50 Gy
Reference Point Dosage Given to Date: 8 Gy
Reference Point Session Dosage Given: 2 Gy
Session Number: 4

## 2024-01-05 NOTE — Telephone Encounter (Signed)
 Received My Chart message pt asking to be scheduled and requesting certain time frames. I called and left a message asking her to call us  so that I can get the exact time that is best for her and to make sure that I know what the visit is for since it was not specified in the my chart message

## 2024-01-06 ENCOUNTER — Ambulatory Visit
Admission: RE | Admit: 2024-01-06 | Discharge: 2024-01-06 | Disposition: A | Payer: Medicare (Managed Care) | Source: Ambulatory Visit | Attending: Radiation Oncology | Admitting: Radiation Oncology

## 2024-01-06 ENCOUNTER — Other Ambulatory Visit: Payer: Self-pay

## 2024-01-06 DIAGNOSIS — Z510 Encounter for antineoplastic radiation therapy: Secondary | ICD-10-CM | POA: Diagnosis not present

## 2024-01-06 LAB — RAD ONC ARIA SESSION SUMMARY
Course Elapsed Days: 7
Plan Fractions Treated to Date: 5
Plan Prescribed Dose Per Fraction: 2 Gy
Plan Total Fractions Prescribed: 25
Plan Total Prescribed Dose: 50 Gy
Reference Point Dosage Given to Date: 10 Gy
Reference Point Session Dosage Given: 2 Gy
Session Number: 5

## 2024-01-07 ENCOUNTER — Ambulatory Visit
Admission: RE | Admit: 2024-01-07 | Discharge: 2024-01-07 | Disposition: A | Discharge status: Home / Self Care | Payer: Medicare (Managed Care) | Source: Ambulatory Visit | Attending: Radiation Oncology | Admitting: Radiation Oncology

## 2024-01-07 ENCOUNTER — Other Ambulatory Visit: Payer: Self-pay

## 2024-01-07 DIAGNOSIS — Z510 Encounter for antineoplastic radiation therapy: Secondary | ICD-10-CM | POA: Diagnosis not present

## 2024-01-07 LAB — RAD ONC ARIA SESSION SUMMARY
Course Elapsed Days: 8
Plan Fractions Treated to Date: 6
Plan Prescribed Dose Per Fraction: 2 Gy
Plan Total Fractions Prescribed: 25
Plan Total Prescribed Dose: 50 Gy
Reference Point Dosage Given to Date: 12 Gy
Reference Point Session Dosage Given: 2 Gy
Session Number: 6

## 2024-01-10 ENCOUNTER — Ambulatory Visit
Admission: RE | Admit: 2024-01-10 | Discharge: 2024-01-10 | Disposition: A | Discharge status: Home / Self Care | Payer: Medicare (Managed Care) | Source: Ambulatory Visit | Attending: Radiation Oncology | Admitting: Radiation Oncology

## 2024-01-10 ENCOUNTER — Other Ambulatory Visit: Payer: Self-pay

## 2024-01-10 DIAGNOSIS — Z510 Encounter for antineoplastic radiation therapy: Secondary | ICD-10-CM | POA: Diagnosis not present

## 2024-01-10 LAB — RAD ONC ARIA SESSION SUMMARY
Course Elapsed Days: 11
Plan Fractions Treated to Date: 7
Plan Prescribed Dose Per Fraction: 2 Gy
Plan Total Fractions Prescribed: 25
Plan Total Prescribed Dose: 50 Gy
Reference Point Dosage Given to Date: 14 Gy
Reference Point Session Dosage Given: 2 Gy
Session Number: 7

## 2024-01-11 ENCOUNTER — Other Ambulatory Visit: Payer: Self-pay

## 2024-01-11 ENCOUNTER — Ambulatory Visit
Admission: RE | Admit: 2024-01-11 | Discharge: 2024-01-11 | Disposition: A | Payer: Medicare (Managed Care) | Source: Ambulatory Visit | Attending: Radiation Oncology | Admitting: Radiation Oncology

## 2024-01-11 DIAGNOSIS — Z510 Encounter for antineoplastic radiation therapy: Secondary | ICD-10-CM | POA: Diagnosis not present

## 2024-01-11 LAB — RAD ONC ARIA SESSION SUMMARY
Course Elapsed Days: 12
Plan Fractions Treated to Date: 8
Plan Prescribed Dose Per Fraction: 2 Gy
Plan Total Fractions Prescribed: 25
Plan Total Prescribed Dose: 50 Gy
Reference Point Dosage Given to Date: 16 Gy
Reference Point Session Dosage Given: 2 Gy
Session Number: 8

## 2024-01-12 ENCOUNTER — Other Ambulatory Visit: Payer: Self-pay

## 2024-01-12 ENCOUNTER — Ambulatory Visit
Admission: RE | Admit: 2024-01-12 | Discharge: 2024-01-12 | Disposition: A | Discharge status: Home / Self Care | Payer: Medicare (Managed Care) | Source: Ambulatory Visit | Attending: Radiation Oncology | Admitting: Radiation Oncology

## 2024-01-12 DIAGNOSIS — Z510 Encounter for antineoplastic radiation therapy: Secondary | ICD-10-CM | POA: Diagnosis not present

## 2024-01-12 LAB — RAD ONC ARIA SESSION SUMMARY
Course Elapsed Days: 13
Plan Fractions Treated to Date: 9
Plan Prescribed Dose Per Fraction: 2 Gy
Plan Total Fractions Prescribed: 25
Plan Total Prescribed Dose: 50 Gy
Reference Point Dosage Given to Date: 18 Gy
Reference Point Session Dosage Given: 2 Gy
Session Number: 9

## 2024-01-17 ENCOUNTER — Other Ambulatory Visit: Payer: Self-pay

## 2024-01-17 ENCOUNTER — Ambulatory Visit
Admission: RE | Admit: 2024-01-17 | Discharge: 2024-01-17 | Disposition: A | Payer: Medicare (Managed Care) | Source: Ambulatory Visit | Attending: Radiation Oncology | Admitting: Radiation Oncology

## 2024-01-17 DIAGNOSIS — Z170 Estrogen receptor positive status [ER+]: Secondary | ICD-10-CM | POA: Insufficient documentation

## 2024-01-17 DIAGNOSIS — C773 Secondary and unspecified malignant neoplasm of axilla and upper limb lymph nodes: Secondary | ICD-10-CM | POA: Diagnosis not present

## 2024-01-17 DIAGNOSIS — Z510 Encounter for antineoplastic radiation therapy: Secondary | ICD-10-CM | POA: Diagnosis present

## 2024-01-17 DIAGNOSIS — C50412 Malignant neoplasm of upper-outer quadrant of left female breast: Secondary | ICD-10-CM | POA: Diagnosis not present

## 2024-01-17 LAB — RAD ONC ARIA SESSION SUMMARY
Course Elapsed Days: 18
Plan Fractions Treated to Date: 10
Plan Prescribed Dose Per Fraction: 2 Gy
Plan Total Fractions Prescribed: 25
Plan Total Prescribed Dose: 50 Gy
Reference Point Dosage Given to Date: 20 Gy
Reference Point Session Dosage Given: 1.4236 Gy
Session Number: 10

## 2024-01-18 ENCOUNTER — Ambulatory Visit: Payer: Medicare (Managed Care)

## 2024-01-19 ENCOUNTER — Inpatient Hospital Stay: Payer: Medicare (Managed Care) | Attending: Oncology

## 2024-01-19 ENCOUNTER — Other Ambulatory Visit: Payer: Self-pay

## 2024-01-19 ENCOUNTER — Ambulatory Visit
Admission: RE | Admit: 2024-01-19 | Discharge: 2024-01-19 | Disposition: A | Payer: Medicare (Managed Care) | Source: Ambulatory Visit | Attending: Radiation Oncology | Admitting: Radiation Oncology

## 2024-01-19 DIAGNOSIS — Z170 Estrogen receptor positive status [ER+]: Secondary | ICD-10-CM | POA: Diagnosis not present

## 2024-01-19 DIAGNOSIS — C50412 Malignant neoplasm of upper-outer quadrant of left female breast: Secondary | ICD-10-CM | POA: Insufficient documentation

## 2024-01-19 DIAGNOSIS — Z510 Encounter for antineoplastic radiation therapy: Secondary | ICD-10-CM | POA: Diagnosis not present

## 2024-01-19 LAB — RAD ONC ARIA SESSION SUMMARY
Course Elapsed Days: 20
Plan Fractions Treated to Date: 11
Plan Prescribed Dose Per Fraction: 2 Gy
Plan Total Fractions Prescribed: 25
Plan Total Prescribed Dose: 50 Gy
Reference Point Dosage Given to Date: 22 Gy
Reference Point Session Dosage Given: 2 Gy
Session Number: 11

## 2024-01-19 LAB — CBC (CANCER CENTER ONLY)
HCT: 48.9 % — ABNORMAL HIGH (ref 36.0–46.0)
Hemoglobin: 16.4 g/dL — ABNORMAL HIGH (ref 12.0–15.0)
MCH: 33.3 pg (ref 26.0–34.0)
MCHC: 33.5 g/dL (ref 30.0–36.0)
MCV: 99.2 fL (ref 80.0–100.0)
Platelet Count: 227 K/uL (ref 150–400)
RBC: 4.93 MIL/uL (ref 3.87–5.11)
RDW: 11.8 % (ref 11.5–15.5)
WBC Count: 7.9 K/uL (ref 4.0–10.5)
nRBC: 0 % (ref 0.0–0.2)

## 2024-01-20 ENCOUNTER — Ambulatory Visit
Admission: RE | Admit: 2024-01-20 | Discharge: 2024-01-20 | Disposition: A | Payer: Medicare (Managed Care) | Source: Ambulatory Visit | Attending: Radiation Oncology | Admitting: Radiation Oncology

## 2024-01-20 ENCOUNTER — Other Ambulatory Visit: Payer: Self-pay

## 2024-01-20 DIAGNOSIS — Z510 Encounter for antineoplastic radiation therapy: Secondary | ICD-10-CM | POA: Diagnosis not present

## 2024-01-20 LAB — RAD ONC ARIA SESSION SUMMARY
Course Elapsed Days: 21
Plan Fractions Treated to Date: 12
Plan Prescribed Dose Per Fraction: 2 Gy
Plan Total Fractions Prescribed: 25
Plan Total Prescribed Dose: 50 Gy
Reference Point Dosage Given to Date: 24 Gy
Reference Point Session Dosage Given: 2 Gy
Session Number: 12

## 2024-01-21 ENCOUNTER — Other Ambulatory Visit: Payer: Self-pay

## 2024-01-21 ENCOUNTER — Ambulatory Visit
Admission: RE | Admit: 2024-01-21 | Discharge: 2024-01-21 | Disposition: A | Payer: Medicare (Managed Care) | Source: Ambulatory Visit | Attending: Radiation Oncology | Admitting: Radiation Oncology

## 2024-01-21 DIAGNOSIS — Z510 Encounter for antineoplastic radiation therapy: Secondary | ICD-10-CM | POA: Diagnosis not present

## 2024-01-21 LAB — RAD ONC ARIA SESSION SUMMARY
Course Elapsed Days: 22
Plan Fractions Treated to Date: 13
Plan Prescribed Dose Per Fraction: 2 Gy
Plan Total Fractions Prescribed: 25
Plan Total Prescribed Dose: 50 Gy
Reference Point Dosage Given to Date: 26 Gy
Reference Point Session Dosage Given: 2 Gy
Session Number: 13

## 2024-01-24 ENCOUNTER — Ambulatory Visit: Payer: Medicare (Managed Care)

## 2024-01-24 NOTE — Therapy (Signed)
 OUTPATIENT PHYSICAL THERAPY THORACOLUMBAR EVALUATION/TREATMENT   Patient Name: Alexandria Fry MRN: [id number] DOB:11/05/1955, 68 y.o., female Today's Date: 01/25/2024  END OF SESSION:  PT End of Session - 01/25/24 1519     Visit Number 1    Number of Visits 17    Date for Recertification  03/21/24    Progress Note Due on Visit 10    PT Start Time 1520    PT Stop Time 1600    PT Time Calculation (min) 40 min    Activity Tolerance Patient tolerated treatment well    Behavior During Therapy [facility] for tasks assessed/performed          Past Medical History:  Diagnosis Date   Allergic rhinitis    Allergy Age 46   Borax detergent...hives   Arthritis 11/22   Diabetes mellitus without complication (HCC)    Difficult intubation    a.) limited cervical ROM; b.) anterior anatomical airway   Fatty liver    GERD (gastroesophageal reflux disease)    Hirsutism    Hypertension    Irregular menses    with neg endometrial biopsy 2013   Menorrhagia    Murmur, cardiac    PCOS (polycystic ovarian syndrome)    Pneumonia 2014   PONV (postoperative nausea and vomiting)    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)    Thyroid  nodule    Past Surgical History:  Procedure Laterality Date   ABDOMINAL HYSTERECTOMY     AXILLARY SENTINEL NODE BIOPSY Left 11/11/2023   Procedure: BIOPSY, LYMPH NODE, SENTINEL, AXILLARY;  Surgeon: Alexandria Laneta FALCON, MD;  Location: [facility];  Service: General;  Laterality: Left;   BREAST BIOPSY Left 10/28/2023   u/s bx heart clip path pending   BREAST BIOPSY Left 10/28/2023   US  LT BREAST BX W LOC DEV 1ST LESION IMG BX SPEC US  GUIDE 10/28/2023 [facility]   BREAST BIOPSY Left 11/09/2023   US  LT BREAST SAVI/RF TAG 1ST LESION US  GUIDE 11/09/2023 [facility]   BREAST LUMPECTOMY WITH RADIO FREQUENCY LOCALIZER Left 11/11/2023   Procedure: BREAST LUMPECTOMY WITH RADIO FREQUENCY LOCALIZER;  Surgeon: Alexandria Laneta FALCON, MD;  Location: [facility];  Service:  General;  Laterality: Left;   CERVICAL CONE BIOPSY     COLONOSCOPY WITH PROPOFOL  N/A 01/18/2018   Procedure: COLONOSCOPY WITH PROPOFOL ;  Surgeon: Alexandria Gladis PENNER, MD;  Location: [facility] ENDOSCOPY;  Service: Endoscopy;  Laterality: N/A;   COLONOSCOPY WITH PROPOFOL  N/A 02/25/2022   Procedure: COLONOSCOPY WITH PROPOFOL ;  Surgeon: Alexandria Bi, MD;  Location: [facility] ENDOSCOPY;  Service: Gastroenterology;  Laterality: N/A;   CYSTOSCOPY  11/11/2015   Procedure: CYSTOSCOPY;  Surgeon: Alexandria BROCKS Ward, MD;  Location: [facility];  Service: Gynecology;;   DILATATION & CURETTAGE/HYSTEROSCOPY WITH MYOSURE N/A 03/21/2015   Procedure: DILATATION & CURETTAGE/HYSTEROSCOPY, POLYPECTOMY;  Surgeon: Alexandria BROCKS Ward, MD;  Location: [facility];  Service: Gynecology;  Laterality: N/A;   DILATION AND CURETTAGE OF UTERUS  1981   [facility] BX    DILATION AND CURETTAGE OF UTERUS  06/25/2009   normal (Dr. [name])   LAPAROSCOPIC BILATERAL SALPINGECTOMY Bilateral 11/11/2015   Procedure: LAPAROSCOPIC BILATERAL SALPINGECTOMY;  Surgeon: Alexandria BROCKS Ward, MD;  Location: [facility];  Service: Gynecology;  Laterality: Bilateral;   LAPAROSCOPIC SUPRACERVICAL HYSTERECTOMY  11/11/2015   Procedure: LAPAROSCOPIC SUPRACERVICAL HYSTERECTOMY CONVERTED TO OPEN;  Surgeon: Alexandria BROCKS Ward, MD;  Location: [facility];  Service: Gynecology;;   LAPAROTOMY  11/11/2015   Procedure: LAPAROTOMY;  Surgeon: Alexandria BROCKS Ward, MD;  Location: [facility];  Service: Gynecology;;   VAGINAL DELIVERY     NSVD x 2   Patient Active Problem List   Diagnosis Date Noted   Elevated serum creatinine 12/12/2023   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) 11/06/2023   Decreased range of motion of shoulder, left 10/10/2023   Right hip pain 06/13/2023   Healthcare maintenance 02/21/2023   Statin intolerance 02/21/2023   Rash 02/21/2023   Dysuria 02/21/2023   Family history of colorectal cancer 02/25/2022   Adenomatous polyp of colon 02/25/2022   Medicare welcome  exam 12/28/2021   Lung nodule 05/19/2021   Respiratory failure with hypoxia (HCC) 05/06/2021   AKI (acute kidney injury) 05/06/2021   Hyperandrogenemia 05/27/2020   Multinodular goiter 05/27/2020   History of UTI 05/19/2020   Hammer toe of left foot 06/22/2019   Allergy history, drug 04/10/2018   Leg pain 04/10/2018   Status post laparotomy 11/11/2015   Diabetes mellitus without complication (HCC) 10/18/2015   Hyperglycemia 03/09/2015   Advance care planning 02/07/2014   Hyperlipidemia 02/07/2014   Atypical pneumonia 09/30/2012   Vitamin D  deficiency 10/09/2008   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   Overweight 01/18/2007   Essential hypertension 01/18/2007   Allergic rhinitis 11/29/2006   FIBROCYSTIC BREAST DISEASE 11/29/2006    PCP: Alexandria Arlyss RAMAN, MD  REFERRING PROVIDER: [name], MD  REFERRING DIAG:  M54.50 (ICD-10-CM) - Low back pain, unspecified back pain laterality, unspecified chronicity, unspecified whether sciatica present    RATIONALE FOR EVALUATION AND TREATMENT: Rehabilitation  THERAPY DIAG:   ONSET DATE: Chronic   FOLLOW-UP APPT SCHEDULED WITH REFERRING PROVIDER: No    SUBJECTIVE:                                                                                                                                                                                         SUBJECTIVE STATEMENT:    Patient reports to OPPT with acute on chronic lower back pain.   PERTINENT HISTORY:   Alexandria Fry is a 68 y.o female with chief concern of lower back pain. She has had previous bout of physical therapy for same diagnosis but had to take a break due other medical concerns. She is receiving radiation therapy every day for her current diagnosis. She reports that her lower back has worsened due to the position of the radiation chair and recent sedentary lifestyle. She reports that she's always fatigued and contributes it to the cancer treatments. She's also having  intermittent L sided hip pain. She has noticed that she has developed muscle weakness in her BLE.   She denies bowel/bladder changes, saddle paresthesia,  h/o abdominal aneurysm, abdominal pain, chills/fever, night sweats.    PAIN:    Pain Intensity: Present: 2/10, Best: 0/10, Worst: 7/10 Pain location: Lower Back Pain Quality: intermittent, sharp, and stabbing  Radiating: Yes  Numbness/Tingling: No Focal Weakness: No Aggravating factors: Prolonged walking, sitting, standing  Relieving factors: Positional Changes  History of prior back injury, pain, surgery, or therapy: Yes  PRECAUTIONS: Fall  WEIGHT BEARING RESTRICTIONS: No  FALLS: Has patient fallen in last 6 months? No  Living Environment Lives with: lives with their family Lives in: House/apartment Stairs: Yes: Internal: 8 steps; can reach both Has following equipment at home: None  Prior level of function: Independent  Occupational demands: Retired  Hobbies:Reading   Patient Goals: I want to walk longer and do more activities   OBJECTIVE:  Patient Surveys  moDI: TBD   Cognition WNL    Gross Musculoskeletal Assessment Tremor: None Bulk: Normal Tone: Normal No visible step-off along spinal column, no signs of scoliosis  GAIT: Distance walked: 30m  Assistive device utilized: Single point cane Level of assistance: Complete Independence Comments: Reciprocal gait, L hip pain.   Posture: Lumbar lordosis: WNL Iliac crest height: Equal bilaterally Lumbar lateral shift: Negative  AROM AROM (Normal range in degrees) AROM   Lumbar   Flexion (65) 50%  Extension (30) 25%  Right lateral flexion (25) 100%  Left lateral flexion (25) 100%  Right rotation (30)   Left rotation (30)       Hip Right Left  Flexion (125) WNL WNL  Extension (15)    Abduction (40)    Adduction     Internal Rotation (45)    External Rotation (45)        Knee    Flexion (135)    Extension (0)        Ankle    Dorsiflexion  (20)    Plantarflexion (50)    Inversion (35)    Eversion (15)    (* = pain; Blank rows = not tested)  LE MMT: MMT (out of 5) Right  Left   Hip flexion 4- 4-  Hip extension    Hip abduction (Seated) 4 4  Hip adduction 5 5  Hip internal rotation    Hip external rotation    Knee flexion 4- 4-  Knee extension 4- 4-  Ankle dorsiflexion 5 5  Ankle plantarflexion    Ankle inversion    Ankle eversion    (* = pain; Blank rows = not tested)  Sensation Grossly intact to light touch throughout bilateral LEs as determined by testing dermatomes L2-S2. Proprioception, stereognosis, and hot/cold testing deferred on this date.  Reflexes R/L Knee Jerk (L3/4): 2+/2+  Ankle Jerk (S1/2): 2+/2+   Muscle Length Hamstrings: R: Positive L: Positive  Palpation Location Right Left         Lumbar paraspinals 1 1  Quadratus Lumborum    Gluteus Maximus    Gluteus Medius    Deep hip external rotators    PSIS    Fortin's Area (SIJ)    Greater Trochanter    (Blank rows = not tested) Graded on 0-4 scale (0 = no pain, 1 = pain, 2 = pain with wincing/grimacing/flinching, 3 = pain with withdrawal, 4 = unwilling to allow palpation)  Passive Accessory  Motion Deferred   Special Tests Lumbar Radiculopathy and Discogenic:  SLR (SN 92, -LR 0.29): R: Positive L:  Negative Crossed SLR (SP 90): R: Negative L: Negative  Hip: FABER (SN 81): R: Positive  L: Negative FADIR (SN 94): R: Negative L: Negative  TODAY'S TREATMENT: DATE: 01/25/24  Therapeutic Exercise:   Time spent reviewing initial HEP with patient return demo:   Access Code: [email or web address] URL: [email or web address] Date: 01/25/2024 Prepared by: Lonni Enzley Kitchens  Exercises - Supine Posterior Pelvic Tilt  - 2 x daily - 7 x weekly - 3 sets - 10 reps - 5 hold - Supine Active Straight Leg Raise  - 1 x daily - 3-4 x weekly - 3 sets - 10-12 reps - Mini Squat with Counter Support  - 1 x daily - 3-4 x weekly - 3 sets - 10-12 reps      PATIENT EDUCATION:  Education details: HEP, POC, Prognosis Person educated: Patient Education method: Medical Illustrator Education comprehension: verbalized understanding and returned demonstration   HOME EXERCISE PROGRAM:  Access Code: [email or web address] URL: [email or web address] Date: 01/25/2024 Prepared by: Lonni Vianka Ertel  Exercises - Supine Posterior Pelvic Tilt  - 2 x daily - 7 x weekly - 3 sets - 10 reps - 5 hold - Supine Active Straight Leg Raise  - 1 x daily - 3-4 x weekly - 3 sets - 10-12 reps - Mini Squat with Counter Support  - 1 x daily - 3-4 x weekly - 3 sets - 10-12 reps   ASSESSMENT:  CLINICAL IMPRESSION: Patient is a 68 y.o. female who was seen today for physical therapy evaluation and treatment for R hip and lower back pain. Patient presents with decreased LE strength, activity tolerance, and pain with transitions. Concordant pain noted with lumbar extension, R SLR, and TTP along lumbar paraspinals. Pt also presents with decreased hip ROM and increased tightness in deep gluteal muscles when performing FABER. and 5TSTS significant for decreased gait speed and functional strength. + special testing indicative of possible neural tension, however supine hamstring stretch improved symptoms.   Due to repetitive daily bouts of radioactive therapy for CA diagnosis, patient may present with moderate fatigue. Observable atrophy in bilateral quadricep muscle group since previous POC. Patient's impairments limit her from full participation in recreational activities and functional movements such as transfers. Based on her presentation pt will benefit from skilled physical therapy in order to address her current deficits and maximize return to PLOF.   OBJECTIVE IMPAIRMENTS: Abnormal gait, decreased activity tolerance, decreased balance, decreased coordination, decreased endurance, difficulty walking, decreased ROM, decreased strength, impaired flexibility,  postural dysfunction, and pain.   ACTIVITY LIMITATIONS: carrying, lifting, bending, sitting, standing, squatting, sleeping, stairs, and transfers  PARTICIPATION LIMITATIONS: shopping, community activity, and yard work  PERSONAL FACTORS: Age, Past/current experiences, Time since onset of injury/illness/exacerbation, and 1-2 comorbidities: 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), chronic LBP, HTN.  are also affecting patient's functional outcome.   REHAB POTENTIAL: Fair Hx of CA and chronicity of pain  CLINICAL DECISION MAKING: Evolving/moderate complexity  EVALUATION COMPLEXITY: Moderate   GOALS: Goals reviewed with patient? No  SHORT TERM GOALS: Target date: 02/22/2023  Pt will be independent with HEP in order to improve strength and decrease back pain to improve pain-free function at home and work. Baseline: 01/25/2024: Initial HEP Provided Goal status: INITIAL   LONG TERM GOALS: Target date: 03/21/2024   Pt will increase 30s Sit to Stand by at least 6 reps in order to demonstrate significant improvement in lower extremity strength and endurance.   Baseline: 01/25/2024: To Be Tested Goal status: INITIAL  2.  Pt will decrease worst back pain by at least 2 points on the NPRS in  order to demonstrate clinically significant reduction in back pain. Baseline: 01/25/2024:07/10 Goal status: INITIAL  3.  Pt will decrease mODI score by at least 13 points in order demonstrate clinically significant reduction in back pain/disability.       Baseline: 01/25/2024: To Be Scored;  Goal status: INITIAL  4.  Pt will increase by at least 0.13 m/s in order to demonstrate clinically significant improvement in community ambulation.  Baseline: 01/25/2024: 1.00 m/s (9.92s - self selected) Goal status: INITIAL  5. Pt will increase Hip and Knee strength of by at least 1/2 MMT grade in order to demonstrate improvement in strength and function with walking and transfer.   Baseline: 01/25/2024:  MMT (out of 5) Right  Left   Hip flexion 4- 4-  Hip extension    Hip abduction (Seated) 4 4  Hip adduction 5 5  Hip internal rotation    Hip external rotation    Knee flexion 4- 4-  Knee extension 4- 4-   Goal status: INITIAL    PLAN: PT FREQUENCY: 2x/week   PT DURATION: 12 weeks   PLANNED INTERVENTIONS: Therapeutic exercises, Therapeutic activity, Neuromuscular re-education, Balance training, Gait training, Patient/Family education, Self Care, Joint mobilization, Joint manipulation, DME instructions, Dry Needling, Electrical stimulation, Spinal manipulation, Spinal mobilization, Cryotherapy, Moist heat, Manual therapy, and Re-evaluation.   PLAN FOR NEXT SESSION: Progress Hip strengthening, progress core strengthening, functional training (stairs, transfers)   Lonni Pall PT, DPT Physical Therapist- [place]  01/25/2024, 10:00 PM

## 2024-01-25 ENCOUNTER — Ambulatory Visit: Payer: Medicare (Managed Care) | Attending: Family Medicine

## 2024-01-25 ENCOUNTER — Ambulatory Visit
Admission: RE | Admit: 2024-01-25 | Discharge: 2024-01-25 | Disposition: A | Payer: Medicare (Managed Care) | Source: Ambulatory Visit | Attending: Radiation Oncology | Admitting: Radiation Oncology

## 2024-01-25 ENCOUNTER — Other Ambulatory Visit: Payer: Self-pay

## 2024-01-25 DIAGNOSIS — M5416 Radiculopathy, lumbar region: Secondary | ICD-10-CM | POA: Diagnosis not present

## 2024-01-25 DIAGNOSIS — M25612 Stiffness of left shoulder, not elsewhere classified: Secondary | ICD-10-CM | POA: Insufficient documentation

## 2024-01-25 DIAGNOSIS — M5459 Other low back pain: Secondary | ICD-10-CM | POA: Insufficient documentation

## 2024-01-25 DIAGNOSIS — M6281 Muscle weakness (generalized): Secondary | ICD-10-CM | POA: Insufficient documentation

## 2024-01-25 DIAGNOSIS — Z510 Encounter for antineoplastic radiation therapy: Secondary | ICD-10-CM | POA: Diagnosis not present

## 2024-01-25 LAB — RAD ONC ARIA SESSION SUMMARY
Course Elapsed Days: 26
Plan Fractions Treated to Date: 14
Plan Prescribed Dose Per Fraction: 2 Gy
Plan Total Fractions Prescribed: 25
Plan Total Prescribed Dose: 50 Gy
Reference Point Dosage Given to Date: 28 Gy
Reference Point Session Dosage Given: 2 Gy
Session Number: 14

## 2024-01-26 ENCOUNTER — Other Ambulatory Visit: Payer: Self-pay

## 2024-01-26 ENCOUNTER — Ambulatory Visit
Admission: RE | Admit: 2024-01-26 | Discharge: 2024-01-26 | Disposition: A | Discharge status: Home / Self Care | Payer: Medicare (Managed Care) | Source: Ambulatory Visit | Attending: Radiation Oncology | Admitting: Radiation Oncology

## 2024-01-26 DIAGNOSIS — Z510 Encounter for antineoplastic radiation therapy: Secondary | ICD-10-CM | POA: Diagnosis not present

## 2024-01-26 LAB — RAD ONC ARIA SESSION SUMMARY
Course Elapsed Days: 27
Plan Fractions Treated to Date: 15
Plan Prescribed Dose Per Fraction: 2 Gy
Plan Total Fractions Prescribed: 25
Plan Total Prescribed Dose: 50 Gy
Reference Point Dosage Given to Date: 30 Gy
Reference Point Session Dosage Given: 2 Gy
Session Number: 15

## 2024-01-26 NOTE — Progress Notes (Signed)
 Agree.  Thanks.  Arlyss EDISON Cleatus, MD 01/26/24  11:02 AM

## 2024-01-27 ENCOUNTER — Other Ambulatory Visit: Payer: Self-pay

## 2024-01-27 ENCOUNTER — Ambulatory Visit
Admission: RE | Admit: 2024-01-27 | Discharge: 2024-01-27 | Disposition: A | Discharge status: Home / Self Care | Payer: Medicare (Managed Care) | Source: Ambulatory Visit | Attending: Radiation Oncology | Admitting: Radiation Oncology

## 2024-01-27 ENCOUNTER — Telehealth: Payer: Self-pay | Admitting: Family Medicine

## 2024-01-27 ENCOUNTER — Ambulatory Visit: Payer: Medicare (Managed Care)

## 2024-01-27 DIAGNOSIS — M6281 Muscle weakness (generalized): Secondary | ICD-10-CM

## 2024-01-27 DIAGNOSIS — M5416 Radiculopathy, lumbar region: Secondary | ICD-10-CM | POA: Diagnosis not present

## 2024-01-27 DIAGNOSIS — M5459 Other low back pain: Secondary | ICD-10-CM

## 2024-01-27 DIAGNOSIS — Z510 Encounter for antineoplastic radiation therapy: Secondary | ICD-10-CM | POA: Diagnosis not present

## 2024-01-27 LAB — RAD ONC ARIA SESSION SUMMARY
Course Elapsed Days: 28
Plan Fractions Treated to Date: 16
Plan Prescribed Dose Per Fraction: 2 Gy
Plan Total Fractions Prescribed: 25
Plan Total Prescribed Dose: 50 Gy
Reference Point Dosage Given to Date: 32 Gy
Reference Point Session Dosage Given: 2 Gy
Session Number: 16

## 2024-01-27 NOTE — Telephone Encounter (Signed)
 Opened in error

## 2024-01-27 NOTE — Therapy (Signed)
 OUTPATIENT PHYSICAL THERAPY THORACOLUMBAR TREATMENT   Patient Name: Alexandria Fry MRN: [id number] DOB:Nov 05, 1955, 68 y.o., female Today's Date: 01/27/2024  END OF SESSION:  PT End of Session - 01/27/24 1428     Visit Number 2    Number of Visits 17    Date for Recertification  03/21/24    Authorization - Number of Visits 2    Progress Note Due on Visit 10    PT Start Time 1430    PT Stop Time 1510    PT Time Calculation (min) 40 min    Activity Tolerance Patient tolerated treatment well    Behavior During Therapy [facility] for tasks assessed/performed           Past Medical History:  Diagnosis Date   Allergic rhinitis    Allergy Age 59   Borax detergent...hives   Arthritis 11/22   Diabetes mellitus without complication (HCC)    Difficult intubation    a.) limited cervical ROM; b.) anterior anatomical airway   Fatty liver    GERD (gastroesophageal reflux disease)    Hirsutism    Hypertension    Irregular menses    with neg endometrial biopsy 2013   Menorrhagia    Murmur, cardiac    PCOS (polycystic ovarian syndrome)    Pneumonia 2014   PONV (postoperative nausea and vomiting)    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)    Thyroid  nodule    Past Surgical History:  Procedure Laterality Date   ABDOMINAL HYSTERECTOMY     AXILLARY SENTINEL NODE BIOPSY Left 11/11/2023   Procedure: BIOPSY, LYMPH NODE, SENTINEL, AXILLARY;  Surgeon: Jordis Laneta FALCON, MD;  Location: [facility];  Service: General;  Laterality: Left;   BREAST BIOPSY Left 10/28/2023   u/s bx heart clip path pending   BREAST BIOPSY Left 10/28/2023   US  LT BREAST BX W LOC DEV 1ST LESION IMG BX SPEC US  GUIDE 10/28/2023 [facility]   BREAST BIOPSY Left 11/09/2023   US  LT BREAST SAVI/RF TAG 1ST LESION US  GUIDE 11/09/2023 [facility]   BREAST LUMPECTOMY WITH RADIO FREQUENCY LOCALIZER Left 11/11/2023   Procedure: BREAST LUMPECTOMY WITH RADIO FREQUENCY LOCALIZER;  Surgeon: Jordis Laneta FALCON, MD;   Location: [facility];  Service: General;  Laterality: Left;   CERVICAL CONE BIOPSY     COLONOSCOPY WITH PROPOFOL  N/A 01/18/2018   Procedure: COLONOSCOPY WITH PROPOFOL ;  Surgeon: [name], MD;  Location: [facility] ENDOSCOPY;  Service: Endoscopy;  Laterality: N/A;   COLONOSCOPY WITH PROPOFOL  N/A 02/25/2022   Procedure: COLONOSCOPY WITH PROPOFOL ;  Surgeon: [name], MD;  Location: [facility] ENDOSCOPY;  Service: Gastroenterology;  Laterality: N/A;   CYSTOSCOPY  11/11/2015   Procedure: CYSTOSCOPY;  Surgeon: [name], MD;  Location: [facility];  Service: Gynecology;;   DILATATION & CURETTAGE/HYSTEROSCOPY WITH MYOSURE N/A 03/21/2015   Procedure: DILATATION & CURETTAGE/HYSTEROSCOPY, POLYPECTOMY;  Surgeon: [name], MD;  Location: [facility];  Service: Gynecology;  Laterality: N/A;   DILATION AND CURETTAGE OF UTERUS  1981   [facility] BX    DILATION AND CURETTAGE OF UTERUS  06/25/2009   normal (Dr. [name])   LAPAROSCOPIC BILATERAL SALPINGECTOMY Bilateral 11/11/2015   Procedure: LAPAROSCOPIC BILATERAL SALPINGECTOMY;  Surgeon: [name], MD;  Location: [facility];  Service: Gynecology;  Laterality: Bilateral;   LAPAROSCOPIC SUPRACERVICAL HYSTERECTOMY  11/11/2015   Procedure: LAPAROSCOPIC SUPRACERVICAL HYSTERECTOMY CONVERTED TO OPEN;  Surgeon: [name], MD;  Location: [facility];  Service: Gynecology;;   LAPAROTOMY  11/11/2015  Procedure: LAPAROTOMY;  Surgeon: [name], MD;  Location: [facility];  Service: Gynecology;;   VAGINAL DELIVERY     NSVD x 2   Patient Active Problem List   Diagnosis Date Noted   Elevated serum creatinine 12/12/2023   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) 11/06/2023   Decreased range of motion of shoulder, left 10/10/2023   Right hip pain 06/13/2023   Healthcare maintenance 02/21/2023   Statin intolerance 02/21/2023   Rash 02/21/2023   Dysuria 02/21/2023   Family history of colorectal cancer 02/25/2022   Adenomatous polyp of colon  02/25/2022   Medicare welcome exam 12/28/2021   Lung nodule 05/19/2021   Respiratory failure with hypoxia (HCC) 05/06/2021   AKI (acute kidney injury) 05/06/2021   Hyperandrogenemia 05/27/2020   Multinodular goiter 05/27/2020   History of UTI 05/19/2020   Hammer toe of left foot 06/22/2019   Allergy history, drug 04/10/2018   Leg pain 04/10/2018   Status post laparotomy 11/11/2015   Diabetes mellitus without complication (HCC) 10/18/2015   Hyperglycemia 03/09/2015   Advance care planning 02/07/2014   Hyperlipidemia 02/07/2014   Atypical pneumonia 09/30/2012   Vitamin D  deficiency 10/09/2008   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   Overweight 01/18/2007   Essential hypertension 01/18/2007   Allergic rhinitis 11/29/2006   FIBROCYSTIC BREAST DISEASE 11/29/2006    PCP: [name], MD  REFERRING PROVIDER: [name], MD  REFERRING DIAG:  M54.50 (ICD-10-CM) - Low back pain, unspecified back pain laterality, unspecified chronicity, unspecified whether sciatica present    RATIONALE FOR EVALUATION AND TREATMENT: Rehabilitation  THERAPY DIAG:   ONSET DATE: Chronic   FOLLOW-UP APPT SCHEDULED WITH REFERRING PROVIDER: No    SUBJECTIVE:                                                                                                                                                                                         SUBJECTIVE STATEMENT:    Patient reports to OPPT with acute on chronic lower back pain.   PERTINENT HISTORY:   Alexandria Fry is a 68 y.o female with chief concern of lower back pain. She has had previous bout of physical therapy for same diagnosis but had to take a break due other medical concerns. She is receiving radiation therapy every day for her current diagnosis. She reports that her lower back has worsened due to the position of the radiation chair and recent sedentary lifestyle. She reports that she's always fatigued and contributes it to the cancer  treatments. She's also having intermittent L sided hip pain. She has noticed that she has developed muscle weakness in her  BLE.   She denies bowel/bladder changes, saddle paresthesia, h/o abdominal aneurysm, abdominal pain, chills/fever, night sweats.    PAIN:    Pain Intensity: Present: 2/10, Best: 0/10, Worst: 7/10 Pain location: Lower Back Pain Quality: intermittent, sharp, and stabbing  Radiating: Yes  Numbness/Tingling: No Focal Weakness: No Aggravating factors: Prolonged walking, sitting, standing  Relieving factors: Positional Changes  History of prior back injury, pain, surgery, or therapy: Yes  PRECAUTIONS: Fall  WEIGHT BEARING RESTRICTIONS: No  FALLS: Has patient fallen in last 6 months? No  Living Environment Lives with: lives with their family Lives in: House/apartment Stairs: Yes: Internal: 8 steps; can reach both Has following equipment at home: None  Prior level of function: Independent  Occupational demands: Retired  Hobbies:Reading   Patient Goals: I want to walk longer and do more activities   OBJECTIVE:  Patient Surveys  moDI: TBD   Cognition WNL    Gross Musculoskeletal Assessment Tremor: None Bulk: Normal Tone: Normal No visible step-off along spinal column, no signs of scoliosis  GAIT: Distance walked: 76m  Assistive device utilized: Single point cane Level of assistance: Complete Independence Comments: Reciprocal gait, L hip pain.   Posture: Lumbar lordosis: WNL Iliac crest height: Equal bilaterally Lumbar lateral shift: Negative  AROM AROM (Normal range in degrees) AROM   Lumbar   Flexion (65) 50%  Extension (30) 25%  Right lateral flexion (25) 100%  Left lateral flexion (25) 100%  Right rotation (30)   Left rotation (30)       Hip Right Left  Flexion (125) WNL WNL  Extension (15)    Abduction (40)    Adduction     Internal Rotation (45)    External Rotation (45)        Knee    Flexion (135)    Extension (0)         Ankle    Dorsiflexion (20)    Plantarflexion (50)    Inversion (35)    Eversion (15)    (* = pain; Blank rows = not tested)  LE MMT: MMT (out of 5) Right  Left   Hip flexion 4- 4-  Hip extension    Hip abduction (Seated) 4 4  Hip adduction 5 5  Hip internal rotation    Hip external rotation    Knee flexion 4- 4-  Knee extension 4- 4-  Ankle dorsiflexion 5 5  Ankle plantarflexion    Ankle inversion    Ankle eversion    (* = pain; Blank rows = not tested)  Sensation Grossly intact to light touch throughout bilateral LEs as determined by testing dermatomes L2-S2. Proprioception, stereognosis, and hot/cold testing deferred on this date.  Reflexes R/L Knee Jerk (L3/4): 2+/2+  Ankle Jerk (S1/2): 2+/2+   Muscle Length Hamstrings: R: Positive L: Positive  Palpation Location Right Left         Lumbar paraspinals 1 1  Quadratus Lumborum    Gluteus Maximus    Gluteus Medius    Deep hip external rotators    PSIS    Fortin's Area (SIJ)    Greater Trochanter    (Blank rows = not tested) Graded on 0-4 scale (0 = no pain, 1 = pain, 2 = pain with wincing/grimacing/flinching, 3 = pain with withdrawal, 4 = unwilling to allow palpation)  Passive Accessory  Motion Deferred   Special Tests Lumbar Radiculopathy and Discogenic:  SLR (SN 92, -LR 0.29): R: Positive L:  Negative Crossed SLR (SP 90): R: Negative  L: Negative  Hip: FABER (SN 81): R: Positive L: Negative FADIR (SN 94): R: Negative L: Negative  TODAY'S TREATMENT: DATE: 01/27/2024  Subjective: Patient reports 0/10 NPS in the lower back and in the R hip at start of session with rest. No further questions or concerns.  Therapeutic Exercise:   Seated Pallof Press  Resisted from R    1 x 10 - Green TB   Seated ClamShell   1 x 10 x 2s hold - Blue TB   2 x 10 x 2s hold - Grey TB  Lumbar Rollout - White Physioball   1 x 10 - Forward Direction  1 x 10 - L directoin  Therapeutic Activity:  NuStep L5-2  x 8 min x UE/LE (Seat 8) for LE endurance and strength; PT manually adjusted resistance throughout per patient tolerance.    Sit to Stand - Resisted   1 x 10 - From the incline bench   2 x 10 - From Elevated mat table   Lateral Stepping against resistance  4 x 12' - Green TB around thigh   PATIENT EDUCATION:  Education details: HEP, POC, Prognosis Person educated: Patient Education method: Medical Illustrator Education comprehension: verbalized understanding and returned demonstration   HOME EXERCISE PROGRAM:  Access Code: [email or web address] URL: [email or web address] Date: 01/25/2024 Prepared by: Lonni Miyoshi Ligas  Exercises - Supine Posterior Pelvic Tilt  - 2 x daily - 7 x weekly - 3 sets - 10 reps - 5 hold - Supine Active Straight Leg Raise  - 1 x daily - 3-4 x weekly - 3 sets - 10-12 reps - Mini Squat with Counter Support  - 1 x daily - 3-4 x weekly - 3 sets - 10-12 reps   ASSESSMENT:  CLINICAL IMPRESSION: Patient returns to OPPT in management of lower back pain and R hip pain. She reports improvements in pain following penetrex topical agent. She still has diffuse pain across the lower back with transitions and movement. PT focused on functional tasks such as STS and lateral stepping against resistance. She still has intermittent lower back pain that extends into the R hip limiting full participation with recreatoinal and community based activities. Patient's impairments limit her from full participation in recreational activities and functional movements such as transfers. Based on her presentation pt will benefit from skilled physical therapy in order to address her current deficits and maximize return to PLOF.    OBJECTIVE IMPAIRMENTS: Abnormal gait, decreased activity tolerance, decreased balance, decreased coordination, decreased endurance, difficulty walking, decreased ROM, decreased strength, impaired flexibility, postural dysfunction, and pain.   ACTIVITY  LIMITATIONS: carrying, lifting, bending, sitting, standing, squatting, sleeping, stairs, and transfers  PARTICIPATION LIMITATIONS: shopping, community activity, and yard work  PERSONAL FACTORS: Age, Past/current experiences, Time since onset of injury/illness/exacerbation, and 1-2 comorbidities: 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), chronic LBP, HTN.  are also affecting patient's functional outcome.   REHAB POTENTIAL: Fair Hx of CA and chronicity of pain  CLINICAL DECISION MAKING: Evolving/moderate complexity  EVALUATION COMPLEXITY: Moderate   GOALS: Goals reviewed with patient? No  SHORT TERM GOALS: Target date: 02/22/2023  Pt will be independent with HEP in order to improve strength and decrease back pain to improve pain-free function at home and work. Baseline: 01/25/2024: Initial HEP Provided Goal status: INITIAL   LONG TERM GOALS: Target date: 03/21/2024   Pt will increase 30s Sit to Stand by at least 6 reps in order to demonstrate significant improvement in lower extremity strength and  endurance.   Baseline: 01/25/2024: To Be Tested Goal status: INITIAL  2.  Pt will decrease worst back pain by at least 2 points on the NPRS in order to demonstrate clinically significant reduction in back pain. Baseline: 01/25/2024:07/10 Goal status: INITIAL  3.  Pt will decrease mODI score by at least 13 points in order demonstrate clinically significant reduction in back pain/disability.       Baseline: 01/25/2024: 10/50;  Goal status: INITIAL  4.  Pt will increase by at least 0.13 m/s in order to demonstrate clinically significant improvement in community ambulation.  Baseline: 01/25/2024: 1.00 m/s (9.92s - self selected) Goal status: INITIAL  5. Pt will increase Hip and Knee strength of by at least 1/2 MMT grade in order to demonstrate improvement in strength and function with walking and transfer.  Baseline: 01/25/2024:  MMT (out of 5) Right  Left    Hip flexion 4- 4-  Hip extension    Hip abduction (Seated) 4 4  Hip adduction 5 5  Hip internal rotation    Hip external rotation    Knee flexion 4- 4-  Knee extension 4- 4-   Goal status: INITIAL    PLAN: PT FREQUENCY: 2x/week   PT DURATION: 12 weeks   PLANNED INTERVENTIONS: Therapeutic exercises, Therapeutic activity, Neuromuscular re-education, Balance training, Gait training, Patient/Family education, Self Care, Joint mobilization, Joint manipulation, DME instructions, Dry Needling, Electrical stimulation, Spinal manipulation, Spinal mobilization, Cryotherapy, Moist heat, Manual therapy, and Re-evaluation.   PLAN FOR NEXT SESSION: Progress Hip strengthening, progress core strengthening, functional training (stairs, transfers)   Lonni Pall PT, DPT Physical Therapist- [place]  01/27/2024, 2:29 PM

## 2024-01-28 ENCOUNTER — Ambulatory Visit
Admission: RE | Admit: 2024-01-28 | Discharge: 2024-01-28 | Disposition: A | Payer: Medicare (Managed Care) | Source: Ambulatory Visit | Attending: Radiation Oncology | Admitting: Radiation Oncology

## 2024-01-28 ENCOUNTER — Other Ambulatory Visit: Payer: Self-pay

## 2024-01-28 DIAGNOSIS — Z510 Encounter for antineoplastic radiation therapy: Secondary | ICD-10-CM | POA: Diagnosis not present

## 2024-01-28 LAB — RAD ONC ARIA SESSION SUMMARY
Course Elapsed Days: 29
Plan Fractions Treated to Date: 17
Plan Prescribed Dose Per Fraction: 2 Gy
Plan Total Fractions Prescribed: 25
Plan Total Prescribed Dose: 50 Gy
Reference Point Dosage Given to Date: 34 Gy
Reference Point Session Dosage Given: 2 Gy
Session Number: 17

## 2024-01-30 DIAGNOSIS — Z510 Encounter for antineoplastic radiation therapy: Secondary | ICD-10-CM | POA: Diagnosis not present

## 2024-01-31 ENCOUNTER — Ambulatory Visit
Admission: RE | Admit: 2024-01-31 | Discharge: 2024-01-31 | Disposition: A | Payer: Medicare (Managed Care) | Source: Ambulatory Visit | Attending: Radiation Oncology | Admitting: Radiation Oncology

## 2024-01-31 ENCOUNTER — Other Ambulatory Visit: Payer: Self-pay

## 2024-01-31 DIAGNOSIS — Z510 Encounter for antineoplastic radiation therapy: Secondary | ICD-10-CM | POA: Diagnosis not present

## 2024-01-31 LAB — RAD ONC ARIA SESSION SUMMARY
Course Elapsed Days: 32
Plan Fractions Treated to Date: 18
Plan Prescribed Dose Per Fraction: 2 Gy
Plan Total Fractions Prescribed: 25
Plan Total Prescribed Dose: 50 Gy
Reference Point Dosage Given to Date: 36 Gy
Reference Point Session Dosage Given: 2 Gy
Session Number: 18

## 2024-02-01 ENCOUNTER — Ambulatory Visit: Payer: Medicare (Managed Care)

## 2024-02-01 ENCOUNTER — Ambulatory Visit
Admission: RE | Admit: 2024-02-01 | Discharge: 2024-02-01 | Disposition: A | Payer: Medicare (Managed Care) | Source: Ambulatory Visit | Attending: Radiation Oncology | Admitting: Radiation Oncology

## 2024-02-01 ENCOUNTER — Other Ambulatory Visit: Payer: Self-pay

## 2024-02-01 DIAGNOSIS — Z510 Encounter for antineoplastic radiation therapy: Secondary | ICD-10-CM | POA: Diagnosis not present

## 2024-02-01 LAB — RAD ONC ARIA SESSION SUMMARY
Course Elapsed Days: 33
Plan Fractions Treated to Date: 19
Plan Prescribed Dose Per Fraction: 2 Gy
Plan Total Fractions Prescribed: 25
Plan Total Prescribed Dose: 50 Gy
Reference Point Dosage Given to Date: 38 Gy
Reference Point Session Dosage Given: 2 Gy
Session Number: 19

## 2024-02-02 ENCOUNTER — Ambulatory Visit
Admission: RE | Admit: 2024-02-02 | Discharge: 2024-02-02 | Payer: Medicare (Managed Care) | Attending: Radiation Oncology | Admitting: Radiation Oncology

## 2024-02-02 ENCOUNTER — Other Ambulatory Visit: Payer: Self-pay

## 2024-02-02 ENCOUNTER — Inpatient Hospital Stay: Payer: Medicare (Managed Care)

## 2024-02-02 DIAGNOSIS — C50412 Malignant neoplasm of upper-outer quadrant of left female breast: Secondary | ICD-10-CM | POA: Diagnosis not present

## 2024-02-02 DIAGNOSIS — Z510 Encounter for antineoplastic radiation therapy: Secondary | ICD-10-CM | POA: Diagnosis not present

## 2024-02-02 LAB — RAD ONC ARIA SESSION SUMMARY
Course Elapsed Days: 34
Plan Fractions Treated to Date: 20
Plan Prescribed Dose Per Fraction: 2 Gy
Plan Total Fractions Prescribed: 25
Plan Total Prescribed Dose: 50 Gy
Reference Point Dosage Given to Date: 40 Gy
Reference Point Session Dosage Given: 2 Gy
Session Number: 20

## 2024-02-02 LAB — CBC (CANCER CENTER ONLY)
HCT: 47.4 % — ABNORMAL HIGH (ref 36.0–46.0)
Hemoglobin: 15.9 g/dL — ABNORMAL HIGH (ref 12.0–15.0)
MCH: 33.2 pg (ref 26.0–34.0)
MCHC: 33.5 g/dL (ref 30.0–36.0)
MCV: 99 fL (ref 80.0–100.0)
Platelet Count: 189 K/uL (ref 150–400)
RBC: 4.79 MIL/uL (ref 3.87–5.11)
RDW: 11.9 % (ref 11.5–15.5)
WBC Count: 9.3 K/uL (ref 4.0–10.5)
nRBC: 0 % (ref 0.0–0.2)

## 2024-02-03 ENCOUNTER — Ambulatory Visit: Payer: Medicare (Managed Care)

## 2024-02-03 ENCOUNTER — Other Ambulatory Visit: Payer: Self-pay

## 2024-02-03 DIAGNOSIS — M5416 Radiculopathy, lumbar region: Secondary | ICD-10-CM | POA: Diagnosis not present

## 2024-02-03 DIAGNOSIS — M5459 Other low back pain: Secondary | ICD-10-CM

## 2024-02-03 DIAGNOSIS — M6281 Muscle weakness (generalized): Secondary | ICD-10-CM

## 2024-02-03 DIAGNOSIS — Z510 Encounter for antineoplastic radiation therapy: Secondary | ICD-10-CM | POA: Diagnosis not present

## 2024-02-03 LAB — RAD ONC ARIA SESSION SUMMARY
Course Elapsed Days: 35
Plan Fractions Treated to Date: 21
Plan Prescribed Dose Per Fraction: 2 Gy
Plan Total Fractions Prescribed: 25
Plan Total Prescribed Dose: 50 Gy
Reference Point Dosage Given to Date: 42 Gy
Reference Point Session Dosage Given: 2 Gy
Session Number: 21

## 2024-02-03 NOTE — Therapy (Signed)
 OUTPATIENT PHYSICAL THERAPY THORACOLUMBAR TREATMENT   Patient Name: Alexandria Fry MRN: [id number] DOB:Jan 23, 1956, 68 y.o., female Today's Date: 02/03/2024  END OF SESSION:  PT End of Session - 02/03/24 1510     Visit Number 3    Number of Visits 17    Date for Recertification  03/21/24    Authorization - Visit Number 3    Authorization - Number of Visits 2    Progress Note Due on Visit 10    PT Start Time 1511    PT Stop Time 1555    PT Time Calculation (min) 44 min    Activity Tolerance Patient tolerated treatment well    Behavior During Therapy [facility] for tasks assessed/performed           Past Medical History:  Diagnosis Date   Allergic rhinitis    Allergy Age 78   Borax detergent...hives   Arthritis 11/22   Diabetes mellitus without complication (HCC)    Difficult intubation    a.) limited cervical ROM; b.) anterior anatomical airway   Fatty liver    GERD (gastroesophageal reflux disease)    Hirsutism    Hypertension    Irregular menses    with neg endometrial biopsy 2013   Menorrhagia    Murmur, cardiac    PCOS (polycystic ovarian syndrome)    Pneumonia 2014   PONV (postoperative nausea and vomiting)    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)    Thyroid  nodule    Past Surgical History:  Procedure Laterality Date   ABDOMINAL HYSTERECTOMY     AXILLARY SENTINEL NODE BIOPSY Left 11/11/2023   Procedure: BIOPSY, LYMPH NODE, SENTINEL, AXILLARY;  Surgeon: Jordis Laneta FALCON, MD;  Location: [facility];  Service: General;  Laterality: Left;   BREAST BIOPSY Left 10/28/2023   u/s bx heart clip path pending   BREAST BIOPSY Left 10/28/2023   US  LT BREAST BX W LOC DEV 1ST LESION IMG BX SPEC US  GUIDE 10/28/2023 [facility]   BREAST BIOPSY Left 11/09/2023   US  LT BREAST SAVI/RF TAG 1ST LESION US  GUIDE 11/09/2023 [facility]   BREAST LUMPECTOMY WITH RADIO FREQUENCY LOCALIZER Left 11/11/2023   Procedure: BREAST LUMPECTOMY WITH RADIO FREQUENCY  LOCALIZER;  Surgeon: Jordis Laneta FALCON, MD;  Location: [facility];  Service: General;  Laterality: Left;   CERVICAL CONE BIOPSY     COLONOSCOPY WITH PROPOFOL  N/A 01/18/2018   Procedure: COLONOSCOPY WITH PROPOFOL ;  Surgeon: [name], MD;  Location: [facility];  Service: Endoscopy;  Laterality: N/A;   COLONOSCOPY WITH PROPOFOL  N/A 02/25/2022   Procedure: COLONOSCOPY WITH PROPOFOL ;  Surgeon: [name], MD;  Location: [facility];  Service: Gastroenterology;  Laterality: N/A;   CYSTOSCOPY  11/11/2015   Procedure: CYSTOSCOPY;  Surgeon: [name], MD;  Location: [facility];  Service: Gynecology;;   DILATATION & CURETTAGE/HYSTEROSCOPY WITH MYOSURE N/A 03/21/2015   Procedure: DILATATION & CURETTAGE/HYSTEROSCOPY, POLYPECTOMY;  Surgeon: [name], MD;  Location: [facility];  Service: Gynecology;  Laterality: N/A;   DILATION AND CURETTAGE OF UTERUS  1981   [facility] BX    DILATION AND CURETTAGE OF UTERUS  06/25/2009   normal (Dr. [name])   LAPAROSCOPIC BILATERAL SALPINGECTOMY Bilateral 11/11/2015   Procedure: LAPAROSCOPIC BILATERAL SALPINGECTOMY;  Surgeon: [name], MD;  Location: [facility];  Service: Gynecology;  Laterality: Bilateral;   LAPAROSCOPIC SUPRACERVICAL HYSTERECTOMY  11/11/2015   Procedure: LAPAROSCOPIC SUPRACERVICAL HYSTERECTOMY CONVERTED TO OPEN;  Surgeon: [name], MD;  Location: [facility];  Service: Gynecology;;   LAPAROTOMY  11/11/2015   Procedure: LAPAROTOMY;  Surgeon: [name], MD;  Location: [facility];  Service: Gynecology;;   VAGINAL DELIVERY     NSVD x 2   Patient Active Problem List   Diagnosis Date Noted   Elevated serum creatinine 12/12/2023   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) 11/06/2023   Decreased range of motion of shoulder, left 10/10/2023   Right hip pain 06/13/2023   Healthcare maintenance 02/21/2023   Statin intolerance 02/21/2023   Rash 02/21/2023   Dysuria 02/21/2023   Family history of colorectal cancer  02/25/2022   Adenomatous polyp of colon 02/25/2022   Medicare welcome exam 12/28/2021   Lung nodule 05/19/2021   Respiratory failure with hypoxia (HCC) 05/06/2021   AKI (acute kidney injury) 05/06/2021   Hyperandrogenemia 05/27/2020   Multinodular goiter 05/27/2020   History of UTI 05/19/2020   Hammer toe of left foot 06/22/2019   Allergy history, drug 04/10/2018   Leg pain 04/10/2018   Status post laparotomy 11/11/2015   Diabetes mellitus without complication (HCC) 10/18/2015   Hyperglycemia 03/09/2015   Advance care planning 02/07/2014   Hyperlipidemia 02/07/2014   Atypical pneumonia 09/30/2012   Vitamin D  deficiency 10/09/2008   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   Overweight 01/18/2007   Essential hypertension 01/18/2007   Allergic rhinitis 11/29/2006   FIBROCYSTIC BREAST DISEASE 11/29/2006    PCP: [name], MD  REFERRING PROVIDER: [name], MD  REFERRING DIAG:  M54.50 (ICD-10-CM) - Low back pain, unspecified back pain laterality, unspecified chronicity, unspecified whether sciatica present    RATIONALE FOR EVALUATION AND TREATMENT: Rehabilitation  THERAPY DIAG:   ONSET DATE: Chronic   FOLLOW-UP APPT SCHEDULED WITH REFERRING PROVIDER: No    SUBJECTIVE:                                                                                                                                                                                         SUBJECTIVE STATEMENT:    Patient reports to OPPT with acute on chronic lower back pain.   PERTINENT HISTORY:   Alexandria Fry is a 68 y.o female with chief concern of lower back pain. She has had previous bout of physical therapy for same diagnosis but had to take a break due other medical concerns. She is receiving radiation therapy every day for her current diagnosis. She reports that her lower back has worsened due to the position of the radiation chair and recent sedentary lifestyle. She reports that she's always  fatigued and contributes it to the cancer treatments. She's also having intermittent L sided hip pain. She has  noticed that she has developed muscle weakness in her BLE.   She denies bowel/bladder changes, saddle paresthesia, h/o abdominal aneurysm, abdominal pain, chills/fever, night sweats.    PAIN:    Pain Intensity: Present: 2/10, Best: 0/10, Worst: 7/10 Pain location: Lower Back Pain Quality: intermittent, sharp, and stabbing  Radiating: Yes  Numbness/Tingling: No Focal Weakness: No Aggravating factors: Prolonged walking, sitting, standing  Relieving factors: Positional Changes  History of prior back injury, pain, surgery, or therapy: Yes  PRECAUTIONS: Fall  WEIGHT BEARING RESTRICTIONS: No  FALLS: Has patient fallen in last 6 months? No  Living Environment Lives with: lives with their family Lives in: House/apartment Stairs: Yes: Internal: 8 steps; can reach both Has following equipment at home: None  Prior level of function: Independent  Occupational demands: Retired  Hobbies:Reading   Patient Goals: I want to walk longer and do more activities   OBJECTIVE:  Patient Surveys  moDI: TBD   Cognition WNL    Gross Musculoskeletal Assessment Tremor: None Bulk: Normal Tone: Normal No visible step-off along spinal column, no signs of scoliosis  GAIT: Distance walked: 46m  Assistive device utilized: Single point cane Level of assistance: Complete Independence Comments: Reciprocal gait, L hip pain.   Posture: Lumbar lordosis: WNL Iliac crest height: Equal bilaterally Lumbar lateral shift: Negative  AROM AROM (Normal range in degrees) AROM   Lumbar   Flexion (65) 50%  Extension (30) 25%  Right lateral flexion (25) 100%  Left lateral flexion (25) 100%  Right rotation (30)   Left rotation (30)       Hip Right Left  Flexion (125) WNL WNL  Extension (15)    Abduction (40)    Adduction     Internal Rotation (45)    External Rotation (45)         Knee    Flexion (135)    Extension (0)        Ankle    Dorsiflexion (20)    Plantarflexion (50)    Inversion (35)    Eversion (15)    (* = pain; Blank rows = not tested)  LE MMT: MMT (out of 5) Right  Left   Hip flexion 4- 4-  Hip extension    Hip abduction (Seated) 4 4  Hip adduction 5 5  Hip internal rotation    Hip external rotation    Knee flexion 4- 4-  Knee extension 4- 4-  Ankle dorsiflexion 5 5  Ankle plantarflexion    Ankle inversion    Ankle eversion    (* = pain; Blank rows = not tested)  Sensation Grossly intact to light touch throughout bilateral LEs as determined by testing dermatomes L2-S2. Proprioception, stereognosis, and hot/cold testing deferred on this date.  Reflexes R/L Knee Jerk (L3/4): 2+/2+  Ankle Jerk (S1/2): 2+/2+   Muscle Length Hamstrings: R: Positive L: Positive  Palpation Location Right Left         Lumbar paraspinals 1 1  Quadratus Lumborum    Gluteus Maximus    Gluteus Medius    Deep hip external rotators    PSIS    Fortin's Area (SIJ)    Greater Trochanter    (Blank rows = not tested) Graded on 0-4 scale (0 = no pain, 1 = pain, 2 = pain with wincing/grimacing/flinching, 3 = pain with withdrawal, 4 = unwilling to allow palpation)  Passive Accessory  Motion Deferred   Special Tests Lumbar Radiculopathy and Discogenic:  SLR (SN 92, -LR 0.29): R: Positive  L:  Negative Crossed SLR (SP 90): R: Negative L: Negative  Hip: FABER (SN 81): R: Positive L: Negative FADIR (SN 94): R: Negative L: Negative  TODAY'S TREATMENT: DATE: 02/03/2024  Subjective: Patient reports 6/10 NPS in the lower back and L hip at start of session with rest. Patient reports her pain radiates from her back into the tail bone and can extend into the thigh. Pt reports that the pain stops her from full participation with recreational activities. No further questions or concerns.  Therapeutic Exercise:  Hamstring Stretch - Seated   R/L: 2 min ea leg    Lumbar Rollout - White [employer]ioball   1 x 10 - Forward Direction  Hooklying with OH reach   2 x 10 - 3 Kg MB   Hooklying with alternating marches and OH reach   2 x 20 - alternating 3 Kg MB   1 x 20 - alternating 2 Kg MB  Supine 90/90 with OH Reach   2 x 10 - 2 Kg MB   Supine 90/90 with alternating LE extension and OH reach   3 x 20 - 2 Kg MB   Time Spent reviewing HEP and exercises that are pain-free.   Therapeutic Activity:  Lateral Stepping against stepping  4 x 12' - Red TB around thigh  4 x 12' - Red TB around thigh   Standing Pallof Press   2 x 10 - Red TB   Forward Lunge onto Step   LLE on first step - 5 reps - pt report of pain in the L gluteal muscle radiating to lower back    PATIENT EDUCATION:  Education details: HEP, POC, Prognosis Person educated: Patient Education method: Medical Illustrator Education comprehension: verbalized understanding and returned demonstration   HOME EXERCISE PROGRAM:  Access Code: [email or web address] URL: [email or web address] Date: 02/03/2024 Prepared by: Lonni Tytionna Cloyd  Exercises - Supine Posterior Pelvic Tilt  - 2 x daily - 7 x weekly - 3 sets - 10 reps - 5 hold - Supine Active Straight Leg Raise  - 1 x daily - 3-4 x weekly - 3 sets - 10-12 reps - Mini Squat with Counter Support  - 1 x daily - 3-4 x weekly - 3 sets - 10-12 reps - Supine 90/90 Shoulder Flexion with Abdominal Bracing  - 1 x daily - 3-4 x weekly - 2-3 sets - 10 reps - Supine 90/90 Abdominal Bracing  - 2 x daily - 3-4 x weekly - 5 sets - 15-30s hold - Supine 90/90 Overhead Dumbbell Raise  - 2 x daily - 3-4 x weekly - 2-3 sets - 10 reps   ASSESSMENT:  CLINICAL IMPRESSION: Patient returns to OPPT in management of lower back pain and R hip pain. Main focus on improving lower back pain by strengthening gluteal muscles and core stabilization. Due to pain in the lower back; PT focused on mat level exercises for core stabilization. Good tolerance to  hooklying and seated exercises. She has diffuse pain with transitions and flexion based movements. Minor improvements in lower back pain pain with repeated extensions and reduced bilateral pain. PT suspecting spinal cord compression with bilateral pain and her endorsing two instances of her knees buckling. PT advised to speak to PCP if symptoms worsen. She endorsed improvements in lower back pain following PT interventions. PT updated HEP to include core stability exercises. Patient's impairments limit her from full participation in recreational activities and functional movements such as transfers. Based on her presentation pt will benefit from  skilled physical therapy in order to address her current deficits and maximize return to PLOF.   OBJECTIVE IMPAIRMENTS: Abnormal gait, decreased activity tolerance, decreased balance, decreased coordination, decreased endurance, difficulty walking, decreased ROM, decreased strength, impaired flexibility, postural dysfunction, and pain.   ACTIVITY LIMITATIONS: carrying, lifting, bending, sitting, standing, squatting, sleeping, stairs, and transfers  PARTICIPATION LIMITATIONS: shopping, community activity, and yard work  PERSONAL FACTORS: Age, Past/current experiences, Time since onset of injury/illness/exacerbation, and 1-2 comorbidities: 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), chronic LBP, HTN.  are also affecting patient's functional outcome.   REHAB POTENTIAL: Fair Hx of CA and chronicity of pain  CLINICAL DECISION MAKING: Evolving/moderate complexity  EVALUATION COMPLEXITY: Moderate   GOALS: Goals reviewed with patient? No  SHORT TERM GOALS: Target date: 02/22/2023  Pt will be independent with HEP in order to improve strength and decrease back pain to improve pain-free function at home and work. Baseline: 01/25/2024: Initial HEP Provided Goal status: INITIAL   LONG TERM GOALS: Target date: 03/21/2024   Pt will increase 30s  Sit to Stand by at least 6 reps in order to demonstrate significant improvement in lower extremity strength and endurance.   Baseline: 01/25/2024: To Be Tested Goal status: INITIAL  2.  Pt will decrease worst back pain by at least 2 points on the NPRS in order to demonstrate clinically significant reduction in back pain. Baseline: 01/25/2024:07/10 Goal status: INITIAL  3.  Pt will decrease mODI score by at least 13 points in order demonstrate clinically significant reduction in back pain/disability.       Baseline: 01/25/2024: 10/50;  Goal status: INITIAL  4.  Pt will increase by at least 0.13 m/s in order to demonstrate clinically significant improvement in community ambulation.  Baseline: 01/25/2024: 1.00 m/s (9.92s - self selected) Goal status: INITIAL  5. Pt will increase Hip and Knee strength of by at least 1/2 MMT grade in order to demonstrate improvement in strength and function with walking and transfer.  Baseline: 01/25/2024:  MMT (out of 5) Right  Left   Hip flexion 4- 4-  Hip extension    Hip abduction (Seated) 4 4  Hip adduction 5 5  Hip internal rotation    Hip external rotation    Knee flexion 4- 4-  Knee extension 4- 4-   Goal status: INITIAL    PLAN: PT FREQUENCY: 2x/week   PT DURATION: 12 weeks   PLANNED INTERVENTIONS: Therapeutic exercises, Therapeutic activity, Neuromuscular re-education, Balance training, Gait training, Patient/Family education, Self Care, Joint mobilization, Joint manipulation, DME instructions, Dry Needling, Electrical stimulation, Spinal manipulation, Spinal mobilization, Cryotherapy, Moist heat, Manual therapy, and Re-evaluation.   PLAN FOR NEXT SESSION: Progress Hip strengthening, progress core strengthening, functional training (stairs, transfers)   Lonni Pall PT, DPT Physical Therapist- [place]  02/03/2024, 3:13 PM

## 2024-02-04 ENCOUNTER — Other Ambulatory Visit: Payer: Self-pay

## 2024-02-04 ENCOUNTER — Ambulatory Visit: Payer: Medicare (Managed Care)

## 2024-02-04 ENCOUNTER — Ambulatory Visit
Admission: RE | Admit: 2024-02-04 | Discharge: 2024-02-04 | Payer: Medicare (Managed Care) | Attending: Radiation Oncology | Admitting: Radiation Oncology

## 2024-02-04 ENCOUNTER — Encounter: Payer: Self-pay | Admitting: Oncology

## 2024-02-04 DIAGNOSIS — Z510 Encounter for antineoplastic radiation therapy: Secondary | ICD-10-CM | POA: Diagnosis not present

## 2024-02-04 DIAGNOSIS — C50912 Malignant neoplasm of unspecified site of left female breast: Secondary | ICD-10-CM

## 2024-02-04 LAB — RAD ONC ARIA SESSION SUMMARY
Course Elapsed Days: 36
Plan Fractions Treated to Date: 22
Plan Prescribed Dose Per Fraction: 2 Gy
Plan Total Fractions Prescribed: 25
Plan Total Prescribed Dose: 50 Gy
Reference Point Dosage Given to Date: 44 Gy
Reference Point Session Dosage Given: 2 Gy
Session Number: 22

## 2024-02-07 ENCOUNTER — Other Ambulatory Visit: Payer: Self-pay

## 2024-02-07 ENCOUNTER — Ambulatory Visit: Payer: Medicare (Managed Care)

## 2024-02-07 DIAGNOSIS — Z510 Encounter for antineoplastic radiation therapy: Secondary | ICD-10-CM | POA: Diagnosis not present

## 2024-02-07 LAB — RAD ONC ARIA SESSION SUMMARY
Course Elapsed Days: 39
Plan Fractions Treated to Date: 23
Plan Prescribed Dose Per Fraction: 2 Gy
Plan Total Fractions Prescribed: 25
Plan Total Prescribed Dose: 50 Gy
Reference Point Dosage Given to Date: 46 Gy
Reference Point Session Dosage Given: 2 Gy
Session Number: 23

## 2024-02-08 ENCOUNTER — Ambulatory Visit: Payer: Medicare (Managed Care)

## 2024-02-08 ENCOUNTER — Other Ambulatory Visit: Payer: Self-pay

## 2024-02-08 DIAGNOSIS — M5416 Radiculopathy, lumbar region: Secondary | ICD-10-CM

## 2024-02-08 DIAGNOSIS — M5459 Other low back pain: Secondary | ICD-10-CM

## 2024-02-08 DIAGNOSIS — Z510 Encounter for antineoplastic radiation therapy: Secondary | ICD-10-CM | POA: Diagnosis not present

## 2024-02-08 DIAGNOSIS — M6281 Muscle weakness (generalized): Secondary | ICD-10-CM

## 2024-02-08 LAB — RAD ONC ARIA SESSION SUMMARY
Course Elapsed Days: 40
Plan Fractions Treated to Date: 24
Plan Prescribed Dose Per Fraction: 2 Gy
Plan Total Fractions Prescribed: 25
Plan Total Prescribed Dose: 50 Gy
Reference Point Dosage Given to Date: 48 Gy
Reference Point Session Dosage Given: 2 Gy
Session Number: 24

## 2024-02-08 MED ORDER — LETROZOLE 2.5 MG PO TABS: 2.5000 mg | ORAL_TABLET | Freq: Every day | ORAL | 1 refills | Status: DC

## 2024-02-08 NOTE — Therapy (Signed)
 " OUTPATIENT PHYSICAL THERAPY THORACOLUMBAR TREATMENT   Patient Name: Alexandria Fry MRN: [id number] DOB:[birth year], 68 y.o., female Today's Date: 02/08/2024  END OF SESSION:  PT End of Session - 02/08/24 1524     Visit Number 4    Number of Visits 17    Date for Recertification  03/21/24    Authorization - Number of Visits 2    Progress Note Due on Visit 10    PT Start Time 1516    PT Stop Time 1600    PT Time Calculation (min) 44 min    Activity Tolerance Patient tolerated treatment well    Behavior During Therapy [facility] for tasks assessed/performed            Past Medical History:  Diagnosis Date   Allergic rhinitis    Allergy Age 58   Borax detergent...hives   Arthritis 11/22   Diabetes mellitus without complication (HCC)    Difficult intubation    a.) limited cervical ROM; b.) anterior anatomical airway   Fatty liver    GERD (gastroesophageal reflux disease)    Hirsutism    Hypertension    Irregular menses    with neg endometrial biopsy 2013   Menorrhagia    Murmur, cardiac    PCOS (polycystic ovarian syndrome)    Pneumonia 2014   PONV (postoperative nausea and vomiting)    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)    Thyroid  nodule    Past Surgical History:  Procedure Laterality Date   ABDOMINAL HYSTERECTOMY     AXILLARY SENTINEL NODE BIOPSY Left 11/11/2023   Procedure: BIOPSY, LYMPH NODE, SENTINEL, AXILLARY;  Surgeon: Jordis Laneta FALCON, MD;  Location: [facility];  Service: General;  Laterality: Left;   BREAST BIOPSY Left 10/28/2023   u/s bx heart clip path pending   BREAST BIOPSY Left 10/28/2023   US  LT BREAST BX W LOC DEV 1ST LESION IMG BX SPEC US  GUIDE 10/28/2023 [facility]   BREAST BIOPSY Left 11/09/2023   US  LT BREAST SAVI/RF TAG 1ST LESION US  GUIDE 11/09/2023 [facility]   BREAST LUMPECTOMY WITH RADIO FREQUENCY LOCALIZER Left 11/11/2023   Procedure: BREAST LUMPECTOMY WITH RADIO FREQUENCY LOCALIZER;  Surgeon: Jordis Laneta FALCON, MD;   Location: [facility];  Service: General;  Laterality: Left;   CERVICAL CONE BIOPSY     COLONOSCOPY WITH PROPOFOL  N/A 01/18/2018   Procedure: COLONOSCOPY WITH PROPOFOL ;  Surgeon: [name], MD;  Location: [facility];  Service: Endoscopy;  Laterality: N/A;   COLONOSCOPY WITH PROPOFOL  N/A 02/25/2022   Procedure: COLONOSCOPY WITH PROPOFOL ;  Surgeon: [name], MD;  Location: [facility];  Service: Gastroenterology;  Laterality: N/A;   CYSTOSCOPY  11/11/2015   Procedure: CYSTOSCOPY;  Surgeon: [name], MD;  Location: [facility];  Service: Gynecology;;   DILATATION & CURETTAGE/HYSTEROSCOPY WITH MYOSURE N/A 03/21/2015   Procedure: DILATATION & CURETTAGE/HYSTEROSCOPY, POLYPECTOMY;  Surgeon: [name], MD;  Location: [facility];  Service: Gynecology;  Laterality: N/A;   DILATION AND CURETTAGE OF UTERUS  1981   [facility]    DILATION AND CURETTAGE OF UTERUS  06/25/2009   normal (Dr. [name])   LAPAROSCOPIC BILATERAL SALPINGECTOMY Bilateral 11/11/2015   Procedure: LAPAROSCOPIC BILATERAL SALPINGECTOMY;  Surgeon: [name], MD;  Location: [facility];  Service: Gynecology;  Laterality: Bilateral;   LAPAROSCOPIC SUPRACERVICAL HYSTERECTOMY  11/11/2015   Procedure: LAPAROSCOPIC SUPRACERVICAL HYSTERECTOMY CONVERTED TO OPEN;  Surgeon: [name], MD;  Location: [facility];  Service: Gynecology;;   LAPAROTOMY  11/11/2015   Procedure: LAPAROTOMY;  Surgeon: [name], MD;  Location: [facility];  Service: Gynecology;;   VAGINAL DELIVERY     NSVD x 2   Patient Active Problem List   Diagnosis Date Noted   Elevated serum creatinine 12/12/2023   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) 11/06/2023   Decreased range of motion of shoulder, left 10/10/2023   Right hip pain 06/13/2023   Healthcare maintenance 02/21/2023   Statin intolerance 02/21/2023   Rash 02/21/2023   Dysuria 02/21/2023   Family history of colorectal cancer 02/25/2022   Adenomatous polyp of colon  02/25/2022   Medicare welcome exam 12/28/2021   Lung nodule 05/19/2021   Respiratory failure with hypoxia (HCC) 05/06/2021   AKI (acute kidney injury) 05/06/2021   Hyperandrogenemia 05/27/2020   Multinodular goiter 05/27/2020   History of UTI 05/19/2020   Hammer toe of left foot 06/22/2019   Allergy history, drug 04/10/2018   Leg pain 04/10/2018   Status post laparotomy 11/11/2015   Diabetes mellitus without complication (HCC) 10/18/2015   Hyperglycemia 03/09/2015   Advance care planning 02/07/2014   Hyperlipidemia 02/07/2014   Atypical pneumonia 09/30/2012   Vitamin D  deficiency 10/09/2008   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   Overweight 01/18/2007   Essential hypertension 01/18/2007   Allergic rhinitis 11/29/2006   FIBROCYSTIC BREAST DISEASE 11/29/2006    PCP: [name], MD  REFERRING PROVIDER: [name], MD  REFERRING DIAG:  M54.50 (ICD-10-CM) - Low back pain, unspecified back pain laterality, unspecified chronicity, unspecified whether sciatica present    RATIONALE FOR EVALUATION AND TREATMENT: Rehabilitation  THERAPY DIAG:   ONSET DATE: Chronic   FOLLOW-UP APPT SCHEDULED WITH REFERRING PROVIDER: No    SUBJECTIVE:                                                                                                                                                                                         SUBJECTIVE STATEMENT:    Patient reports to OPPT with acute on chronic lower back pain.   PERTINENT HISTORY:   Alexandria Fry is a 68 y.o female with chief concern of lower back pain. She has had previous bout of physical therapy for same diagnosis but had to take a break due other medical concerns. She is receiving radiation therapy every day for her current diagnosis. She reports that her lower back has worsened due to the position of the radiation chair and recent sedentary lifestyle. She reports that she's always fatigued and contributes it to the cancer  treatments. She's also having intermittent L sided hip pain. She has noticed that she has developed muscle  weakness in her BLE.   She denies bowel/bladder changes, saddle paresthesia, h/o abdominal aneurysm, abdominal pain, chills/fever, night sweats.    PAIN:    Pain Intensity: Present: 2/10, Best: 0/10, Worst: 7/10 Pain location: Lower Back Pain Quality: intermittent, sharp, and stabbing  Radiating: Yes  Numbness/Tingling: No Focal Weakness: No Aggravating factors: Prolonged walking, sitting, standing  Relieving factors: Positional Changes  History of prior back injury, pain, surgery, or therapy: Yes  PRECAUTIONS: Fall  WEIGHT BEARING RESTRICTIONS: No  FALLS: Has patient fallen in last 6 months? No  Living Environment Lives with: lives with their family Lives in: House/apartment Stairs: Yes: Internal: 8 steps; can reach both Has following equipment at home: None  Prior level of function: Independent  Occupational demands: Retired  Hobbies:Reading   Patient Goals: I want to walk longer and do more activities   OBJECTIVE:  Patient Surveys  moDI: TBD   Cognition WNL    Gross Musculoskeletal Assessment Tremor: None Bulk: Normal Tone: Normal No visible step-off along spinal column, no signs of scoliosis  GAIT: Distance walked: 43m  Assistive device utilized: Single point cane Level of assistance: Complete Independence Comments: Reciprocal gait, L hip pain.   Posture: Lumbar lordosis: WNL Iliac crest height: Equal bilaterally Lumbar lateral shift: Negative  AROM AROM (Normal range in degrees) AROM   Lumbar   Flexion (65) 50%  Extension (30) 25%  Right lateral flexion (25) 100%  Left lateral flexion (25) 100%  Right rotation (30)   Left rotation (30)       Hip Right Left  Flexion (125) WNL WNL  Extension (15)    Abduction (40)    Adduction     Internal Rotation (45)    External Rotation (45)        Knee    Flexion (135)    Extension (0)         Ankle    Dorsiflexion (20)    Plantarflexion (50)    Inversion (35)    Eversion (15)    (* = pain; Blank rows = not tested)  LE MMT: MMT (out of 5) Right  Left   Hip flexion 4- 4-  Hip extension    Hip abduction (Seated) 4 4  Hip adduction 5 5  Hip internal rotation    Hip external rotation    Knee flexion 4- 4-  Knee extension 4- 4-  Ankle dorsiflexion 5 5  Ankle plantarflexion    Ankle inversion    Ankle eversion    (* = pain; Blank rows = not tested)  Sensation Grossly intact to light touch throughout bilateral LEs as determined by testing dermatomes L2-S2. Proprioception, stereognosis, and hot/cold testing deferred on this date.  Reflexes R/L Knee Jerk (L3/4): 2+/2+  Ankle Jerk (S1/2): 2+/2+   Muscle Length Hamstrings: R: Positive L: Positive  Palpation Location Right Left         Lumbar paraspinals 1 1  Quadratus Lumborum    Gluteus Maximus    Gluteus Medius    Deep hip external rotators    PSIS    Fortin's Area (SIJ)    Greater Trochanter    (Blank rows = not tested) Graded on 0-4 scale (0 = no pain, 1 = pain, 2 = pain with wincing/grimacing/flinching, 3 = pain with withdrawal, 4 = unwilling to allow palpation)  Passive Accessory  Motion Deferred   Special Tests Lumbar Radiculopathy and Discogenic:  SLR (SN 92, -LR 0.29): R: Positive L:  Negative Crossed SLR (SP  90): R: Negative L: Negative  Hip: FABER (SN 81): R: Positive L: Negative FADIR (SN 94): R: Negative L: Negative  TODAY'S TREATMENT: DATE: 02/08/2024  Subjective: Patient reports 2/10 pain in the tailbone and lower back. Patient reports persistent pain in the lower back and tail bone. No further questions or concerns.  Therapeutic Exercise:  Seated Pallof Press  2 x 10 - Green TB  Seated Horizontal Rotation against resistance   2 x 10 - Red TB   Supine Bridges 2 x 10 - minor pain in tail bone    Supine Hip Extension against Bolster   2 x 10 - Grey Bolster   Supine  Hip Extension - LE on Blue Bolster   1 x 10   Posterior Pelvic Tilt  2 x 10 x 3s hold  Gluteal Cross over stretch  30s/bout x 2 in order to improve gluteal tissue extensibility  Piriformis Stretch - Supine   30s/bout x 2 in order to improve gluteal tissue extensibility  Seated Side Overhead reach stretch   30s/bout x 3 in order to improve lumbar ROM and tissue extensibility  Therapeutic Activity:  NuStep L5-2 x 5 min x UE/LE (Seat 8) for LE endurance and strength; PT manually adjusted resistance throughout per patient tolerance.   TRX Squat   2 x 10   Partial Squats with Resistance   2 x 10 - Green TB around lower leg   Lateral Stepping against stepping  4 x 12 - against Green TB around   4  x 12' -     PATIENT EDUCATION:  Education details: HEP, POC, Prognosis Person educated: Patient Education method: Medical Illustrator Education comprehension: verbalized understanding and returned demonstration   HOME EXERCISE PROGRAM:  Access Code: [email or web address] URL: [email or web address] Date: 02/03/2024 Prepared by: Lonni Almas Rake  Exercises - Supine Posterior Pelvic Tilt  - 2 x daily - 7 x weekly - 3 sets - 10 reps - 5 hold - Supine Active Straight Leg Raise  - 1 x daily - 3-4 x weekly - 3 sets - 10-12 reps - Mini Squat with Counter Support  - 1 x daily - 3-4 x weekly - 3 sets - 10-12 reps - Supine 90/90 Shoulder Flexion with Abdominal Bracing  - 1 x daily - 3-4 x weekly - 2-3 sets - 10 reps - Supine 90/90 Abdominal Bracing  - 2 x daily - 3-4 x weekly - 5 sets - 15-30s hold - Supine 90/90 Overhead Dumbbell Raise  - 2 x daily - 3-4 x weekly - 2-3 sets - 10 reps   ASSESSMENT:  CLINICAL IMPRESSION: Patient returns to OPPT in management of lower back pain and R hip pain. Main focus on improving lower back pain by strengthening gluteal muscles and core stabilization. Patient with persistent tail bone pain with gluteal exercises and hip extension however  patient able to perform complete repetition. She continues to R lower back pain above iliac crest along in the quadratus lumborum region; mitigated with OH reach stretch. Patient's impairments limit her from full participation in recreational activities and functional movements such as transfers. Based on her presentation pt will benefit from skilled physical therapy in order to address her current deficits and maximize return to PLOF.   OBJECTIVE IMPAIRMENTS: Abnormal gait, decreased activity tolerance, decreased balance, decreased coordination, decreased endurance, difficulty walking, decreased ROM, decreased strength, impaired flexibility, postural dysfunction, and pain.   ACTIVITY LIMITATIONS: carrying, lifting, bending, sitting, standing, squatting, sleeping, stairs, and transfers  PARTICIPATION LIMITATIONS: shopping,  community activity, and yard work  PERSONAL FACTORS: Age, Past/current experiences, Time since onset of injury/illness/exacerbation, and 1-2 comorbidities: 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), chronic LBP, HTN.  are also affecting patient's functional outcome.   REHAB POTENTIAL: Fair Hx of CA and chronicity of pain  CLINICAL DECISION MAKING: Evolving/moderate complexity  EVALUATION COMPLEXITY: Moderate   GOALS: Goals reviewed with patient? No  SHORT TERM GOALS: Target date: 02/22/2023  Pt will be independent with HEP in order to improve strength and decrease back pain to improve pain-free function at home and work. Baseline: 01/25/2024: Initial HEP Provided Goal status: INITIAL   LONG TERM GOALS: Target date: 03/21/2024   Pt will increase 30s Sit to Stand by at least 6 reps in order to demonstrate significant improvement in lower extremity strength and endurance.   Baseline: 01/25/2024: To Be Tested Goal status: INITIAL  2.  Pt will decrease worst back pain by at least 2 points on the NPRS in order to demonstrate clinically significant  reduction in back pain. Baseline: 01/25/2024:07/10 Goal status: INITIAL  3.  Pt will decrease mODI score by at least 13 points in order demonstrate clinically significant reduction in back pain/disability.       Baseline: 01/25/2024: 10/50;  Goal status: INITIAL  4.  Pt will increase by at least 0.13 m/s in order to demonstrate clinically significant improvement in community ambulation.  Baseline: 01/25/2024: 1.00 m/s (9.92s - self selected) Goal status: INITIAL  5. Pt will increase Hip and Knee strength of by at least 1/2 MMT grade in order to demonstrate improvement in strength and function with walking and transfer.  Baseline: 01/25/2024:  MMT (out of 5) Right  Left   Hip flexion 4- 4-  Hip extension    Hip abduction (Seated) 4 4  Hip adduction 5 5  Hip internal rotation    Hip external rotation    Knee flexion 4- 4-  Knee extension 4- 4-   Goal status: INITIAL    PLAN: PT FREQUENCY: 2x/week   PT DURATION: 12 weeks   PLANNED INTERVENTIONS: Therapeutic exercises, Therapeutic activity, Neuromuscular re-education, Balance training, Gait training, Patient/Family education, Self Care, Joint mobilization, Joint manipulation, DME instructions, Dry Needling, Electrical stimulation, Spinal manipulation, Spinal mobilization, Cryotherapy, Moist heat, Manual therapy, and Re-evaluation.   PLAN FOR NEXT SESSION: Progress Hip strengthening, progress core strengthening, functional training (stairs, transfers)   Lonni Pall PT, DPT Physical Therapist- [place]  02/08/2024, 3:25 PM  "

## 2024-02-09 ENCOUNTER — Ambulatory Visit
Admission: RE | Admit: 2024-02-09 | Discharge: 2024-02-09 | Disposition: A | Payer: Medicare (Managed Care) | Source: Ambulatory Visit | Attending: Radiation Oncology | Admitting: Radiation Oncology

## 2024-02-09 ENCOUNTER — Other Ambulatory Visit: Payer: Self-pay

## 2024-02-09 DIAGNOSIS — Z510 Encounter for antineoplastic radiation therapy: Secondary | ICD-10-CM | POA: Diagnosis not present

## 2024-02-09 LAB — RAD ONC ARIA SESSION SUMMARY
Course Elapsed Days: 41
Plan Fractions Treated to Date: 25
Plan Prescribed Dose Per Fraction: 2 Gy
Plan Total Fractions Prescribed: 25
Plan Total Prescribed Dose: 50 Gy
Reference Point Dosage Given to Date: 50 Gy
Reference Point Session Dosage Given: 2 Gy
Session Number: 25

## 2024-02-14 ENCOUNTER — Ambulatory Visit: Payer: Medicare (Managed Care)

## 2024-02-14 ENCOUNTER — Other Ambulatory Visit: Payer: Self-pay

## 2024-02-14 DIAGNOSIS — Z510 Encounter for antineoplastic radiation therapy: Secondary | ICD-10-CM | POA: Diagnosis not present

## 2024-02-14 LAB — RAD ONC ARIA SESSION SUMMARY
Course Elapsed Days: 46
Plan Fractions Treated to Date: 1
Plan Prescribed Dose Per Fraction: 2 Gy
Plan Total Fractions Prescribed: 5
Plan Total Prescribed Dose: 10 Gy
Reference Point Dosage Given to Date: 2 Gy
Reference Point Session Dosage Given: 2 Gy
Session Number: 26

## 2024-02-14 MED ORDER — LETROZOLE 2.5 MG PO TABS: 2.5000 mg | ORAL_TABLET | Freq: Every day | ORAL | 1 refills | Status: AC

## 2024-02-14 NOTE — Addendum Note (Signed)
 Addended by: Jannah Guardiola on: 02/14/2024 01:42 PM   Modules accepted: Orders

## 2024-02-15 ENCOUNTER — Other Ambulatory Visit: Payer: Self-pay

## 2024-02-15 ENCOUNTER — Ambulatory Visit: Payer: Medicare (Managed Care)

## 2024-02-15 ENCOUNTER — Ambulatory Visit
Admission: RE | Admit: 2024-02-15 | Discharge: 2024-02-15 | Disposition: A | Discharge status: Home / Self Care | Payer: Medicare (Managed Care) | Source: Ambulatory Visit | Attending: Radiation Oncology | Admitting: Radiation Oncology

## 2024-02-15 DIAGNOSIS — Z510 Encounter for antineoplastic radiation therapy: Secondary | ICD-10-CM | POA: Diagnosis not present

## 2024-02-15 LAB — RAD ONC ARIA SESSION SUMMARY
Course Elapsed Days: 47
Plan Fractions Treated to Date: 2
Plan Prescribed Dose Per Fraction: 2 Gy
Plan Total Fractions Prescribed: 5
Plan Total Prescribed Dose: 10 Gy
Reference Point Dosage Given to Date: 4 Gy
Reference Point Session Dosage Given: 2 Gy
Session Number: 27

## 2024-02-16 ENCOUNTER — Other Ambulatory Visit: Payer: Self-pay

## 2024-02-16 ENCOUNTER — Ambulatory Visit: Payer: Medicare (Managed Care)

## 2024-02-16 ENCOUNTER — Ambulatory Visit
Admission: RE | Admit: 2024-02-16 | Discharge: 2024-02-16 | Disposition: A | Discharge status: Home / Self Care | Payer: Medicare (Managed Care) | Source: Ambulatory Visit | Attending: Radiation Oncology | Admitting: Radiation Oncology

## 2024-02-16 DIAGNOSIS — Z510 Encounter for antineoplastic radiation therapy: Secondary | ICD-10-CM | POA: Diagnosis not present

## 2024-02-16 LAB — RAD ONC ARIA SESSION SUMMARY
Course Elapsed Days: 48
Plan Fractions Treated to Date: 3
Plan Prescribed Dose Per Fraction: 2 Gy
Plan Total Fractions Prescribed: 5
Plan Total Prescribed Dose: 10 Gy
Reference Point Dosage Given to Date: 6 Gy
Reference Point Session Dosage Given: 2 Gy
Session Number: 28

## 2024-02-16 NOTE — Therapy (Incomplete)
 " OUTPATIENT PHYSICAL THERAPY THORACOLUMBAR TREATMENT   Patient Name: Alexandria Fry MRN: [id number] DOB:20-Jun-1955, 68 y.o., female Today's Date: 02/16/2024  END OF SESSION:      Past Medical History:  Diagnosis Date   Allergic rhinitis    Allergy Age 17   Borax detergent...hives   Arthritis 11/22   Diabetes mellitus without complication (HCC)    Difficult intubation    a.) limited cervical ROM; b.) anterior anatomical airway   Fatty liver    GERD (gastroesophageal reflux disease)    Hirsutism    Hypertension    Irregular menses    with neg endometrial biopsy 2013   Menorrhagia    Murmur, cardiac    PCOS (polycystic ovarian syndrome)    Pneumonia 2014   PONV (postoperative nausea and vomiting)    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)    Thyroid  nodule    Past Surgical History:  Procedure Laterality Date   ABDOMINAL HYSTERECTOMY     AXILLARY SENTINEL NODE BIOPSY Left 11/11/2023   Procedure: BIOPSY, LYMPH NODE, SENTINEL, AXILLARY;  Surgeon: Jordis Laneta FALCON, MD;  Location: [facility];  Service: General;  Laterality: Left;   BREAST BIOPSY Left 10/28/2023   u/s bx heart clip path pending   BREAST BIOPSY Left 10/28/2023   US  LT BREAST BX W LOC DEV 1ST LESION IMG BX SPEC US  GUIDE 10/28/2023 [facility]   BREAST BIOPSY Left 11/09/2023   US  LT BREAST SAVI/RF TAG 1ST LESION US  GUIDE 11/09/2023 [facility]   BREAST LUMPECTOMY WITH RADIO FREQUENCY LOCALIZER Left 11/11/2023   Procedure: BREAST LUMPECTOMY WITH RADIO FREQUENCY LOCALIZER;  Surgeon: Jordis Laneta FALCON, MD;  Location: [facility];  Service: General;  Laterality: Left;   CERVICAL CONE BIOPSY     COLONOSCOPY WITH PROPOFOL  N/A 01/18/2018   Procedure: COLONOSCOPY WITH PROPOFOL ;  Surgeon: [name], MD;  Location: [facility] END[facility]COPY;  Service: Endoscopy;  Laterality: N/A;   COLONOSCOPY WITH PROPOFOL  N/A 02/25/2022   Procedure: COLONOSCOPY WITH PROPOFOL ;  Surgeon: [name], MD;  Location: [facility]  ENDOSCOPY;  Service: Gastroenterology;  Laterality: N/A;   CYSTOSCOPY  11/11/2015   Procedure: CYSTOSCOPY;  Surgeon: [name], MD;  Location: [facility];  Service: Gynecology;;   DILATATION & CURETTAGE/HYSTEROSCOPY WITH MYOSURE N/A 03/21/2015   Procedure: DILATATION & CURETTAGE/HYSTEROSCOPY, POLYPECTOMY;  Surgeon: [name], MD;  Location: [facility];  Service: Gynecology;  Laterality: N/A;   DILATION AND CURETTAGE OF UTERUS  1981   [facility]    DILATION AND CURETTAGE OF UTERUS  06/25/2009   normal (Dr. [name])   LAPAROSCOPIC BILATERAL SALPINGECTOMY Bilateral 11/11/2015   Procedure: LAPAROSCOPIC BILATERAL SALPINGECTOMY;  Surgeon: [name], MD;  Location: [facility];  Service: Gynecology;  Laterality: Bilateral;   LAPAROSCOPIC SUPRACERVICAL HYSTERECTOMY  11/11/2015   Procedure: LAPAROSCOPIC SUPRACERVICAL HYSTERECTOMY CONVERTED TO OPEN;  Surgeon: [name], MD;  Location: [facility];  Service: Gynecology;;   LAPAROTOMY  11/11/2015   Procedure: LAPAROTOMY;  Surgeon: [name], MD;  Location: [facility];  Service: Gynecology;;   VAGINAL DELIVERY     NSVD x 2   Patient Active Problem List   Diagnosis Date Noted   Elevated serum creatinine 12/12/2023   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) 11/06/2023   Decreased range of motion of shoulder, left 10/10/2023   Right hip pain 06/13/2023   Healthcare maintenance 02/21/2023   Statin intolerance 02/21/2023   Rash 02/21/2023   Dysuria 02/21/2023   Family  history of colorectal cancer 02/25/2022   Adenomatous polyp of colon 02/25/2022   Medicare welcome exam 12/28/2021   Lung nodule 05/19/2021   Respiratory failure with hypoxia (HCC) 05/06/2021   AKI (acute kidney injury) 05/06/2021   Hyperandrogenemia 05/27/2020   Multinodular goiter 05/27/2020   History of UTI 05/19/2020   Hammer toe of left foot 06/22/2019   Allergy history, drug 04/10/2018   Leg pain 04/10/2018   Status post laparotomy 11/11/2015    Diabetes mellitus without complication (HCC) 10/18/2015   Hyperglycemia 03/09/2015   Advance care planning 02/07/2014   Hyperlipidemia 02/07/2014   Atypical pneumonia 09/30/2012   Vitamin D  deficiency 10/09/2008   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   Overweight 01/18/2007   Essential hypertension 01/18/2007   Allergic rhinitis 11/29/2006   FIBROCYSTIC BREAST DISEASE 11/29/2006    PCP: [name], MD  REFERRING PROVIDER: [name], MD  REFERRING DIAG:  M54.50 (ICD-10-CM) - Low back pain, unspecified back pain laterality, unspecified chronicity, unspecified whether sciatica present    RATIONALE FOR EVALUATION AND TREATMENT: Rehabilitation  THERAPY DIAG:   ONSET DATE: Chronic   FOLLOW-UP APPT SCHEDULED WITH REFERRING PROVIDER: No    SUBJECTIVE:                                                                                                                                                                                         SUBJECTIVE STATEMENT:    Patient reports to OPPT with acute on chronic lower back pain.   PERTINENT HISTORY:   Alexandria Fry is a 68 y.o female with chief concern of lower back pain. She has had previous bout of physical therapy for same diagnosis but had to take a break due other medical concerns. She is receiving radiation therapy every day for her current diagnosis. She reports that her lower back has worsened due to the position of the radiation chair and recent sedentary lifestyle. She reports that she's always fatigued and contributes it to the cancer treatments. She's also having intermittent L sided hip pain. She has noticed that she has developed muscle weakness in her BLE.   She denies bowel/bladder changes, saddle paresthesia, h/o abdominal aneurysm, abdominal pain, chills/fever, night sweats.    PAIN:    Pain Intensity: Present: 2/10, Best: 0/10, Worst: 7/10 Pain location: Lower Back Pain Quality: intermittent, sharp, and stabbing   Radiating: Yes  Numbness/Tingling: No Focal Weakness: No Aggravating factors: Prolonged walking, sitting, standing  Relieving factors: Positional Changes  History of prior back injury, pain, surgery, or therapy: Yes  PRECAUTIONS: Fall  WEIGHT BEARING RESTRICTIONS: No  FALLS: Has patient fallen in last 6 months? No  Living  Environment Lives with: lives with their family Lives in: House/apartment Stairs: Yes: Internal: 8 steps; can reach both Has following equipment at home: None  Prior level of function: Independent  Occupational demands: Retired  Hobbies:Reading   Patient Goals: I want to walk longer and do more activities   OBJECTIVE:  Patient Surveys  moDI: TBD   Cognition WNL    Gross Musculoskeletal Assessment Tremor: None Bulk: Normal Tone: Normal No visible step-off along spinal column, no signs of scoliosis  GAIT: Distance walked: 34m  Assistive device utilized: Single point cane Level of assistance: Complete Independence Comments: Reciprocal gait, L hip pain.   Posture: Lumbar lordosis: WNL Iliac crest height: Equal bilaterally Lumbar lateral shift: Negative  AROM AROM (Normal range in degrees) AROM   Lumbar   Flexion (65) 50%  Extension (30) 25%  Right lateral flexion (25) 100%  Left lateral flexion (25) 100%  Right rotation (30)   Left rotation (30)       Hip Right Left  Flexion (125) WNL WNL  Extension (15)    Abduction (40)    Adduction     Internal Rotation (45)    External Rotation (45)        Knee    Flexion (135)    Extension (0)        Ankle    Dorsiflexion (20)    Plantarflexion (50)    Inversion (35)    Eversion (15)    (* = pain; Blank rows = not tested)  LE MMT: MMT (out of 5) Right  Left   Hip flexion 4- 4-  Hip extension    Hip abduction (Seated) 4 4  Hip adduction 5 5  Hip internal rotation    Hip external rotation    Knee flexion 4- 4-  Knee extension 4- 4-  Ankle dorsiflexion 5 5  Ankle  plantarflexion    Ankle inversion    Ankle eversion    (* = pain; Blank rows = not tested)  Sensation Grossly intact to light touch throughout bilateral LEs as determined by testing dermatomes L2-S2. Proprioception, stereognosis, and hot/cold testing deferred on this date.  Reflexes R/L Knee Jerk (L3/4): 2+/2+  Ankle Jerk (S1/2): 2+/2+   Muscle Length Hamstrings: R: Positive L: Positive  Palpation Location Right Left         Lumbar paraspinals 1 1  Quadratus Lumborum    Gluteus Maximus    Gluteus Medius    Deep hip external rotators    PSIS    Fortin's Area (SIJ)    Greater Trochanter    (Blank rows = not tested) Graded on 0-4 scale (0 = no pain, 1 = pain, 2 = pain with wincing/grimacing/flinching, 3 = pain with withdrawal, 4 = unwilling to allow palpation)  Passive Accessory  Motion Deferred   Special Tests Lumbar Radiculopathy and Discogenic:  SLR (SN 92, -LR 0.29): R: Positive L:  Negative Crossed SLR (SP 90): R: Negative L: Negative  Hip: FABER (SN 81): R: Positive L: Negative FADIR (SN 94): R: Negative L: Negative  TODAY'S TREATMENT: DATE: 02/16/2024  Subjective: ***. No further questions or concerns.  Physical Performance Measures:   30s Sit to Stand: *** Reps.   Therapeutic Exercise:  Seated Pallof Press  2 x 10 - Green TB  Seated Horizontal Rotation against resistance   2 x 10 - Red TB   Supine Bridges 2 x 10 - minor pain in tail bone    Supine Hip Extension against Bolster  2 x 10 - Grey Bolster   Supine Hip Extension - LE on Blue Bolster   1 x 10   Posterior Pelvic Tilt  2 x 10 x 3s hold  Gluteal Cross over stretch  30s/bout x 2 in order to improve gluteal tissue extensibility  Piriformis Stretch - Supine   30s/bout x 2 in order to improve gluteal tissue extensibility  Seated Side Overhead reach stretch   30s/bout x 3 in order to improve lumbar ROM and tissue extensibility  Therapeutic Activity:  NuStep L5-2 x 5 min x UE/LE  (Seat 8) for LE endurance and strength; PT manually adjusted resistance throughout per patient tolerance.   TRX Squat   2 x 10   Partial Squats with Resistance   2 x 10 - Green TB around lower leg   Lateral Stepping against stepping  4 x 12 - against Green TB around   4  x 12' -     PATIENT EDUCATION:  Education details: HEP, POC, Prognosis Person educated: Patient Education method: Medical Illustrator Education comprehension: verbalized understanding and returned demonstration   HOME EXERCISE PROGRAM:  Access Code: [email or web address] URL: [email or web address] Date: 02/03/2024 Prepared by: Lonni Brookelin Felber  Exercises - Supine Posterior Pelvic Tilt  - 2 x daily - 7 x weekly - 3 sets - 10 reps - 5 hold - Supine Active Straight Leg Raise  - 1 x daily - 3-4 x weekly - 3 sets - 10-12 reps - Mini Squat with Counter Support  - 1 x daily - 3-4 x weekly - 3 sets - 10-12 reps - Supine 90/90 Shoulder Flexion with Abdominal Bracing  - 1 x daily - 3-4 x weekly - 2-3 sets - 10 reps - Supine 90/90 Abdominal Bracing  - 2 x daily - 3-4 x weekly - 5 sets - 15-30s hold - Supine 90/90 Overhead Dumbbell Raise  - 2 x daily - 3-4 x weekly - 2-3 sets - 10 reps   ASSESSMENT:  CLINICAL IMPRESSION: Patient returns to OPPT in management of lower back pain and R hip pain. Continued focus on deep gluteal strengthening and improving core stability. Patient only able to tolerate mat level exercises interventions due to pain with sitting and transitions. Reviewed seated OH reach stretch for R lower back pain with good return demonstration. She still presents with persistent pain along SI joint limiting her full participation in recreational activities and functional movements such sitting, transfers and prolonged standing. PT plans to continue progressing exercises as tolerated. Based on her presentation pt will benefit from skilled physical therapy in order to address her current deficits and  maximize return to PLOF.   ***.   OBJECTIVE IMPAIRMENTS: Abnormal gait, decreased activity tolerance, decreased balance, decreased coordination, decreased endurance, difficulty walking, decreased ROM, decreased strength, impaired flexibility, postural dysfunction, and pain.   ACTIVITY LIMITATIONS: carrying, lifting, bending, sitting, standing, squatting, sleeping, stairs, and transfers  PARTICIPATION LIMITATIONS: shopping, community activity, and yard work  PERSONAL FACTORS: Age, Past/current experiences, Time since onset of injury/illness/exacerbation, and 1-2 comorbidities: 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), chronic LBP, HTN.  are also affecting patient's functional outcome.   REHAB POTENTIAL: Fair Hx of CA and chronicity of pain  CLINICAL DECISION MAKING: Evolving/moderate complexity  EVALUATION COMPLEXITY: Moderate   GOALS: Goals reviewed with patient? No  SHORT TERM GOALS: Target date: 02/22/2023  Pt will be independent with HEP in order to improve strength and decrease back pain to improve pain-free function at home and  work. Baseline: 01/25/2024: Initial HEP Provided Goal status: INITIAL   LONG TERM GOALS: Target date: 03/21/2024   Pt will increase 30s Sit to Stand by at least 6 reps in order to demonstrate significant improvement in lower extremity strength and endurance.   Baseline: 01/25/2024: To Be Tested Goal status: INITIAL  2.  Pt will decrease worst back pain by at least 2 points on the NPRS in order to demonstrate clinically significant reduction in back pain. Baseline: 01/25/2024:07/10 Goal status: INITIAL  3.  Pt will decrease mODI score by at least 13 points in order demonstrate clinically significant reduction in back pain/disability.       Baseline: 01/25/2024: 10/50;  Goal status: INITIAL  4.  Pt will increase by at least 0.13 m/s in order to demonstrate clinically significant improvement in community ambulation.   Baseline: 01/25/2024: 1.00 m/s (9.92s - self selected) Goal status: INITIAL  5. Pt will increase Hip and Knee strength of by at least 1/2 MMT grade in order to demonstrate improvement in strength and function with walking and transfer.  Baseline: 01/25/2024:  MMT (out of 5) Right  Left   Hip flexion 4- 4-  Hip extension    Hip abduction (Seated) 4 4  Hip adduction 5 5  Hip internal rotation    Hip external rotation    Knee flexion 4- 4-  Knee extension 4- 4-   Goal status: INITIAL    PLAN: PT FREQUENCY: 2x/week   PT DURATION: 12 weeks   PLANNED INTERVENTIONS: Therapeutic exercises, Therapeutic activity, Neuromuscular re-education, Balance training, Gait training, Patient/Family education, Self Care, Joint mobilization, Joint manipulation, DME instructions, Dry Needling, Electrical stimulation, Spinal manipulation, Spinal mobilization, Cryotherapy, Moist heat, Manual therapy, and Re-evaluation.   PLAN FOR NEXT SESSION: Progress Hip strengthening, progress core strengthening, functional training (stairs, transfers)   Lonni Pall PT, DPT Physical Therapist- [place]  02/16/2024, 8:22 AM  "

## 2024-02-18 ENCOUNTER — Ambulatory Visit: Payer: Medicare (Managed Care)

## 2024-02-18 ENCOUNTER — Ambulatory Visit
Admission: RE | Admit: 2024-02-18 | Discharge: 2024-02-18 | Disposition: A | Discharge status: Home / Self Care | Payer: Medicare (Managed Care) | Source: Ambulatory Visit | Attending: Radiation Oncology | Admitting: Radiation Oncology

## 2024-02-18 ENCOUNTER — Other Ambulatory Visit: Payer: Self-pay

## 2024-02-18 DIAGNOSIS — C773 Secondary and unspecified malignant neoplasm of axilla and upper limb lymph nodes: Secondary | ICD-10-CM | POA: Insufficient documentation

## 2024-02-18 DIAGNOSIS — Z170 Estrogen receptor positive status [ER+]: Secondary | ICD-10-CM | POA: Diagnosis not present

## 2024-02-18 DIAGNOSIS — C50412 Malignant neoplasm of upper-outer quadrant of left female breast: Secondary | ICD-10-CM | POA: Insufficient documentation

## 2024-02-18 DIAGNOSIS — Z510 Encounter for antineoplastic radiation therapy: Secondary | ICD-10-CM | POA: Diagnosis present

## 2024-02-18 LAB — RAD ONC ARIA SESSION SUMMARY
Course Elapsed Days: 50
Plan Fractions Treated to Date: 4
Plan Prescribed Dose Per Fraction: 2 Gy
Plan Total Fractions Prescribed: 5
Plan Total Prescribed Dose: 10 Gy
Reference Point Dosage Given to Date: 8 Gy
Reference Point Session Dosage Given: 2 Gy
Session Number: 29

## 2024-02-21 ENCOUNTER — Ambulatory Visit: Payer: Medicare (Managed Care)

## 2024-02-21 ENCOUNTER — Encounter: Payer: Self-pay | Admitting: *Deleted

## 2024-02-21 ENCOUNTER — Ambulatory Visit
Admission: RE | Admit: 2024-02-21 | Discharge: 2024-02-21 | Disposition: A | Discharge status: Home / Self Care | Payer: Medicare (Managed Care) | Source: Ambulatory Visit | Attending: Radiation Oncology | Admitting: Radiation Oncology

## 2024-02-21 ENCOUNTER — Other Ambulatory Visit: Payer: Self-pay

## 2024-02-21 DIAGNOSIS — Z510 Encounter for antineoplastic radiation therapy: Secondary | ICD-10-CM | POA: Diagnosis not present

## 2024-02-21 LAB — RAD ONC ARIA SESSION SUMMARY
Course Elapsed Days: 53
Plan Fractions Treated to Date: 5
Plan Prescribed Dose Per Fraction: 2 Gy
Plan Total Fractions Prescribed: 5
Plan Total Prescribed Dose: 10 Gy
Reference Point Dosage Given to Date: 10 Gy
Reference Point Session Dosage Given: 2 Gy
Session Number: 30

## 2024-02-22 ENCOUNTER — Telehealth: Payer: Self-pay

## 2024-02-22 NOTE — Telephone Encounter (Signed)
 Copied from CRM [id number]. Topic: Appointments - Scheduling Inquiry for Clinic >> Feb 22, 2024 10:18 AM Alexandria Fry wrote: Reason for CRM: Patient called in regarding needing labs 04/20 for her 04/23 physical, would like a callback to be scheduled for labs once order has been put in

## 2024-02-22 NOTE — Radiation Completion Notes (Signed)
 Patient Name: Alexandria Fry, Alexandria Fry MRN: [id number] Date of Birth: Nov 30, 1955 Referring Physician: ANNAH SKENE, M.D. Date of Service: 2024-02-22 Radiation Oncologist: Marcey Penton, M.D. [facility] [place]                             RADIATION ONCOLOGY END OF TREATMENT NOTE     Diagnosis: C50.412 Malignant neoplasm of upper-outer quadrant of left female breast Staging on 2023-11-30: 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) T=pT1c, N=pN22mi, M=cM0 Staging on 2023-11-06: 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) T=cT1b, N=cN0, M=cM0 Intent: Curative     HPI: Patient is a 69 year old female who presents with an abnormal mammogram of her left breast.  Screening mammogram detected abnormality in the left outer breast this was confirmed on diagnostic mammogram showing an indeterminate 8 mm mass in the left outer breast.  No suspicious left axillary adenopathy was identified.  She underwent ultrasound-guided biopsy which was positive for invasive mammary carcinoma.  She then underwent a wide local excision for an overall grade two 1.5 cm invasive carcinoma ER/PR positive HER2/neu not overexpressed.  Margins were clear but close at 2 mm.  1 of 3 sentinel lymph nodes had a micrometastatic met measuring 0.3 mm.  Patient had a lowRecurrence risk.  Score on Oncotype DX.  She did have a hematoma after surgery although it has resolved.  She will not have systemic treatment based on her Oncotype DX score.  She otherwise specifically denies breast tenderness cough or bone pain.  She is seen today for radiation oncology opinion.      ==========DELIVERED PLANS==========  First Treatment Date: 2023-12-30 Last Treatment Date: 2024-02-21   Plan Name: Breast_L_BH Site: Breast, Left Technique: 3D Mode: Photon Dose Per Fraction: 2 Gy Prescribed Dose (Delivered / Prescribed): 50 Gy / 50 Gy Prescribed Fxs (Delivered / Prescribed): 25 / 25   Plan Name:  Breast_L_Bst Site: Breast, Left Technique: 3D Mode: Photon Dose Per Fraction: 2 Gy Prescribed Dose (Delivered / Prescribed): 10 Gy / 10 Gy Prescribed Fxs (Delivered / Prescribed): 5 / 5     ==========ON TREATMENT VISIT DATES========== 2024-01-04, 2024-01-11, 2024-01-19, 2024-01-25, 2024-02-01, 2024-02-08, 2024-02-15     ==========UPCOMING VISITS========== 03/22/2024 [facility] RAD ONCOLOGY FOLLOW UP 30 Penton Marcey, MD  03/17/2024 [facility] MED ONC EST PT Skene Annah BROCKS, MD  03/17/2024 [facility]C LAB [facility]  03/15/2024 [facility] SURGICAL FOLLOW UP 15 Pabon, Diego F, MD  03/02/2024 [facility] Krista Lonni PARAS, PT  02/28/2024 [facility] Go, Lonni PARAS, PT  02/23/2024 [facility] Go, Lonni PARAS, PT        ==========APPENDIX - ON TREATMENT VISIT NOTES==========   See weekly On Treatment Notes in Epic for details in the Media tab (listed as Progress notes on the On Treatment Visit Dates listed above).

## 2024-02-23 ENCOUNTER — Ambulatory Visit: Payer: Medicare (Managed Care) | Attending: Family Medicine

## 2024-02-23 DIAGNOSIS — M6281 Muscle weakness (generalized): Secondary | ICD-10-CM | POA: Insufficient documentation

## 2024-02-23 DIAGNOSIS — M5416 Radiculopathy, lumbar region: Secondary | ICD-10-CM | POA: Diagnosis present

## 2024-02-23 DIAGNOSIS — M5459 Other low back pain: Secondary | ICD-10-CM | POA: Diagnosis present

## 2024-02-23 NOTE — Therapy (Signed)
 " OUTPATIENT PHYSICAL THERAPY THORACOLUMBAR TREATMENT   Patient Name: Alexandria Fry MRN: [id number] DOB:05-Dec-1955, 69 y.o., female Today's Date: 02/23/2024  END OF SESSION:  PT End of Session - 02/23/24 1522     Visit Number 5    Number of Visits 17    Date for Recertification  03/21/24    Authorization - Visit Number 5    Authorization - Number of Visits --    Progress Note Due on Visit 10    PT Start Time 1516    PT Stop Time 1555    PT Time Calculation (min) 39 min    Activity Tolerance Patient tolerated treatment well    Behavior During Therapy Alexandria Fry for tasks assessed/performed             Past Medical History:  Diagnosis Date   Allergic rhinitis    Allergy Age 79   Borax detergent...hives   Arthritis 11/22   Diabetes mellitus without complication (HCC)    Difficult intubation    a.) limited cervical ROM; b.) anterior anatomical airway   Fatty liver    GERD (gastroesophageal reflux disease)    Hirsutism    Hypertension    Irregular menses    with neg endometrial biopsy 2013   Menorrhagia    Murmur, cardiac    PCOS (polycystic ovarian syndrome)    Pneumonia 2014   PONV (postoperative nausea and vomiting)    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)    Thyroid  nodule    Past Surgical History:  Procedure Laterality Date   ABDOMINAL HYSTERECTOMY     AXILLARY SENTINEL NODE BIOPSY Left 11/11/2023   Procedure: BIOPSY, LYMPH NODE, SENTINEL, AXILLARY;  Surgeon: Alexandria Laneta FALCON, MD;  Location: [facility];  Service: General;  Laterality: Left;   BREAST BIOPSY Left 10/28/2023   u/s bx heart clip path pending   BREAST BIOPSY Left 10/28/2023   US  LT BREAST BX W LOC DEV 1ST LESION IMG BX SPEC US  GUIDE 10/28/2023 [facility]   BREAST BIOPSY Left 11/09/2023   US  LT BREAST SAVI/RF TAG 1ST LESION US  GUIDE 11/09/2023 [facility]   BREAST LUMPECTOMY WITH RADIO FREQUENCY LOCALIZER Left 11/11/2023   Procedure: BREAST LUMPECTOMY WITH RADIO FREQUENCY  LOCALIZER;  Surgeon: Alexandria Laneta FALCON, MD;  Location: [facility];  Service: General;  Laterality: Left;   CERVICAL CONE BIOPSY     COLONOSCOPY WITH PROPOFOL  N/A 01/18/2018   Procedure: COLONOSCOPY WITH PROPOFOL ;  Surgeon: Alexandria Gladis PENNER, MD;  Location: [facility];  Service: Endoscopy;  Laterality: N/A;   COLONOSCOPY WITH PROPOFOL  N/A 02/25/2022   Procedure: COLONOSCOPY WITH PROPOFOL ;  Surgeon: Alexandria Bi, MD;  Location: [facility];  Service: Gastroenterology;  Laterality: N/A;   CYSTOSCOPY  11/11/2015   Procedure: CYSTOSCOPY;  Surgeon: Alexandria BROCKS Ward, MD;  Location: [facility];  Service: Gynecology;;   DILATATION & CURETTAGE/HYSTEROSCOPY WITH MYOSURE N/A 03/21/2015   Procedure: DILATATION & CURETTAGE/HYSTEROSCOPY, POLYPECTOMY;  Surgeon: Alexandria BROCKS Ward, MD;  Location: [facility];  Service: Gynecology;  Laterality: N/A;   DILATION AND CURETTAGE OF UTERUS  1981   [facility] BX    DILATION AND CURETTAGE OF UTERUS  06/25/2009   normal (Dr. [name])   LAPAROSCOPIC BILATERAL SALPINGECTOMY Bilateral 11/11/2015   Procedure: LAPAROSCOPIC BILATERAL SALPINGECTOMY;  Surgeon: Alexandria BROCKS Ward, MD;  Location: [facility];  Service: Gynecology;  Laterality: Bilateral;   LAPAROSCOPIC SUPRACERVICAL HYSTERECTOMY  11/11/2015   Procedure: LAPAROSCOPIC SUPRACERVICAL HYSTERECTOMY CONVERTED TO OPEN;  Surgeon: Alexandria BROCKS Ward, MD;  Location:  [facility];  Service: Gynecology;;   LAPAROTOMY  11/11/2015   Procedure: LAPAROTOMY;  Surgeon: Alexandria BROCKS Ward, MD;  Location: [facility];  Service: Gynecology;;   VAGINAL DELIVERY     NSVD x 2   Patient Active Problem List   Diagnosis Date Noted   Elevated serum creatinine 12/12/2023   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) 11/06/2023   Decreased range of motion of shoulder, left 10/10/2023   Right hip pain 06/13/2023   Healthcare maintenance 02/21/2023   Statin intolerance 02/21/2023   Rash 02/21/2023   Dysuria 02/21/2023   Family history of colorectal cancer  02/25/2022   Adenomatous polyp of colon 02/25/2022   Medicare welcome exam 12/28/2021   Lung nodule 05/19/2021   Respiratory failure with hypoxia (HCC) 05/06/2021   AKI (acute kidney injury) 05/06/2021   Hyperandrogenemia 05/27/2020   Multinodular goiter 05/27/2020   History of UTI 05/19/2020   Hammer toe of left foot 06/22/2019   Allergy history, drug 04/10/2018   Leg pain 04/10/2018   Status post laparotomy 11/11/2015   Diabetes mellitus without complication (HCC) 10/18/2015   Hyperglycemia 03/09/2015   Advance care planning 02/07/2014   Hyperlipidemia 02/07/2014   Atypical pneumonia 09/30/2012   Vitamin D  deficiency 10/09/2008   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   Overweight 01/18/2007   Essential hypertension 01/18/2007   Allergic rhinitis 11/29/2006   FIBROCYSTIC BREAST DISEASE 11/29/2006    PCP: Alexandria Arlyss RAMAN, MD  REFERRING PROVIDER: [name], MD  REFERRING DIAG:  M54.50 (ICD-10-CM) - Low back pain, unspecified back pain laterality, unspecified chronicity, unspecified whether sciatica present    RATIONALE FOR EVALUATION AND TREATMENT: Rehabilitation  THERAPY DIAG:   ONSET DATE: Chronic   FOLLOW-UP APPT SCHEDULED WITH REFERRING PROVIDER: No    SUBJECTIVE:                                                                                                                                                                                         SUBJECTIVE STATEMENT:    Patient reports to OPPT with acute on chronic lower back pain.   PERTINENT HISTORY:   Alexandria Fry is a 69 y.o female with chief concern of lower back pain. She has had previous bout of physical therapy for same diagnosis but had to take a break due other medical concerns. She is receiving radiation therapy every day for her current diagnosis. She reports that her lower back has worsened due to the position of the radiation chair and recent sedentary lifestyle. She reports that she's always  fatigued and contributes it to the cancer treatments. She's also having intermittent L sided hip  pain. She has noticed that she has developed muscle weakness in her BLE.   She denies bowel/bladder changes, saddle paresthesia, h/o abdominal aneurysm, abdominal pain, chills/fever, night sweats.    PAIN:    Pain Intensity: Present: 2/10, Best: 0/10, Worst: 7/10 Pain location: Lower Back Pain Quality: intermittent, sharp, and stabbing  Radiating: Yes  Numbness/Tingling: No Focal Weakness: No Aggravating factors: Prolonged walking, sitting, standing  Relieving factors: Positional Changes  History of prior back injury, pain, surgery, or therapy: Yes  PRECAUTIONS: Fall  WEIGHT BEARING RESTRICTIONS: No  FALLS: Has patient fallen in last 6 months? No  Living Environment Lives with: lives with their family Lives in: House/apartment Stairs: Yes: Internal: 8 steps; can reach both Has following equipment at home: None  Prior level of function: Independent  Occupational demands: Retired  Hobbies:Reading   Patient Goals: I want to walk longer and do more activities   OBJECTIVE:  Patient Surveys  moDI: TBD   Cognition WNL    Gross Musculoskeletal Assessment Tremor: None Bulk: Normal Tone: Normal No visible step-off along spinal column, no signs of scoliosis  GAIT: Distance walked: 55m  Assistive device utilized: Single point cane Level of assistance: Complete Independence Comments: Reciprocal gait, L hip pain.   Posture: Lumbar lordosis: WNL Iliac crest height: Equal bilaterally Lumbar lateral shift: Negative  AROM AROM (Normal range in degrees) AROM   Lumbar   Flexion (65) 50%  Extension (30) 25%  Right lateral flexion (25) 100%  Left lateral flexion (25) 100%  Right rotation (30)   Left rotation (30)       Hip Right Left  Flexion (125) WNL WNL  Extension (15)    Abduction (40)    Adduction     Internal Rotation (45)    External Rotation (45)         Knee    Flexion (135)    Extension (0)        Ankle    Dorsiflexion (20)    Plantarflexion (50)    Inversion (35)    Eversion (15)    (* = pain; Blank rows = not tested)  LE MMT: MMT (out of 5) Right  Left   Hip flexion 4- 4-  Hip extension    Hip abduction (Seated) 4 4  Hip adduction 5 5  Hip internal rotation    Hip external rotation    Knee flexion 4- 4-  Knee extension 4- 4-  Ankle dorsiflexion 5 5  Ankle plantarflexion    Ankle inversion    Ankle eversion    (* = pain; Blank rows = not tested)  Sensation Grossly intact to light touch throughout bilateral LEs as determined by testing dermatomes L2-S2. Proprioception, stereognosis, and hot/cold testing deferred on this date.  Reflexes R/L Knee Jerk (L3/4): 2+/2+  Ankle Jerk (S1/2): 2+/2+   Muscle Length Hamstrings: R: Positive L: Positive  Palpation Location Right Left         Lumbar paraspinals 1 1  Quadratus Lumborum    Gluteus Maximus    Gluteus Medius    Deep hip external rotators    PSIS    Fortin's Area (SIJ)    Greater Trochanter    (Blank rows = not tested) Graded on 0-4 scale (0 = no pain, 1 = pain, 2 = pain with wincing/grimacing/flinching, 3 = pain with withdrawal, 4 = unwilling to allow palpation)  Passive Accessory  Motion Deferred   Special Tests Lumbar Radiculopathy and Discogenic:  SLR (SN 92, -LR  0.29): R: Positive L:  Negative Crossed SLR (SP 90): R: Negative L: Negative  Hip: FABER (SN 81): R: Positive L: Negative FADIR (SN 94): R: Negative L: Negative  TODAY'S TREATMENT: DATE: 02/23/2024  Subjective: Patient reports 0/10 in rested/seated position however transfers from sit to stand reaches 4-6/10 NPS. She reports that she had a stabbing pain in the R knee, mitigated with rest. . No further questions or concerns.  Physical Performance Measures:   5TSTS: 15.38s - Pain thorughout test  Therapeutic Exercise:  Standing Hip Abduction   R/L: 1 x 10 ea side - Red TB - Pain  with LLE  Sidelying Hip Abduction  R/L: 3 x 10 ea side - Multimodal cues for proper sidelying position    Hooklying Hip Abduction  3 x 20 reps   Hooklying Marches with TA activation  3 x 20 - alt marches against blue   Seated Pallof Press  2 x 10 - Blue TB   Seated Piriformis Stretch   R/L: 30s/bout x 2 in order to improve ROM/muscle tension/tissue extensibility   Therapeutic Activity: NuStep L5-2 x 5 min x UE/LE (Seat 8) for LE endurance and strength; PT manually adjusted resistance throughout per patient tolerance.   Partial Squats   3 x 10 - Last set with 3 Kg MB  - intermittent bouts of pain in the tail bone   PATIENT EDUCATION:  Education details: HEP, POC, Prognosis Person educated: Patient Education method: Medical Illustrator Education comprehension: verbalized understanding and returned demonstration   HOME EXERCISE PROGRAM:  Access Code: [email or web address] URL: [email or web address] Date: 02/03/2024 Prepared by: Lonni Malichi Palardy  Exercises - Supine Posterior Pelvic Tilt  - 2 x daily - 7 x weekly - 3 sets - 10 reps - 5 hold - Supine Active Straight Leg Raise  - 1 x daily - 3-4 x weekly - 3 sets - 10-12 reps - Mini Squat with Counter Support  - 1 x daily - 3-4 x weekly - 3 sets - 10-12 reps - Supine 90/90 Shoulder Flexion with Abdominal Bracing  - 1 x daily - 3-4 x weekly - 2-3 sets - 10 reps - Supine 90/90 Abdominal Bracing  - 2 x daily - 3-4 x weekly - 5 sets - 15-30s hold - Supine 90/90 Overhead Dumbbell Raise  - 2 x daily - 3-4 x weekly - 2-3 sets - 10 reps   ASSESSMENT:  CLINICAL IMPRESSION: Patient returns to OPPT in management of lower back pain and R hip pain. Continued focus on deep gluteal strengthening and improving core stability. Patient still has intermittent pain with transitions from sitting <> supine in the SI joint. Patient still only able to tolerate mat level exercises due to pain with standing and weight bearing in the lower  back.  5TSTS tested today, she scored 15.67s ; A score of 12 seconds or more may indicate a need for further fall risk assessment in community-dwelling older adults. PT plans to continue progressing exercises as tolerated. Based on her presentation pt will benefit from skilled physical therapy in order to address her current deficits and maximize return to PLOF.   OBJECTIVE IMPAIRMENTS: Abnormal gait, decreased activity tolerance, decreased balance, decreased coordination, decreased endurance, difficulty walking, decreased ROM, decreased strength, impaired flexibility, postural dysfunction, and pain.   ACTIVITY LIMITATIONS: carrying, lifting, bending, sitting, standing, squatting, sleeping, stairs, and transfers  PARTICIPATION LIMITATIONS: shopping, community activity, and yard work  PERSONAL FACTORS: Age, Past/current experiences, Time since onset of injury/illness/exacerbation, and 1-2 comorbidities: Primary cancer  of upper outer quadrant of left breast (HCC), chronic LBP, HTN.  are also affecting patient's functional outcome.   REHAB POTENTIAL: Fair Hx of CA and chronicity of pain  CLINICAL DECISION MAKING: Evolving/moderate complexity  EVALUATION COMPLEXITY: Moderate   GOALS: Goals reviewed with patient? No  SHORT TERM GOALS: Target date: 02/22/2023  Pt will be independent with HEP in order to improve strength and decrease back pain to improve pain-free function at home and work. Baseline: 01/25/2024: Initial HEP Provided Goal status: INITIAL   LONG TERM GOALS: Target date: 03/21/2024   Pt will increase 30s Sit to Stand by at least 6 reps in order to demonstrate significant improvement in lower extremity strength and endurance.   Baseline: 01/25/2024: To Be Tested Goal status: INITIAL  2.  Pt will decrease worst back pain by at least 2 points on the NPRS in order to demonstrate clinically significant reduction in back pain. Baseline: 01/25/2024:07/10 Goal status: INITIAL  3.   Pt will decrease mODI score by at least 13 points in order demonstrate clinically significant reduction in back pain/disability.       Baseline: 01/25/2024: 10/50;  Goal status: INITIAL  4.  Pt will increase by at least 0.13 m/s in order to demonstrate clinically significant improvement in community ambulation.  Baseline: 01/25/2024: 1.00 m/s (9.92s - self selected) Goal status: INITIAL  5. Pt will increase Hip and Knee strength of by at least 1/2 MMT grade in order to demonstrate improvement in strength and function with walking and transfer.  Baseline: 01/25/2024:  MMT (out of 5) Right  Left   Hip flexion 4- 4-  Hip extension    Hip abduction (Seated) 4 4  Hip adduction 5 5  Hip internal rotation    Hip external rotation    Knee flexion 4- 4-  Knee extension 4- 4-   Goal status: INITIAL    PLAN: PT FREQUENCY: 2x/week   PT DURATION: 12 weeks   PLANNED INTERVENTIONS: Therapeutic exercises, Therapeutic activity, Neuromuscular re-education, Balance training, Gait training, Patient/Family education, Self Care, Joint mobilization, Joint manipulation, DME instructions, Dry Needling, Electrical stimulation, Spinal manipulation, Spinal mobilization, Cryotherapy, Moist heat, Manual therapy, and Re-evaluation.   PLAN FOR NEXT SESSION: Progress Hip strengthening, progress core strengthening, functional training (stairs, transfers)   Lonni Pall PT, DPT Physical Therapist- [place]  02/23/2024, 3:23 PM  "

## 2024-02-27 NOTE — Telephone Encounter (Signed)
 Please schedule a lab visit ahead of time.  This far out, the lab should be able to send me a reminder ahead of time to put in the orders much closer to the visit.

## 2024-02-28 ENCOUNTER — Ambulatory Visit: Payer: Medicare (Managed Care)

## 2024-02-29 ENCOUNTER — Ambulatory Visit: Payer: Medicare (Managed Care)

## 2024-02-29 ENCOUNTER — Encounter: Payer: Self-pay | Admitting: Family Medicine

## 2024-03-02 ENCOUNTER — Telehealth: Payer: Self-pay | Admitting: Family Medicine

## 2024-03-02 ENCOUNTER — Ambulatory Visit: Payer: Medicare (Managed Care)

## 2024-03-02 NOTE — Telephone Encounter (Signed)
 Noted. Thanks.

## 2024-03-02 NOTE — Telephone Encounter (Signed)
 See mychart message and please triage patient about hip pain.  Thanks.

## 2024-03-02 NOTE — Telephone Encounter (Signed)
 I spoke with pt; pt said lt hip and LS pain started 02/20/24. Pain has worsened last few days. The pain is both dull and sharp and pain is consistent but varies with degree of pain with a lot depending on positioning. Walking makes pain worse. Offered pt appt for today with different provider at [facility]. Pt said she could not come in today and request appt on 03/06/24. Appt given to pt with dr Cleatus on 03/06/24 at 9:00 am. With UC & ED precautions and pt voiced understanding. Pt said now not need immediate appt. Sending note to Dr Cleatus and [facility] pool.

## 2024-03-05 NOTE — Telephone Encounter (Signed)
Noted. Will discuss with patient at office visit.

## 2024-03-06 ENCOUNTER — Ambulatory Visit: Admitting: Family Medicine

## 2024-03-06 ENCOUNTER — Encounter: Payer: Self-pay | Admitting: Family Medicine

## 2024-03-06 ENCOUNTER — Ambulatory Visit

## 2024-03-06 VITALS — BP 138/80 | HR 87 | Temp 98.9°F | Ht 65.75 in | Wt 204.5 lb

## 2024-03-06 DIAGNOSIS — E119 Type 2 diabetes mellitus without complications: Secondary | ICD-10-CM | POA: Diagnosis not present

## 2024-03-06 DIAGNOSIS — M79606 Pain in leg, unspecified: Secondary | ICD-10-CM

## 2024-03-06 DIAGNOSIS — Z7984 Long term (current) use of oral hypoglycemic drugs: Secondary | ICD-10-CM | POA: Diagnosis not present

## 2024-03-06 LAB — BASIC METABOLIC PANEL WITH GFR
BUN: 22 mg/dL (ref 6–23)
CO2: 29 meq/L (ref 19–32)
Calcium: 11 mg/dL — ABNORMAL HIGH (ref 8.4–10.5)
Chloride: 102 meq/L (ref 96–112)
Creatinine, Ser: 1.2 mg/dL (ref 0.40–1.20)
GFR: 46.73 mL/min — ABNORMAL LOW
Glucose, Bld: 162 mg/dL — ABNORMAL HIGH (ref 70–99)
Potassium: 4.3 meq/L (ref 3.5–5.1)
Sodium: 139 meq/L (ref 135–145)

## 2024-03-06 LAB — HEMOGLOBIN A1C: Hgb A1c MFr Bld: 6.7 % — ABNORMAL HIGH (ref 4.6–6.5)

## 2024-03-06 MED ORDER — TRAMADOL HCL 50 MG PO TABS: 25.0000 mg | ORAL_TABLET | Freq: Three times a day (TID) | ORAL | Status: AC | PRN

## 2024-03-06 NOTE — Progress Notes (Unsigned)
 H/o DM2.  Still on jardiance  every other day.   Hip vs back pain. Had been using heat frequently recently.  Pain better last night, was able to lay on her back. Pain started in the tailbone, radiated to the L buttock, the had pain around the front of the hip.  Could radiate down the back of the leg to the foot.  One day with foot paresthesias that resolved in the meantime.    SLR minimally positive.  L lower back and midline back not ttp.    Pain started 01/2024.  Heat has clearly helped.

## 2024-03-06 NOTE — Patient Instructions (Addendum)
 Go to the lab on the way out.   If you have mychart we'll likely use that to update you.    Take care.  Glad to see you. Keep going with PT- ask for treatment for sciatica.  If not better, then let me know   Tramadol  as needed for pain in the meantime.  Sedation caution.

## 2024-03-07 ENCOUNTER — Ambulatory Visit

## 2024-03-07 DIAGNOSIS — M5459 Other low back pain: Secondary | ICD-10-CM

## 2024-03-07 DIAGNOSIS — M6281 Muscle weakness (generalized): Secondary | ICD-10-CM

## 2024-03-07 DIAGNOSIS — M5416 Radiculopathy, lumbar region: Secondary | ICD-10-CM

## 2024-03-07 NOTE — Therapy (Signed)
 " OUTPATIENT PHYSICAL THERAPY THORACOLUMBAR TREATMENT   Patient Name: Alexandria Fry MRN: [id number] DOB:1955-10-18, 69 y.o., female Today's Date: 03/07/2024  END OF SESSION:  PT End of Session - 03/07/24 0901     Visit Number 6    Number of Visits 17    Date for Recertification  03/21/24    Progress Note Due on Visit 10    PT Start Time 0900    PT Stop Time 0940    PT Time Calculation (min) 40 min    Activity Tolerance Patient tolerated treatment well    Behavior During Therapy [facility] for tasks assessed/performed             Past Medical History:  Diagnosis Date   Allergic rhinitis    Allergy Age 37   Borax detergent...hives   Arthritis 11/22   Diabetes mellitus without complication (HCC)    Difficult intubation    a.) limited cervical ROM; b.) anterior anatomical airway   Fatty liver    GERD (gastroesophageal reflux disease)    Hirsutism    Hypertension    Irregular menses    with neg endometrial biopsy 2013   Menorrhagia    Murmur, cardiac    PCOS (polycystic ovarian syndrome)    Pneumonia 2014   PONV (postoperative nausea and vomiting)    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)    Thyroid  nodule    Past Surgical History:  Procedure Laterality Date   ABDOMINAL HYSTERECTOMY     AXILLARY SENTINEL NODE BIOPSY Left 11/11/2023   Procedure: BIOPSY, LYMPH NODE, SENTINEL, AXILLARY;  Surgeon: Jordis Laneta FALCON, MD;  Location: [facility];  Service: General;  Laterality: Left;   BREAST BIOPSY Left 10/28/2023   u/s bx heart clip path pending   BREAST BIOPSY Left 10/28/2023   US  LT BREAST BX W LOC DEV 1ST LESION IMG BX SPEC US  GUIDE 10/28/2023 [facility]   BREAST BIOPSY Left 11/09/2023   US  LT BREAST SAVI/RF TAG 1ST LESION US  GUIDE 11/09/2023 [facility]   BREAST LUMPECTOMY WITH RADIO FREQUENCY LOCALIZER Left 11/11/2023   Procedure: BREAST LUMPECTOMY WITH RADIO FREQUENCY LOCALIZER;  Surgeon: Jordis Laneta FALCON, MD;  Location: [facility];  Service:  General;  Laterality: Left;   CERVICAL CONE BIOPSY     COLONOSCOPY WITH PROPOFOL  N/A 01/18/2018   Procedure: COLONOSCOPY WITH PROPOFOL ;  Surgeon: [name], MD;  Location: [facility];  Service: Endoscopy;  Laterality: N/A;   COLONOSCOPY WITH PROPOFOL  N/A 02/25/2022   Procedure: COLONOSCOPY WITH PROPOFOL ;  Surgeon: [name], MD;  Location: [facility] ENDOSCOPY;  Service: Gastroenterology;  Laterality: N/A;   CYSTOSCOPY  11/11/2015   Procedure: CYSTOSCOPY;  Surgeon: [name], MD;  Location: [facility];  Service: Gynecology;;   DILATATION & CURETTAGE/HYSTEROSCOPY WITH MYOSURE N/A 03/21/2015   Procedure: DILATATION & CURETTAGE/HYSTEROSCOPY, POLYPECTOMY;  Surgeon: [name], MD;  Location: [facility];  Service: Gynecology;  Laterality: N/A;   DILATION AND CURETTAGE OF UTERUS  1981   [facility] BX    DILATION AND CURETTAGE OF UTERUS  06/25/2009   normal (Dr. [name])   LAPAROSCOPIC BILATERAL SALPINGECTOMY Bilateral 11/11/2015   Procedure: LAPAROSCOPIC BILATERAL SALPINGECTOMY;  Surgeon: [name], MD;  Location: [facility];  Service: Gynecology;  Laterality: Bilateral;   LAPAROSCOPIC SUPRACERVICAL HYSTERECTOMY  11/11/2015   Procedure: LAPAROSCOPIC SUPRACERVICAL HYSTERECTOMY CONVERTED TO OPEN;  Surgeon: [name], MD;  Location: [facility];  Service: Gynecology;;   LAPAROTOMY  11/11/2015   Procedure: LAPAROTOMY;  Surgeon: Mitzie  C Ward, MD;  Location: [facility];  Service: Gynecology;;   VAGINAL DELIVERY     NSVD x 2   Patient Active Problem List   Diagnosis Date Noted   Elevated serum creatinine 12/12/2023   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) 11/06/2023   Decreased range of motion of shoulder, left 10/10/2023   Right hip pain 06/13/2023   Healthcare maintenance 02/21/2023   Statin intolerance 02/21/2023   Rash 02/21/2023   Dysuria 02/21/2023   Family history of colorectal cancer 02/25/2022   Adenomatous polyp of colon 02/25/2022   Medicare welcome  exam 12/28/2021   Lung nodule 05/19/2021   Respiratory failure with hypoxia (HCC) 05/06/2021   AKI (acute kidney injury) 05/06/2021   Hyperandrogenemia 05/27/2020   Multinodular goiter 05/27/2020   History of UTI 05/19/2020   Hammer toe of left foot 06/22/2019   Allergy history, drug 04/10/2018   Leg pain 04/10/2018   Status post laparotomy 11/11/2015   Diabetes mellitus without complication (HCC) 10/18/2015   Hyperglycemia 03/09/2015   Advance care planning 02/07/2014   Hyperlipidemia 02/07/2014   Atypical pneumonia 09/30/2012   Vitamin D  deficiency 10/09/2008   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   Overweight 01/18/2007   Essential hypertension 01/18/2007   Allergic rhinitis 11/29/2006   FIBROCYSTIC BREAST DISEASE 11/29/2006    PCP: [name], MD  REFERRING PROVIDER: [name], MD  REFERRING DIAG:  M54.50 (ICD-10-CM) - Low back pain, unspecified back pain laterality, unspecified chronicity, unspecified whether sciatica present    RATIONALE FOR EVALUATION AND TREATMENT: Rehabilitation  THERAPY DIAG:   ONSET DATE: Chronic   FOLLOW-UP APPT SCHEDULED WITH REFERRING PROVIDER: No    SUBJECTIVE:                                                                                                                                                                                         SUBJECTIVE STATEMENT:    Patient reports to OPPT with acute on chronic lower back pain.   PERTINENT HISTORY:   Alexandria Fry is a 69 y.o female with chief concern of lower back pain. She has had previous bout of physical therapy for same diagnosis but had to take a break due other medical concerns. She is receiving radiation therapy every day for her current diagnosis. She reports that her lower back has worsened due to the position of the radiation chair and recent sedentary lifestyle. She reports that she's always fatigued and contributes it to the cancer treatments. She's also having  intermittent L sided hip pain. She has noticed that she has developed muscle weakness in her BLE.   She denies  bowel/bladder changes, saddle paresthesia, h/o abdominal aneurysm, abdominal pain, chills/fever, night sweats.    PAIN:    Pain Intensity: Present: 2/10, Best: 0/10, Worst: 7/10 Pain location: Lower Back Pain Quality: intermittent, sharp, and stabbing  Radiating: Yes  Numbness/Tingling: No Focal Weakness: No Aggravating factors: Prolonged walking, sitting, standing  Relieving factors: Positional Changes  History of prior back injury, pain, surgery, or therapy: Yes  PRECAUTIONS: Fall  WEIGHT BEARING RESTRICTIONS: No  FALLS: Has patient fallen in last 6 months? No  Living Environment Lives with: lives with their family Lives in: House/apartment Stairs: Yes: Internal: 8 steps; can reach both Has following equipment at home: None  Prior level of function: Independent  Occupational demands: Retired  Hobbies:Reading   Patient Goals: I want to walk longer and do more activities   OBJECTIVE:  Patient Surveys  moDI: TBD   Cognition WNL    Gross Musculoskeletal Assessment Tremor: None Bulk: Normal Tone: Normal No visible step-off along spinal column, no signs of scoliosis  GAIT: Distance walked: 20m  Assistive device utilized: Single point cane Level of assistance: Complete Independence Comments: Reciprocal gait, L hip pain.   Posture: Lumbar lordosis: WNL Iliac crest height: Equal bilaterally Lumbar lateral shift: Negative  AROM AROM (Normal range in degrees) AROM   Lumbar   Flexion (65) 50%  Extension (30) 25%  Right lateral flexion (25) 100%  Left lateral flexion (25) 100%  Right rotation (30)   Left rotation (30)       Hip Right Left  Flexion (125) WNL WNL  Extension (15)    Abduction (40)    Adduction     Internal Rotation (45)    External Rotation (45)        Knee    Flexion (135)    Extension (0)        Ankle    Dorsiflexion  (20)    Plantarflexion (50)    Inversion (35)    Eversion (15)    (* = pain; Blank rows = not tested)  LE MMT: MMT (out of 5) Right  Left   Hip flexion 4- 4-  Hip extension    Hip abduction (Seated) 4 4  Hip adduction 5 5  Hip internal rotation    Hip external rotation    Knee flexion 4- 4-  Knee extension 4- 4-  Ankle dorsiflexion 5 5  Ankle plantarflexion    Ankle inversion    Ankle eversion    (* = pain; Blank rows = not tested)  Sensation Grossly intact to light touch throughout bilateral LEs as determined by testing dermatomes L2-S2. Proprioception, stereognosis, and hot/cold testing deferred on this date.  Reflexes R/L Knee Jerk (L3/4): 2+/2+  Ankle Jerk (S1/2): 2+/2+   Muscle Length Hamstrings: R: Positive L: Positive  Palpation Location Right Left         Lumbar paraspinals 1 1  Quadratus Lumborum    Gluteus Maximus    Gluteus Medius    Deep hip external rotators    PSIS    Fortin's Area (SIJ)    Greater Trochanter    (Blank rows = not tested) Graded on 0-4 scale (0 = no pain, 1 = pain, 2 = pain with wincing/grimacing/flinching, 3 = pain with withdrawal, 4 = unwilling to allow palpation)  Passive Accessory  Motion Deferred   Special Tests Lumbar Radiculopathy and Discogenic:  SLR (SN 92, -LR 0.29): R: Positive L:  Negative Crossed SLR (SP 90): R: Negative L: Negative  Hip: FABER (  SN 81): R: Positive L: Negative FADIR (SN 94): R: Negative L: Negative  TODAY'S TREATMENT: DATE: 03/07/24  Subjective: Patient reports that she is recovering from a cold like infection. She reports that she followed up with Dr. Cleatus; no concordant pain with palpation along the side of the hip and lower back from the MD. Pt reports that her MD suspects that the symptoms are consistent with sciatica. No further questions or concerns.  Therapeutic Exercise:  Supine 90/90 with OH reach  2 x 10 - 2 Kg MB   Hooklying Marches with Anti Rotation   2 x 20 - Red TB    Seated Pallof Press  2 x 10 - Blue TB   Supine Piriformis Stretch   L: 1 min/bout x 3 in order to improve muscle tension/tissue extensibility  Supine Sciatic Nerve Glide - 90/90 with Knee Ext/Flex  R: 1 x 15 reps    Therapeutic Activity: NuStep L5-2 x 5 min x UE/LE (Seat 8) x pace partner mode > 100 spm for LE endurance and strength; PT manually adjusted resistance throughout per patient tolerance.   Sled Push   4 x 74m   Sit to Stand with Med [employer]   3 x 10 - 2 Kg MB  Side Stepping against resistance  4 x 12' - YTB around ankles  4 x 12' - Red TB around ankles   Transfer Sitting <> Supine Practice   4x   - Pt with minimal pain during transfer; mod cues for hand placement and using LE to help propel into sitting    PATIENT EDUCATION:  Education details: HEP, POC, Prognosis Person educated: Patient Education method: Explanation and Demonstration Education comprehension: verbalized understanding and returned demonstration   HOME EXERCISE PROGRAM:  Access Code: [email or web address] URL: [email or web address] Date: 02/03/2024 Prepared by: Lonni Rayman Petrosian  Exercises - Supine Posterior Pelvic Tilt  - 2 x daily - 7 x weekly - 3 sets - 10 reps - 5 hold - Supine Active Straight Leg Raise  - 1 x daily - 3-4 x weekly - 3 sets - 10-12 reps - Mini Squat with Counter Support  - 1 x daily - 3-4 x weekly - 3 sets - 10-12 reps - Supine 90/90 Shoulder Flexion with Abdominal Bracing  - 1 x daily - 3-4 x weekly - 2-3 sets - 10 reps - Supine 90/90 Abdominal Bracing  - 2 x daily - 3-4 x weekly - 5 sets - 15-30s hold - Supine 90/90 Overhead Dumbbell Raise  - 2 x daily - 3-4 x weekly - 2-3 sets - 10 reps   ASSESSMENT:  CLINICAL IMPRESSION: Patient returns to OPPT in management of lower back pain and R hip pain. Session focused on core stability and functional strengthening. She tolerated increased repetitions of sit to stands with med ball and minimal pain ( < 3/10). Passive  piriformis stretch and sciatic nerve glides performed in order to decreased sciatic nerve tension. Time spent educating patient on safer technique to transition from seated to supine in order to minimize lower back pain. Patient performed 30s STS and was able to achieve 10 reps with minimal pain. Patient below age related norms (11 reps) which should improve with additional strengthening over the course of her POC. PT plans to continue progressing exercises as tolerated. Based on her presentation pt will benefit from skilled physical therapy in order to address her current deficits and maximize return to PLOF.    OBJECTIVE IMPAIRMENTS: Abnormal gait, decreased activity tolerance, decreased  balance, decreased coordination, decreased endurance, difficulty walking, decreased ROM, decreased strength, impaired flexibility, postural dysfunction, and pain.   ACTIVITY LIMITATIONS: carrying, lifting, bending, sitting, standing, squatting, sleeping, stairs, and transfers  PARTICIPATION LIMITATIONS: shopping, community activity, and yard work  PERSONAL FACTORS: Age, Past/current experiences, Time since onset of injury/illness/exacerbation, and 1-2 comorbidities: 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), chronic LBP, HTN.  are also affecting patient's functional outcome.   REHAB POTENTIAL: Fair Hx of CA and chronicity of pain  CLINICAL DECISION MAKING: Evolving/moderate complexity  EVALUATION COMPLEXITY: Moderate   GOALS: Goals reviewed with patient? No  SHORT TERM GOALS: Target date: 02/22/2023  Pt will be independent with HEP in order to improve strength and decrease back pain to improve pain-free function at home and work. Baseline: 01/25/2024: Initial HEP Provided Goal status: INITIAL   LONG TERM GOALS: Target date: 03/21/2024   Pt will increase 30s Sit to Stand by at least 6 reps in order to demonstrate significant improvement in lower extremity strength and endurance.    Baseline: 01/25/2024: To Be Tested; 03/07/2024: 10 reps Goal status: INITIAL  2.  Pt will decrease worst back pain by at least 2 points on the NPRS in order to demonstrate clinically significant reduction in back pain. Baseline: 01/25/2024:07/10 Goal status: INITIAL  3.  Pt will decrease mODI score by at least 13 points in order demonstrate clinically significant reduction in back pain/disability.       Baseline: 01/25/2024: 10/50;  Goal status: INITIAL  4.  Pt will increase by at least 0.13 m/s in order to demonstrate clinically significant improvement in community ambulation.  Baseline: 01/25/2024: 1.00 m/s (9.92s - self selected) Goal status: INITIAL  5. Pt will increase Hip and Knee strength of by at least 1/2 MMT grade in order to demonstrate improvement in strength and function with walking and transfer.  Baseline: 01/25/2024:  MMT (out of 5) Right  Left   Hip flexion 4- 4-  Hip extension    Hip abduction (Seated) 4 4  Hip adduction 5 5  Hip internal rotation    Hip external rotation    Knee flexion 4- 4-  Knee extension 4- 4-   Goal status: INITIAL   PLAN: PT FREQUENCY: 2x/week   PT DURATION: 12 weeks   PLANNED INTERVENTIONS: Therapeutic exercises, Therapeutic activity, Neuromuscular re-education, Balance training, Gait training, Patient/Family education, Self Care, Joint mobilization, Joint manipulation, DME instructions, Dry Needling, Electrical stimulation, Spinal manipulation, Spinal mobilization, Cryotherapy, Moist heat, Manual therapy, and Re-evaluation.   PLAN FOR NEXT SESSION: Progress Hip strengthening, progress core strengthening, functional training (stairs, transfers)   Lonni Pall PT, DPT Physical Therapist- [place]  03/07/2024, 9:02 AM  "

## 2024-03-08 ENCOUNTER — Other Ambulatory Visit: Payer: Self-pay | Admitting: Family Medicine

## 2024-03-08 DIAGNOSIS — L659 Nonscarring hair loss, unspecified: Secondary | ICD-10-CM

## 2024-03-08 NOTE — Assessment & Plan Note (Signed)
History of.  See notes on labs. 

## 2024-03-08 NOTE — Assessment & Plan Note (Signed)
 Discussed options, sciatica. Reasonable to keep going with PT-I asked her to request for treatment for sciatica.  We can send a new order if needed. If not better, then let me know  Continue heat as needed. Anatomy discussed with patient. Tramadol  as needed for pain in the meantime.  Sedation caution.

## 2024-03-09 ENCOUNTER — Ambulatory Visit

## 2024-03-09 DIAGNOSIS — M5416 Radiculopathy, lumbar region: Secondary | ICD-10-CM

## 2024-03-09 DIAGNOSIS — M5459 Other low back pain: Secondary | ICD-10-CM

## 2024-03-09 DIAGNOSIS — M6281 Muscle weakness (generalized): Secondary | ICD-10-CM

## 2024-03-09 NOTE — Therapy (Signed)
 " OUTPATIENT PHYSICAL THERAPY THORACOLUMBAR TREATMENT   Patient Name: Alexandria Fry MRN: [id number] DOB:01-05-1956, 69 y.o., female Today's Date: 03/09/2024  END OF SESSION:  PT End of Session - 03/09/24 1605     Visit Number 7    Number of Visits 17    Date for Recertification  03/21/24    Progress Note Due on Visit 10    PT Start Time 1605    PT Stop Time 1645    PT Time Calculation (min) 40 min    Activity Tolerance Patient tolerated treatment well    Behavior During Therapy [facility] for tasks assessed/performed             Past Medical History:  Diagnosis Date   Allergic rhinitis    Allergy Age 27   Borax detergent...hives   Arthritis 11/22   Diabetes mellitus without complication (HCC)    Difficult intubation    a.) limited cervical ROM; b.) anterior anatomical airway   Fatty liver    GERD (gastroesophageal reflux disease)    Hirsutism    Hypertension    Irregular menses    with neg endometrial biopsy 2013   Menorrhagia    Murmur, cardiac    PCOS (polycystic ovarian syndrome)    Pneumonia 2014   PONV (postoperative nausea and vomiting)    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)    Thyroid  nodule    Past Surgical History:  Procedure Laterality Date   ABDOMINAL HYSTERECTOMY     AXILLARY SENTINEL NODE BIOPSY Left 11/11/2023   Procedure: BIOPSY, LYMPH NODE, SENTINEL, AXILLARY;  Surgeon: Jordis Laneta FALCON, MD;  Location: [facility];  Service: General;  Laterality: Left;   BREAST BIOPSY Left 10/28/2023   u/s bx heart clip path pending   BREAST BIOPSY Left 10/28/2023   US  LT BREAST BX W LOC DEV 1ST LESION IMG BX SPEC US  GUIDE 10/28/2023 [facility]   BREAST BIOPSY Left 11/09/2023   US  LT BREAST SAVI/RF TAG 1ST LESION US  GUIDE 11/09/2023 [facility]   BREAST LUMPECTOMY WITH RADIO FREQUENCY LOCALIZER Left 11/11/2023   Procedure: BREAST LUMPECTOMY WITH RADIO FREQUENCY LOCALIZER;  Surgeon: Jordis Laneta FALCON, MD;  Location: [facility];  Service:  General;  Laterality: Left;   CERVICAL CONE BIOPSY     COLONOSCOPY WITH PROPOFOL  N/A 01/18/2018   Procedure: COLONOSCOPY WITH PROPOFOL ;  Surgeon: [name], MD;  Location: [facility] ENDOSCOPY;  Service: Endoscopy;  Laterality: N/A;   COLONOSCOPY WITH PROPOFOL  N/A 02/25/2022   Procedure: COLONOSCOPY WITH PROPOFOL ;  Surgeon: [name], MD;  Location: [facility] ENDOSCOPY;  Service: Gastroenterology;  Laterality: N/A;   CYSTOSCOPY  11/11/2015   Procedure: CYSTOSCOPY;  Surgeon: [name], MD;  Location: [facility];  Service: Gynecology;;   DILATATION & CURETTAGE/HYSTEROSCOPY WITH MYOSURE N/A 03/21/2015   Procedure: DILATATION & CURETTAGE/HYSTEROSCOPY, POLYPECTOMY;  Surgeon: [name], MD;  Location: [facility];  Service: Gynecology;  Laterality: N/A;   DILATION AND CURETTAGE OF UTERUS  1981   [facility]    DILATION AND CURETTAGE OF UTERUS  06/25/2009   normal (Dr. [name])   LAPAROSCOPIC BILATERAL SALPINGECTOMY Bilateral 11/11/2015   Procedure: LAPAROSCOPIC BILATERAL SALPINGECTOMY;  Surgeon: [name], MD;  Location: [facility];  Service: Gynecology;  Laterality: Bilateral;   LAPAROSCOPIC SUPRACERVICAL HYSTERECTOMY  11/11/2015   Procedure: LAPAROSCOPIC SUPRACERVICAL HYSTERECTOMY CONVERTED TO OPEN;  Surgeon: [name], MD;  Location: [facility];  Service: Gynecology;;   LAPAROTOMY  11/11/2015   Procedure: LAPAROTOMY;  Surgeon: Mitzie  C Ward, MD;  Location: [facility];  Service: Gynecology;;   VAGINAL DELIVERY     NSVD x 2   Patient Active Problem List   Diagnosis Date Noted   Elevated serum creatinine 12/12/2023   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) 11/06/2023   Decreased range of motion of shoulder, left 10/10/2023   Right hip pain 06/13/2023   Healthcare maintenance 02/21/2023   Statin intolerance 02/21/2023   Rash 02/21/2023   Dysuria 02/21/2023   Family history of colorectal cancer 02/25/2022   Adenomatous polyp of colon 02/25/2022   Medicare welcome  exam 12/28/2021   Lung nodule 05/19/2021   Respiratory failure with hypoxia (HCC) 05/06/2021   AKI (acute kidney injury) 05/06/2021   Hyperandrogenemia 05/27/2020   Multinodular goiter 05/27/2020   History of UTI 05/19/2020   Hammer toe of left foot 06/22/2019   Allergy history, drug 04/10/2018   Leg pain 04/10/2018   Status post laparotomy 11/11/2015   Diabetes mellitus without complication (HCC) 10/18/2015   Hyperglycemia 03/09/2015   Advance care planning 02/07/2014   Hyperlipidemia 02/07/2014   Atypical pneumonia 09/30/2012   Vitamin D  deficiency 10/09/2008   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   Overweight 01/18/2007   Essential hypertension 01/18/2007   Allergic rhinitis 11/29/2006   FIBROCYSTIC BREAST DISEASE 11/29/2006    PCP: [name], MD  REFERRING PROVIDER: [name], MD  REFERRING DIAG:  M54.50 (ICD-10-CM) - Low back pain, unspecified back pain laterality, unspecified chronicity, unspecified whether sciatica present    RATIONALE FOR EVALUATION AND TREATMENT: Rehabilitation  THERAPY DIAG:   ONSET DATE: Chronic   FOLLOW-UP APPT SCHEDULED WITH REFERRING PROVIDER: No    SUBJECTIVE:                                                                                                                                                                                         SUBJECTIVE STATEMENT:    Patient reports to OPPT with acute on chronic lower back pain.   PERTINENT HISTORY:   Alexandria Fry is a 69 y.o female with chief concern of lower back pain. She has had previous bout of physical therapy for same diagnosis but had to take a break due other medical concerns. She is receiving radiation therapy every day for her current diagnosis. She reports that her lower back has worsened due to the position of the radiation chair and recent sedentary lifestyle. She reports that she's always fatigued and contributes it to the cancer treatments. She's also having  intermittent L sided hip pain. She has noticed that she has developed muscle weakness in her BLE.   She denies  bowel/bladder changes, saddle paresthesia, h/o abdominal aneurysm, abdominal pain, chills/fever, night sweats.    PAIN:    Pain Intensity: Present: 2/10, Best: 0/10, Worst: 7/10 Pain location: Lower Back Pain Quality: intermittent, sharp, and stabbing  Radiating: Yes  Numbness/Tingling: No Focal Weakness: No Aggravating factors: Prolonged walking, sitting, standing  Relieving factors: Positional Changes  History of prior back injury, pain, surgery, or therapy: Yes  PRECAUTIONS: Fall  WEIGHT BEARING RESTRICTIONS: No  FALLS: Has patient fallen in last 6 months? No  Living Environment Lives with: lives with their family Lives in: House/apartment Stairs: Yes: Internal: 8 steps; can reach both Has following equipment at home: None  Prior level of function: Independent  Occupational demands: Retired  Hobbies:Reading   Patient Goals: I want to walk longer and do more activities   OBJECTIVE:  Patient Surveys  moDI: TBD   Cognition WNL    Gross Musculoskeletal Assessment Tremor: None Bulk: Normal Tone: Normal No visible step-off along spinal column, no signs of scoliosis  GAIT: Distance walked: 40m  Assistive device utilized: Single point cane Level of assistance: Complete Independence Comments: Reciprocal gait, L hip pain.   Posture: Lumbar lordosis: WNL Iliac crest height: Equal bilaterally Lumbar lateral shift: Negative  AROM AROM (Normal range in degrees) AROM   Lumbar   Flexion (65) 50%  Extension (30) 25%  Right lateral flexion (25) 100%  Left lateral flexion (25) 100%  Right rotation (30)   Left rotation (30)       Hip Right Left  Flexion (125) WNL WNL  Extension (15)    Abduction (40)    Adduction     Internal Rotation (45)    External Rotation (45)        Knee    Flexion (135)    Extension (0)        Ankle    Dorsiflexion  (20)    Plantarflexion (50)    Inversion (35)    Eversion (15)    (* = pain; Blank rows = not tested)  LE MMT: MMT (out of 5) Right  Left   Hip flexion 4- 4-  Hip extension    Hip abduction (Seated) 4 4  Hip adduction 5 5  Hip internal rotation    Hip external rotation    Knee flexion 4- 4-  Knee extension 4- 4-  Ankle dorsiflexion 5 5  Ankle plantarflexion    Ankle inversion    Ankle eversion    (* = pain; Blank rows = not tested)  Sensation Grossly intact to light touch throughout bilateral LEs as determined by testing dermatomes L2-S2. Proprioception, stereognosis, and hot/cold testing deferred on this date.  Reflexes R/L Knee Jerk (L3/4): 2+/2+  Ankle Jerk (S1/2): 2+/2+   Muscle Length Hamstrings: R: Positive L: Positive  Palpation Location Right Left         Lumbar paraspinals 1 1  Quadratus Lumborum    Gluteus Maximus    Gluteus Medius    Deep hip external rotators    PSIS    Fortin's Area (SIJ)    Greater Trochanter    (Blank rows = not tested) Graded on 0-4 scale (0 = no pain, 1 = pain, 2 = pain with wincing/grimacing/flinching, 3 = pain with withdrawal, 4 = unwilling to allow palpation)  Passive Accessory  Motion Deferred   Special Tests Lumbar Radiculopathy and Discogenic:  SLR (SN 92, -LR 0.29): R: Positive L:  Negative Crossed SLR (SP 90): R: Negative L: Negative  Hip: FABER (  SN 81): R: Positive L: Negative FADIR (SN 94): R: Negative L: Negative  TODAY'S TREATMENT: DATE: 03/09/24  Subjective: Patient reports that she was able to perform bed mobility and transition from the couch without pain. Patient reports minimal pain in her lower back. Only certain positions and movements (torso rotation) cause lower back pain. No further questions or concerns.  Therapeutic Exercise:  Supine 90/90 OH reach with alternating marches   2 x 20 - alternating marches  Supine 90/90 iso hold with anti rotation   3 x 15s hold - against green TB  Supine  90/90 with alternating LE Extension and antirotation (green TB)  2 x 10 alt LE  Seated Pallof Press  2 x 10 - Blue TB   Supine Piriformis Stretch   L: 1 min/bout x 3 in order to improve muscle tension/tissue extensibility  Therapeutic Activity: NuStep L5-2 x 5 min x UE/LE (Seat 8) x pace partner mode > 100 spm for LE endurance and strength; PT manually adjusted resistance throughout per patient tolerance.   Resisted  Sit to Stand from NuStep   3 x 10 - 3 Kg MB  Side Stepping against resistance  4 x 12' - Red TB around ankles   4 x 12' - Red TB around ankles  Partial Squats - Red TB   2 x 10 - TB around lower leg for increased gluteal activation   PATIENT EDUCATION:  Education details: Exercise Technique  Person educated: Patient Education method: Explanation and Demonstration Education comprehension: verbalized understanding and returned demonstration   HOME EXERCISE PROGRAM:  Access Code: [email or web address] URL: [email or web address] Date: 02/03/2024 Prepared by: Lonni Emalea Mix  Exercises - Supine Posterior Pelvic Tilt  - 2 x daily - 7 x weekly - 3 sets - 10 reps - 5 hold - Supine Active Straight Leg Raise  - 1 x daily - 3-4 x weekly - 3 sets - 10-12 reps - Mini Squat with Counter Support  - 1 x daily - 3-4 x weekly - 3 sets - 10-12 reps - Supine 90/90 Shoulder Flexion with Abdominal Bracing  - 1 x daily - 3-4 x weekly - 2-3 sets - 10 reps - Supine 90/90 Abdominal Bracing  - 2 x daily - 3-4 x weekly - 5 sets - 15-30s hold - Supine 90/90 Overhead Dumbbell Raise  - 2 x daily - 3-4 x weekly - 2-3 sets - 10 reps   ASSESSMENT:  CLINICAL IMPRESSION: Patient returns to OPPT in management of lower back pain and R hip pain. Session focused on core stability and functional strengthening. STS at a increased seat height in order to decrease reported tail bone pain. She was able to tolerate limb excursions with core stability exercises and no additional report of lower back  pain.  Continued focus on gluteal strengthening in  order to provide increased lumbopelvic stability. Patient still presenting with decreased activity tolerance due to recent deconditioning from radiation therapy for CA diagnosis. PT will continue improving activity tolerance within patient's pain tolerance. Her deficits in activity tolerance, LE strength and core stability prevent her from full participation with recreational activities and basic ADLs. Based on her presentation pt will benefit from skilled physical therapy in order to address her current deficits and maximize return to PLOF.      OBJECTIVE IMPAIRMENTS: Abnormal gait, decreased activity tolerance, decreased balance, decreased coordination, decreased endurance, difficulty walking, decreased ROM, decreased strength, impaired flexibility, postural dysfunction, and pain.   ACTIVITY LIMITATIONS: carrying, lifting, bending, sitting, standing, squatting,  sleeping, stairs, and transfers  PARTICIPATION LIMITATIONS: shopping, community activity, and yard work  PERSONAL FACTORS: Age, Past/current experiences, Time since onset of injury/illness/exacerbation, and 1-2 comorbidities: 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), chronic LBP, HTN.  are also affecting patient's functional outcome.   REHAB POTENTIAL: Fair Hx of CA and chronicity of pain  CLINICAL DECISION MAKING: Evolving/moderate complexity  EVALUATION COMPLEXITY: Moderate   GOALS: Goals reviewed with patient? No  SHORT TERM GOALS: Target date: 02/22/2023  Pt will be independent with HEP in order to improve strength and decrease back pain to improve pain-free function at home and work. Baseline: 01/25/2024: Initial HEP Provided Goal status: INITIAL   LONG TERM GOALS: Target date: 03/21/2024   Pt will increase 30s Sit to Stand by at least 6 reps in order to demonstrate significant improvement in lower extremity strength and endurance.   Baseline:  01/25/2024: To Be Tested; 03/07/2024: 10 reps Goal status: INITIAL  2.  Pt will decrease worst back pain by at least 2 points on the NPRS in order to demonstrate clinically significant reduction in back pain. Baseline: 01/25/2024:07/10 Goal status: INITIAL  3.  Pt will decrease mODI score by at least 13 points in order demonstrate clinically significant reduction in back pain/disability.       Baseline: 01/25/2024: 10/50;  Goal status: INITIAL  4.  Pt will increase by at least 0.13 m/s in order to demonstrate clinically significant improvement in community ambulation.  Baseline: 01/25/2024: 1.00 m/s (9.92s - self selected) Goal status: INITIAL  5. Pt will increase Hip and Knee strength of by at least 1/2 MMT grade in order to demonstrate improvement in strength and function with walking and transfer.  Baseline: 01/25/2024:  MMT (out of 5) Right  Left   Hip flexion 4- 4-  Hip extension    Hip abduction (Seated) 4 4  Hip adduction 5 5  Hip internal rotation    Hip external rotation    Knee flexion 4- 4-  Knee extension 4- 4-   Goal status: INITIAL   PLAN: PT FREQUENCY: 2x/week   PT DURATION: 12 weeks   PLANNED INTERVENTIONS: Therapeutic exercises, Therapeutic activity, Neuromuscular re-education, Balance training, Gait training, Patient/Family education, Self Care, Joint mobilization, Joint manipulation, DME instructions, Dry Needling, Electrical stimulation, Spinal manipulation, Spinal mobilization, Cryotherapy, Moist heat, Manual therapy, and Re-evaluation.   PLAN FOR NEXT SESSION: Progress Hip strengthening, progress core strengthening, functional training (stairs, transfers)   Lonni Pall PT, DPT Physical Therapist- [place]  03/09/2024, 4:06 PM  "

## 2024-03-10 ENCOUNTER — Ambulatory Visit: Payer: Self-pay | Admitting: Family Medicine

## 2024-03-13 ENCOUNTER — Ambulatory Visit

## 2024-03-15 ENCOUNTER — Ambulatory Visit: Payer: Medicare (Managed Care) | Admitting: Surgery

## 2024-03-15 ENCOUNTER — Encounter: Payer: Self-pay | Admitting: Surgery

## 2024-03-15 VITALS — BP 148/86 | HR 89 | Ht 65.0 in | Wt 202.0 lb

## 2024-03-15 DIAGNOSIS — C50412 Malignant neoplasm of upper-outer quadrant of left female breast: Secondary | ICD-10-CM | POA: Diagnosis not present

## 2024-03-15 DIAGNOSIS — Z08 Encounter for follow-up examination after completed treatment for malignant neoplasm: Secondary | ICD-10-CM | POA: Diagnosis not present

## 2024-03-15 NOTE — Patient Instructions (Signed)
 Please give our office a call if you have any questions or concerns  We also placed you in our recall system and will send you out a letter for your appointment with Dr Jordis and to have your mammogram

## 2024-03-15 NOTE — Progress Notes (Signed)
 Outpatient Surgical Follow Up  03/15/2024  Alexandria Fry is an 69 y.o. female.   Chief Complaint  Patient presents with   Follow-up    HPI:  69 yo very pleasant female s/p left lumpectomy SLNB 10/2023 micromets in 1 node, negative margins.  Stage IA (cT1b, cN0(f), cM0, G2, ER+, PR+, HER2-)  She is doing ok, she completed adjuvant radiation therapy tolerated well but did develop skin changes and erythema along the left breast and chest wall She is doing well.  Endorses no fevers no chills taking hormonal therapy.  She comes accompanied by her husband. No breast complaints today   Past Medical History:  Diagnosis Date   Allergic rhinitis    Allergy Age 56   Borax detergent...hives   Arthritis 11/22   Diabetes mellitus without complication (HCC)    Difficult intubation    a.) limited cervical ROM; b.) anterior anatomical airway   Fatty liver    GERD (gastroesophageal reflux disease)    Hirsutism    Hypertension    Irregular menses    with neg endometrial biopsy 2013   Menorrhagia    Murmur, cardiac    PCOS (polycystic ovarian syndrome)    Pneumonia 2014   PONV (postoperative nausea and vomiting)    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)    Thyroid  nodule     Past Surgical History:  Procedure Laterality Date   ABDOMINAL HYSTERECTOMY     AXILLARY SENTINEL NODE BIOPSY Left 11/11/2023   Procedure: BIOPSY, LYMPH NODE, SENTINEL, AXILLARY;  Surgeon: Jordis Laneta FALCON, MD;  Location: [facility];  Service: General;  Laterality: Left;   BREAST BIOPSY Left 10/28/2023   u/s bx heart clip path pending   BREAST BIOPSY Left 10/28/2023   US  LT BREAST BX W LOC DEV 1ST LESION IMG BX SPEC US  GUIDE 10/28/2023 [facility]   BREAST BIOPSY Left 11/09/2023   US  LT BREAST SAVI/RF TAG 1ST LESION US  GUIDE 11/09/2023 [facility]   BREAST LUMPECTOMY WITH RADIO FREQUENCY LOCALIZER Left 11/11/2023   Procedure: BREAST LUMPECTOMY WITH RADIO FREQUENCY LOCALIZER;  Surgeon: Jordis Laneta FALCON, MD;  Location: [facility];  Service: General;  Laterality: Left;   CERVICAL CONE BIOPSY     COLONOSCOPY WITH PROPOFOL  N/A 01/18/2018   Procedure: COLONOSCOPY WITH PROPOFOL ;  Surgeon: [name], MD;  Location: [facility] ENDOSCOPY;  Service: Endoscopy;  Laterality: N/A;   COLONOSCOPY WITH PROPOFOL  N/A 02/25/2022   Procedure: COLONOSCOPY WITH PROPOFOL ;  Surgeon: [name], MD;  Location: [facility] ENDOSCOPY;  Service: Gastroenterology;  Laterality: N/A;   CYSTOSCOPY  11/11/2015   Procedure: CYSTOSCOPY;  Surgeon: [name], MD;  Location: [facility];  Service: Gynecology;;   DILATATION & CURETTAGE/HYSTEROSCOPY WITH MYOSURE N/A 03/21/2015   Procedure: DILATATION & CURETTAGE/HYSTEROSCOPY, POLYPECTOMY;  Surgeon: [name], MD;  Location: [facility];  Service: Gynecology;  Laterality: N/A;   DILATION AND CURETTAGE OF UTERUS  1981   [facility]    DILATION AND CURETTAGE OF UTERUS  06/25/2009   normal (Dr. [name])   LAPAROSCOPIC BILATERAL SALPINGECTOMY Bilateral 11/11/2015   Procedure: LAPAROSCOPIC BILATERAL SALPINGECTOMY;  Surgeon: [name], MD;  Location: [facility];  Service: Gynecology;  Laterality: Bilateral;   LAPAROSCOPIC SUPRACERVICAL HYSTERECTOMY  11/11/2015   Procedure: LAPAROSCOPIC SUPRACERVICAL HYSTERECTOMY CONVERTED TO OPEN;  Surgeon: [name], MD;  Location: [facility];  Service: Gynecology;;   LAPAROTOMY  11/11/2015   Procedure: LAPAROTOMY;  Surgeon: [name], MD;  Location: [facility];  Service:  Gynecology;;   VAGINAL DELIVERY     NSVD x 2    Family History  Problem Relation Age of Onset   Diabetes Mother    Hypertension Mother    Colon cancer Mother 40   Breast cancer Mother 55   Osteoporosis Mother    Arthritis Mother    Cancer Mother 4   Kidney disease Mother    Obesity Mother    Pancreatic cancer Father 17   Colon polyps Father    Alcohol abuse Father    Heart disease Father    Cancer Father    Early death Sister 60       suicide   Anxiety  disorder Sister    Diabetes Brother    Hypertension Brother    Kidney disease Brother    Obesity Brother    ADD / ADHD Son    ADD / ADHD Son    Thyroid  disease Neg Hx     Social History:  reports that she quit smoking about 42 years ago. Her smoking use included cigarettes. She started smoking about 52 years ago. She has a 10 pack-year smoking history. She has been exposed to tobacco smoke. She has never used smokeless tobacco. She reports current alcohol use. She reports that she does not use drugs.  Allergies: Allergies[1]  Medications reviewed.    ROS Full ROS performed and is otherwise negative other than what is stated in HPI   BP (!) 148/86   Pulse 89   Ht 5' 5 (1.651 m)   Wt 202 lb (91.6 kg)   LMP  (LMP Unknown)   SpO2 98%   BMI 33.61 kg/m        Physical Exam Vitals and nursing note reviewed. Exam conducted with a chaperone present.  Constitutional:      Appearance: Normal appearance. She is not ill-appearing.  Neck:     Vascular: No carotid bruit.  Cardiovascular:     Rate and Rhythm: Normal rate and regular rhythm.     Heart sounds: No murmur heard. Pulmonary:     Effort: Pulmonary effort is normal.     Breath sounds: Normal breath sounds. No stridor.     Comments:   Breast exam: There is evidence of prior lumpectomy on the left side there is no evidence of surgical complications no evidence of expanding hematomas or infection.  She does have significant radiation changes to the skin of the left breast as well as the breast parenchyma and nipple areolar complexes.  There is no evidence of palpable breast masses on either side.  There is no evidence of axillary adenopathy. Abdominal:     General: Abdomen is flat. There is no distension.     Palpations: Abdomen is soft. There is no mass.     Tenderness: There is no abdominal tenderness. There is no guarding or rebound.     Hernia: No hernia is present.  Musculoskeletal:        General: No swelling or  tenderness. Normal range of motion.     Cervical back: Normal range of motion and neck supple. No rigidity or tenderness.  Lymphadenopathy:     Cervical: No cervical adenopathy.  Skin:    General: Skin is warm and dry.     Capillary Refill: Capillary refill takes less than 2 seconds.  Neurological:     General: No focal deficit present.     Mental Status: She is alert and oriented to person, place, and time.  Psychiatric:  Mood and Affect: Mood normal.        Behavior: Behavior normal.        Thought Content: Thought content normal.        Judgment: Judgment normal.      Assessment/Plan: Donny is a 69 year old very pleasant woman with left breast cancer status post lumpectomy and radiation therapy doing well without clinical evidence of recurrence.  There is no evidence of complications.  Will continue to follow her once she completes her mammogram in the fall.  At this time no need for surgical intervention or further diagnostic testing. I personally spent a total of 30 minutes in the care of the patient today including performing a medically appropriate exam/evaluation, counseling and educating, placing orders, referring and communicating with other health care professionals, documenting clinical information in the EHR, independently interpreting and reviewing images studies and coordinating care.   Laneta Luna, MD FACS General Surgeon     [1]  Allergies Allergen Reactions   Amlodipine  Other (See Comments) and Swelling    edema   Amoxicillin  Anaphylaxis   Azithromycin Rash   Hydrochlorothiazide Other (See Comments)    H/o sulfa allergy   Penicillins Other (See Comments)    Tongue swells    Pravachol  [Pravastatin ] Diarrhea and Nausea And Vomiting    Tongue swelling   Sulfa Antibiotics Other (See Comments)    Tongue swells   Ace Inhibitors Other (See Comments)    Would avoid.  She has h/o lip swelling with other meds.    Albuterol      Pulse elevation.    Angiotensin Receptor Blockers Other (See Comments)    Would avoid.  She has h/o lip swelling with other meds.    Clonidine  And Derivatives Other (See Comments)    bradycardia   Diphenhydramine Hcl     Benadryl cream causes local skin irritation.  Would avoid med.  She can tolerate zyrtec.    Fentanyl  Other (See Comments)    She felt diffusely awful while on med   Hydrochlorothiazide-Triamterene     Possible hives and GI upset   Hydrocodone     intolerant   Metformin And Related     rash   Metoprolol      Bradycardia.   Nitrofurantoin Other (See Comments)    rash   Oxycodone Other (See Comments)    intolerant   Tetanus Toxoid-Containing Vaccines     Local swelling, can consider split dosing 2 weeks apart   Cefuroxime Axetil Rash    Rash   Levofloxacin In D5w Itching    Severe itching and flushing.   Tetanus Toxoid, Adsorbed Rash

## 2024-03-16 ENCOUNTER — Ambulatory Visit

## 2024-03-16 DIAGNOSIS — M5459 Other low back pain: Secondary | ICD-10-CM | POA: Diagnosis not present

## 2024-03-16 DIAGNOSIS — M6281 Muscle weakness (generalized): Secondary | ICD-10-CM

## 2024-03-16 DIAGNOSIS — M5416 Radiculopathy, lumbar region: Secondary | ICD-10-CM

## 2024-03-16 NOTE — Therapy (Signed)
 " OUTPATIENT PHYSICAL THERAPY THORACOLUMBAR TREATMENT   Patient Name: Alexandria Fry MRN: [id number] DOB:Jul 04, 1955, 69 y.o., female Today's Date: 03/16/2024  END OF SESSION:  PT End of Session - 03/16/24 1608     Visit Number 8    Number of Visits 17    Date for Recertification  03/21/24    Progress Note Due on Visit 10    PT Start Time 1605    PT Stop Time 1645    PT Time Calculation (min) 40 min    Activity Tolerance Patient tolerated treatment well    Behavior During Therapy [facility] for tasks assessed/performed              Past Medical History:  Diagnosis Date   Allergic rhinitis    Allergy Age 59   Borax detergent...hives   Arthritis 11/22   Diabetes mellitus without complication (HCC)    Difficult intubation    a.) limited cervical ROM; b.) anterior anatomical airway   Fatty liver    GERD (gastroesophageal reflux disease)    Hirsutism    Hypertension    Irregular menses    with neg endometrial biopsy 2013   Menorrhagia    Murmur, cardiac    PCOS (polycystic ovarian syndrome)    Pneumonia 2014   PONV (postoperative nausea and vomiting)    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)    Thyroid  nodule    Past Surgical History:  Procedure Laterality Date   ABDOMINAL HYSTERECTOMY     AXILLARY SENTINEL NODE BIOPSY Left 11/11/2023   Procedure: BIOPSY, LYMPH NODE, SENTINEL, AXILLARY;  Surgeon: Jordis Laneta FALCON, MD;  Location: [facility];  Service: General;  Laterality: Left;   BREAST BIOPSY Left 10/28/2023   u/s bx heart clip path pending   BREAST BIOPSY Left 10/28/2023   US  LT BREAST BX W LOC DEV 1ST LESION IMG BX SPEC US  GUIDE 10/28/2023 [facility]   BREAST BIOPSY Left 11/09/2023   US  LT BREAST SAVI/RF TAG 1ST LESION US  GUIDE 11/09/2023 [facility]   BREAST LUMPECTOMY WITH RADIO FREQUENCY LOCALIZER Left 11/11/2023   Procedure: BREAST LUMPECTOMY WITH RADIO FREQUENCY LOCALIZER;  Surgeon: Jordis Laneta FALCON, MD;  Location: [facility];  Service:  General;  Laterality: Left;   CERVICAL CONE BIOPSY     COLONOSCOPY WITH PROPOFOL  N/A 01/18/2018   Procedure: COLONOSCOPY WITH PROPOFOL ;  Surgeon: [name], MD;  Location: [facility] ENDOSCOPY;  Service: Endoscopy;  Laterality: N/A;   COLONOSCOPY WITH PROPOFOL  N/A 02/25/2022   Procedure: COLONOSCOPY WITH PROPOFOL ;  Surgeon: [name], MD;  Location: [facility] END[facility];  Service: Gastroenterology;  Laterality: N/A;   CYSTOSCOPY  11/11/2015   Procedure: CYSTOSCOPY;  Surgeon: [name], MD;  Location: [facility];  Service: Gynecology;;   DILATATION & CURETTAGE/HYSTEROSCOPY WITH MYOSURE N/A 03/21/2015   Procedure: DILATATION & CURETTAGE/HYSTEROSCOPY, POLYPECTOMY;  Surgeon: [name], MD;  Location: [facility];  Service: Gynecology;  Laterality: N/A;   DILATION AND CURETTAGE OF UTERUS  1981   [facility]    DILATION AND CURETTAGE OF UTERUS  06/25/2009   normal (Dr. [name])   LAPAROSCOPIC BILATERAL SALPINGECTOMY Bilateral 11/11/2015   Procedure: LAPAROSCOPIC BILATERAL SALPINGECTOMY;  Surgeon: [name], MD;  Location: [facility];  Service: Gynecology;  Laterality: Bilateral;   LAPAROSCOPIC SUPRACERVICAL HYSTERECTOMY  11/11/2015   Procedure: LAPAROSCOPIC SUPRACERVICAL HYSTERECTOMY CONVERTED TO OPEN;  Surgeon: [name], MD;  Location: [facility];  Service: Gynecology;;   LAPAROTOMY  11/11/2015   Procedure: LAPAROTOMY;  Surgeon:  [name], MD;  Location: [facility];  Service: Gynecology;;   VAGINAL DELIVERY     NSVD x 2   Patient Active Problem List   Diagnosis Date Noted   Elevated serum creatinine 12/12/2023   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) 11/06/2023   Decreased range of motion of shoulder, left 10/10/2023   Right hip pain 06/13/2023   Healthcare maintenance 02/21/2023   Statin intolerance 02/21/2023   Rash 02/21/2023   Dysuria 02/21/2023   Family history of colorectal cancer 02/25/2022   Adenomatous polyp of colon 02/25/2022   Medicare welcome  exam 12/28/2021   Lung nodule 05/19/2021   Respiratory failure with hypoxia (HCC) 05/06/2021   AKI (acute kidney injury) 05/06/2021   Hyperandrogenemia 05/27/2020   Multinodular goiter 05/27/2020   History of UTI 05/19/2020   Hammer toe of left foot 06/22/2019   Allergy history, drug 04/10/2018   Leg pain 04/10/2018   Status post laparotomy 11/11/2015   Diabetes mellitus without complication (HCC) 10/18/2015   Hyperglycemia 03/09/2015   Advance care planning 02/07/2014   Hyperlipidemia 02/07/2014   Atypical pneumonia 09/30/2012   Vitamin D  deficiency 10/09/2008   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   Overweight 01/18/2007   Essential hypertension 01/18/2007   Allergic rhinitis 11/29/2006   FIBROCYSTIC BREAST DISEASE 11/29/2006    PCP: [name], MD  REFERRING PROVIDER: [name], MD  REFERRING DIAG:  M54.50 (ICD-10-CM) - Low back pain, unspecified back pain laterality, unspecified chronicity, unspecified whether sciatica present    RATIONALE FOR EVALUATION AND TREATMENT: Rehabilitation  THERAPY DIAG:   ONSET DATE: Chronic   FOLLOW-UP APPT SCHEDULED WITH REFERRING PROVIDER: No    SUBJECTIVE:                                                                                                                                                                                         SUBJECTIVE STATEMENT:    Patient reports to OPPT with acute on chronic lower back pain.   PERTINENT HISTORY:   Alexandria Fry is a 69 y.o female with chief concern of lower back pain. She has had previous bout of physical therapy for same diagnosis but had to take a break due other medical concerns. She is receiving radiation therapy every day for her current diagnosis. She reports that her lower back has worsened due to the position of the radiation chair and recent sedentary lifestyle. She reports that she's always fatigued and contributes it to the cancer treatments. She's also having  intermittent L sided hip pain. She has noticed that she has developed muscle weakness in her BLE.   She  denies bowel/bladder changes, saddle paresthesia, h/o abdominal aneurysm, abdominal pain, chills/fever, night sweats.    PAIN:    Pain Intensity: Present: 2/10, Best: 0/10, Worst: 7/10 Pain location: Lower Back Pain Quality: intermittent, sharp, and stabbing  Radiating: Yes  Numbness/Tingling: No Focal Weakness: No Aggravating factors: Prolonged walking, sitting, standing  Relieving factors: Positional Changes  History of prior back injury, pain, surgery, or therapy: Yes  PRECAUTIONS: Fall  WEIGHT BEARING RESTRICTIONS: No  FALLS: Has patient fallen in last 6 months? No  Living Environment Lives with: lives with their family Lives in: House/apartment Stairs: Yes: Internal: 8 steps; can reach both Has following equipment at home: None  Prior level of function: Independent  Occupational demands: Retired  Hobbies:Reading   Patient Goals: I want to walk longer and do more activities   OBJECTIVE:  Patient Surveys  moDI: TBD   Cognition WNL    Gross Musculoskeletal Assessment Tremor: None Bulk: Normal Tone: Normal No visible step-off along spinal column, no signs of scoliosis  GAIT: Distance walked: 76m  Assistive device utilized: Single point cane Level of assistance: Complete Independence Comments: Reciprocal gait, L hip pain.   Posture: Lumbar lordosis: WNL Iliac crest height: Equal bilaterally Lumbar lateral shift: Negative  AROM AROM (Normal range in degrees) AROM   Lumbar   Flexion (65) 50%  Extension (30) 25%  Right lateral flexion (25) 100%  Left lateral flexion (25) 100%  Right rotation (30)   Left rotation (30)       Hip Right Left  Flexion (125) WNL WNL  Extension (15)    Abduction (40)    Adduction     Internal Rotation (45)    External Rotation (45)        Knee    Flexion (135)    Extension (0)        Ankle    Dorsiflexion  (20)    Plantarflexion (50)    Inversion (35)    Eversion (15)    (* = pain; Blank rows = not tested)  LE MMT: MMT (out of 5) Right  Left   Hip flexion 4- 4-  Hip extension    Hip abduction (Seated) 4 4  Hip adduction 5 5  Hip internal rotation    Hip external rotation    Knee flexion 4- 4-  Knee extension 4- 4-  Ankle dorsiflexion 5 5  Ankle plantarflexion    Ankle inversion    Ankle eversion    (* = pain; Blank rows = not tested)  Sensation Grossly intact to light touch throughout bilateral LEs as determined by testing dermatomes L2-S2. Proprioception, stereognosis, and hot/cold testing deferred on this date.  Reflexes R/L Knee Jerk (L3/4): 2+/2+  Ankle Jerk (S1/2): 2+/2+   Muscle Length Hamstrings: R: Positive L: Positive  Palpation Location Right Left         Lumbar paraspinals 1 1  Quadratus Lumborum    Gluteus Maximus    Gluteus Medius    Deep hip external rotators    PSIS    Fortin's Area (SIJ)    Greater Trochanter    (Blank rows = not tested) Graded on 0-4 scale (0 = no pain, 1 = pain, 2 = pain with wincing/grimacing/flinching, 3 = pain with withdrawal, 4 = unwilling to allow palpation)  Passive Accessory  Motion Deferred   Special Tests Lumbar Radiculopathy and Discogenic:  SLR (SN 92, -LR 0.29): R: Positive L:  Negative Crossed SLR (SP 90): R: Negative L: Negative  Hip:  FABER (SN 81): R: Positive L: Negative FADIR (SN 94): R: Negative L: Negative  TODAY'S TREATMENT: DATE: 03/16/24  Subjective: Patient reports 0/10 in the lower back but she has a twinge in her R hip/gluteal muscle.   No further questions or concerns.  Therapeutic Exercise:  Standing Hip Abduction (cable)   R/L: 3 x 10 - 25#   Dead Bug - alternating UE/LE   2 x 10 reps   Supine 90/90 with OH reach   3 x 10 - 3 Kg med ball  Supine 90/90 with Medball OH reach to alternating knees   2 x 10 - 3 Kg MB  Sidelying Clamshell   R: 1 x 10 - Red TB   R: 1 x 10 - Blue  TB   Seated Horizontal Torso  Rotation   R/L: 1 x 10 - Blue TB ea dir  Therapeutic Activity: NuStep L5-2 x 6 min x UE/LE (Seat 8) x pace partner mode > 100 spm for LE endurance and strength; PT manually adjusted resistance throughout per patient tolerance.   Partial Squats - Red TB around lower leg   3 x 10 - 3 Kg MB   PATIENT EDUCATION:  Education details: Exercise Technique  Person educated: Patient Education method: Explanation and Demonstration Education comprehension: verbalized understanding and returned demonstration   HOME EXERCISE PROGRAM:  Access Code: [email or web address] URL: [email or web address] Date: 02/03/2024 Prepared by: Lonni Lesieli Bresee  Exercises - Supine Posterior Pelvic Tilt  - 2 x daily - 7 x weekly - 3 sets - 10 reps - 5 hold - Supine Active Straight Leg Raise  - 1 x daily - 3-4 x weekly - 3 sets - 10-12 reps - Mini Squat with Counter Support  - 1 x daily - 3-4 x weekly - 3 sets - 10-12 reps - Supine 90/90 Shoulder Flexion with Abdominal Bracing  - 1 x daily - 3-4 x weekly - 2-3 sets - 10 reps - Supine 90/90 Abdominal Bracing  - 2 x daily - 3-4 x weekly - 5 sets - 15-30s hold - Supine 90/90 Overhead Dumbbell Raise  - 2 x daily - 3-4 x weekly - 2-3 sets - 10 reps   ASSESSMENT:  CLINICAL IMPRESSION: Patient returns to OPPT in management of lower back pain and R hip pain. Good tolerance to increase in repetitions in core stability. Mod cues for coordination with dead bug exercise and maintaining neutral pelvic alignment. Patient still with moderate lower back pain in certain positions however her activity tolerance increasing and pt endorsed improved energy level. PT will continue improving activity tolerance within patient's pain tolerance. Her deficits in activity tolerance, LE strength and core stability prevent her from full participation with recreational activities and basic ADLs. Based on her presentation pt will benefit from skilled physical therapy in  order to address her current deficits and maximize return to PLOF.  OBJECTIVE IMPAIRMENTS: Abnormal gait, decreased activity tolerance, decreased balance, decreased coordination, decreased endurance, difficulty walking, decreased ROM, decreased strength, impaired flexibility, postural dysfunction, and pain.   ACTIVITY LIMITATIONS: carrying, lifting, bending, sitting, standing, squatting, sleeping, stairs, and transfers  PARTICIPATION LIMITATIONS: shopping, community activity, and yard work  PERSONAL FACTORS: Age, Past/current experiences, Time since onset of injury/illness/exacerbation, and 1-2 comorbidities: 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), chronic LBP, HTN.  are also affecting patient's functional outcome.   REHAB POTENTIAL: Fair Hx of CA and chronicity of pain  CLINICAL DECISION MAKING: Evolving/moderate complexity  EVALUATION COMPLEXITY: Moderate   GOALS:  Goals reviewed with patient? No  SHORT TERM GOALS: Target date: 02/22/2023  Pt will be independent with HEP in order to improve strength and decrease back pain to improve pain-free function at home and work. Baseline: 01/25/2024: Initial HEP Provided Goal status: INITIAL   LONG TERM GOALS: Target date: 03/21/2024   Pt will increase 30s Sit to Stand by at least 6 reps in order to demonstrate significant improvement in lower extremity strength and endurance.   Baseline: 01/25/2024: To Be Tested; 03/07/2024: 10 reps Goal status: INITIAL  2.  Pt will decrease worst back pain by at least 2 points on the NPRS in order to demonstrate clinically significant reduction in back pain. Baseline: 01/25/2024:07/10 Goal status: INITIAL  3.  Pt will decrease mODI score by at least 13 points in order demonstrate clinically significant reduction in back pain/disability.       Baseline: 01/25/2024: 10/50;  Goal status: INITIAL  4.  Pt will increase by at least 0.13 m/s in order to demonstrate clinically  significant improvement in community ambulation.  Baseline: 01/25/2024: 1.00 m/s (9.92s - self selected) Goal status: INITIAL  5. Pt will increase Hip and Knee strength of by at least 1/2 MMT grade in order to demonstrate improvement in strength and function with walking and transfer.  Baseline: 01/25/2024:  MMT (out of 5) Right  Left   Hip flexion 4- 4-  Hip extension    Hip abduction (Seated) 4 4  Hip adduction 5 5  Hip internal rotation    Hip external rotation    Knee flexion 4- 4-  Knee extension 4- 4-   Goal status: INITIAL   PLAN: PT FREQUENCY: 2x/week   PT DURATION: 12 weeks   PLANNED INTERVENTIONS: Therapeutic exercises, Therapeutic activity, Neuromuscular re-education, Balance training, Gait training, Patient/Family education, Self Care, Joint mobilization, Joint manipulation, DME instructions, Dry Needling, Electrical stimulation, Spinal manipulation, Spinal mobilization, Cryotherapy, Moist heat, Manual therapy, and Re-evaluation.   PLAN FOR NEXT SESSION: Progress Hip strengthening, progress core strengthening, functional training (stairs, transfers in bed)   Lonni Pall PT, DPT Physical Therapist- [place]  03/16/2024, 5:13 PM  "

## 2024-03-17 ENCOUNTER — Inpatient Hospital Stay: Payer: Medicare (Managed Care) | Admitting: Oncology

## 2024-03-17 ENCOUNTER — Inpatient Hospital Stay: Payer: Medicare (Managed Care) | Attending: Oncology

## 2024-03-17 VITALS — BP 133/61 | HR 79 | Temp 97.6°F | Resp 19 | Ht 65.0 in | Wt 202.2 lb

## 2024-03-17 DIAGNOSIS — C50912 Malignant neoplasm of unspecified site of left female breast: Secondary | ICD-10-CM

## 2024-03-17 DIAGNOSIS — Z79811 Long term (current) use of aromatase inhibitors: Secondary | ICD-10-CM

## 2024-03-17 DIAGNOSIS — Z08 Encounter for follow-up examination after completed treatment for malignant neoplasm: Secondary | ICD-10-CM | POA: Diagnosis not present

## 2024-03-17 DIAGNOSIS — Z79899 Other long term (current) drug therapy: Secondary | ICD-10-CM

## 2024-03-17 DIAGNOSIS — Z853 Personal history of malignant neoplasm of breast: Secondary | ICD-10-CM | POA: Diagnosis not present

## 2024-03-17 DIAGNOSIS — M85852 Other specified disorders of bone density and structure, left thigh: Secondary | ICD-10-CM | POA: Diagnosis not present

## 2024-03-17 LAB — CMP (CANCER CENTER ONLY)
ALT: 26 U/L (ref 0–44)
AST: 28 U/L (ref 15–41)
Albumin: 4.3 g/dL (ref 3.5–5.0)
Alkaline Phosphatase: 69 U/L (ref 38–126)
Anion gap: 11 (ref 5–15)
BUN: 20 mg/dL (ref 8–23)
CO2: 26 mmol/L (ref 22–32)
Calcium: 10.9 mg/dL — ABNORMAL HIGH (ref 8.9–10.3)
Chloride: 101 mmol/L (ref 98–111)
Creatinine: 1.24 mg/dL — ABNORMAL HIGH (ref 0.44–1.00)
GFR, Estimated: 47 mL/min — ABNORMAL LOW
Glucose, Bld: 129 mg/dL — ABNORMAL HIGH (ref 70–99)
Potassium: 4.3 mmol/L (ref 3.5–5.1)
Sodium: 139 mmol/L (ref 135–145)
Total Bilirubin: 0.5 mg/dL (ref 0.0–1.2)
Total Protein: 7.8 g/dL (ref 6.5–8.1)

## 2024-03-17 NOTE — Progress Notes (Unsigned)
 Patient doing good; no new or acute concerns at this time.

## 2024-03-17 NOTE — Progress Notes (Unsigned)
 "    Hematology/Oncology Consult note [facility]  [phone] [phone]  Patient Care Team: [name], MD as PCP - General (Family Medicine) Center, [facility], [place]asta POUR, RN as Oncology Nurse Navigator Lenn Aran, MD as Consulting Physician (Radiation Oncology) Melanee Annah BROCKS, MD as Consulting Physician (Oncology)   Name of the patient: Alexandria Fry  [id number]  06-24-1955   Date of visit: 03/17/24  Diagnosis- ***  Chief complaint/ Reason for visit- ***  Heme/Onc history: ***  Interval history- ***  ECOG PS- *** Pain scale- *** Opioid associated constipation- ***  Review of systems- ROS    Allergies[1]   Past Medical History:  Diagnosis Date   Allergic rhinitis    Allergy Age 69   Borax detergent...hives   Arthritis 11/22   Diabetes mellitus without complication (HCC)    Difficult intubation    a.) limited cervical ROM; b.) anterior anatomical airway   Fatty liver    GERD (gastroesophageal reflux disease)    Hirsutism    Hypertension    Irregular menses    with neg endometrial biopsy 2013   Menorrhagia    Murmur, cardiac    PCOS (polycystic ovarian syndrome)    Pneumonia 2014   PONV (postoperative nausea and vomiting)    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)    Thyroid  nodule      Past Surgical History:  Procedure Laterality Date   ABDOMINAL HYSTERECTOMY     AXILLARY SENTINEL NODE BIOPSY Left 11/11/2023   Procedure: BIOPSY, LYMPH NODE, SENTINEL, AXILLARY;  Surgeon: Jordis Laneta FALCON, MD;  Location: [facility];  Service: General;  Laterality: Left;   BREAST BIOPSY Left 10/28/2023   u/s bx heart clip path pending   BREAST BIOPSY Left 10/28/2023   US  LT BREAST BX W LOC DEV 1ST LESION IMG BX SPEC US  GUIDE 10/28/2023 [facility]   BREAST BIOPSY Left 11/09/2023   US  LT BREAST SAVI/RF TAG 1ST LESION US  GUIDE 11/09/2023 [facility]   BREAST LUMPECTOMY WITH RADIO  FREQUENCY LOCALIZER Left 11/11/2023   Procedure: BREAST LUMPECTOMY WITH RADIO FREQUENCY LOCALIZER;  Surgeon: Jordis Laneta FALCON, MD;  Location: [facility];  Service: General;  Laterality: Left;   CERVICAL CONE BIOPSY     COLONOSCOPY WITH PROPOFOL  N/A 01/18/2018   Procedure: COLONOSCOPY WITH PROPOFOL ;  Surgeon: [name], MD;  Location: [facility]COPY;  Service: Endoscopy;  Laterality: N/A;   COLONOSCOPY WITH PROPOFOL  N/A 02/25/2022   Procedure: COLONOSCOPY WITH PROPOFOL ;  Surgeon: [name], MD;  Location: [facility] ENDOSCOPY;  Service: Gastroenterology;  Laterality: N/A;   CYSTOSCOPY  11/11/2015   Procedure: CYSTOSCOPY;  Surgeon: [name], MD;  Location: [facility];  Service: Gynecology;;   DILATATION & CURETTAGE/HYSTEROSCOPY WITH MYOSURE N/A 03/21/2015   Procedure: DILATATION & CURETTAGE/HYSTEROSCOPY, POLYPECTOMY;  Surgeon: [name], MD;  Location: [facility];  Service: Gynecology;  Laterality: N/A;   DILATION AND CURETTAGE OF UTERUS  1981   [facility]    DILATION AND CURETTAGE OF UTERUS  06/25/2009   normal (Dr. [name])   LAPAROSCOPIC BILATERAL SALPINGECTOMY Bilateral 11/11/2015   Procedure: LAPAROSCOPIC BILATERAL SALPINGECTOMY;  Surgeon: [name], MD;  Location: [facility];  Service: Gynecology;  Laterality: Bilateral;   LAPAROSCOPIC SUPRACERVICAL HYSTERECTOMY  11/11/2015   Procedure: LAPAROSCOPIC SUPRACERVICAL HYSTERECTOMY CONVERTED TO OPEN;  Surgeon: [name], MD;  Location: [facility];  Service: Gynecology;;   LAPAROTOMY  11/11/2015   Procedure: LAPAROTOMY;  Surgeon: [name],  MD;  Location: [facility];  Service: Gynecology;;   VAGINAL DELIVERY     NSVD x 2    Social History   Socioeconomic History   Marital status: Married    Spouse name: Not on file   Number of children: 2   Years of education: Not on file   Highest education level: Associate degree: academic program  Occupational History    Employer: [employer]    Comment: Buyer  Tobacco Use    Smoking status: Former    Current packs/day: 0.00    Average packs/day: 1 pack/day for 10.0 years (10.0 ttl pk-yrs)    Types: Cigarettes    Start date: 01/21/1972    Quit date: 01/20/1982    Years since quitting: 42.1    Passive exposure: Past   Smokeless tobacco: Never   Tobacco comments:    quit for 28 years  Vaping Use   Vaping status: Never Used  Substance and Sexual Activity   Alcohol use: Yes    Comment: ~6 drinks per week.   Drug use: No   Sexual activity: Yes  Other Topics Concern   Not on file  Social History Narrative   Lives with husband, 38   1 son at home   1 son out of the house   Social Drivers of Health   Tobacco Use: Medium Risk (03/15/2024)   Patient History    Smoking Tobacco Use: Former    Smokeless Tobacco Use: Never    Passive Exposure: Past  Physicist, Medical Strain: Low Risk (12/05/2023)   Overall Financial Resource Strain (CARDIA)    Difficulty of Paying Living Expenses: Not hard at all  Food Insecurity: No Food Insecurity (12/23/2023)   Epic    Worried About Programme Researcher, Broadcasting/film/video in the Last Year: Never true    Ran Out of Food in the Last Year: Never true  Transportation Needs: No Transportation Needs (12/23/2023)   Epic    Lack of Transportation (Medical): No    Lack of Transportation (Non-Medical): No  Physical Activity: Inactive (12/23/2023)   Exercise Vital Sign    Days of Exercise per Week: 0 days    Minutes of Exercise per Session: 0 min  Stress: Stress Concern Present (12/23/2023)   [employer] of Occupational Health - Occupational Stress Questionnaire    Feeling of Stress: To some extent  Social Connections: Moderately Isolated (12/23/2023)   Social Connection and Isolation Panel    Frequency of Communication with Friends and Family: Once a week    Frequency of Social Gatherings with Friends and Family: More than three times a week    Attends Religious Services: Never    Database Administrator or Organizations: No    Attends  Banker Meetings: Never    Marital Status: Married  Catering Manager Violence: Not At Risk (12/23/2023)   Epic    Fear of Current or Ex-Partner: No    Emotionally Abused: No    Physically Abused: No    Sexually Abused: No  Depression (PHQ2-9): Low Risk (03/17/2024)   Depression (PHQ2-9)    PHQ-2 Score: 0  Alcohol Screen: Low Risk (12/05/2023)   Alcohol Screen    Last Alcohol Screening Score (AUDIT): 4  Housing: Unknown (12/23/2023)   Epic    Unable to Pay for Housing in the Last Year: No    Number of Times Moved in the Last Year: Not on file    Homeless in the Last Year: No  Utilities: Not  At Risk (12/23/2023)   Epic    Threatened with loss of utilities: No  Health Literacy: Adequate Health Literacy (12/23/2023)   B1300 Health Literacy    Frequency of need for help with medical instructions: Never    Family History  Problem Relation Age of Onset   Diabetes Mother    Hypertension Mother    Colon cancer Mother 63   Breast cancer Mother 3   Osteoporosis Mother    Arthritis Mother    Cancer Mother 74   Kidney disease Mother    Obesity Mother    Pancreatic cancer Father 24   Colon polyps Father    Alcohol abuse Father    Heart disease Father    Cancer Father    Early death Sister 9       suicide   Anxiety disorder Sister    Diabetes Brother    Hypertension Brother    Kidney disease Brother    Obesity Brother    ADD / ADHD Son    ADD / ADHD Son    Thyroid  disease Neg Hx     Current Medications[2]  Physical exam:  Vitals:   03/17/24 1414 03/17/24 1418  BP: (!) 144/58 133/61  Pulse: 79   Resp: 19   Temp: 97.6 F (36.4 C)   TempSrc: Tympanic   SpO2: 96%   Weight: 202 lb 3.2 oz (91.7 kg)   Height: 5' 5 (1.651 m)    Physical Exam   I have personally reviewed labs listed below:    Latest Ref Rng & Units 03/06/2024    9:49 AM  CMP  Glucose 70 - 99 mg/dL 837   BUN 6 - 23 mg/dL 22   Creatinine 9.59 - 1.20 mg/dL 8.79   Sodium 864 - 854 mEq/L  139   Potassium 3.5 - 5.1 mEq/L 4.3   Chloride 96 - 112 mEq/L 102   CO2 19 - 32 mEq/L 29   Calcium 8.4 - 10.5 mg/dL 88.9       Latest Ref Rng & Units 02/02/2024    1:25 PM  CBC  WBC 4.0 - 10.5 K/uL 9.3   Hemoglobin 12.0 - 15.0 g/dL 84.0   Hematocrit 63.9 - 46.0 % 47.4   Platelets 150 - 400 K/uL 189    I have personally reviewed Radiology images listed below: No images are attached to the encounter.  No results found.   Assessment and plan- Patient is a 69 y.o. female ***   Visit Diagnosis No diagnosis found.   Dr. Annah Skene, MD, MPH [facility] at [facility] [phone] 03/17/2024 2:20 PM                   [1]  Allergies Allergen Reactions   Amlodipine  Other (See Comments) and Swelling    edema   Amoxicillin  Anaphylaxis   Azithromycin Rash   Hydrochlorothiazide Other (See Comments)    H/o sulfa allergy   Penicillins Other (See Comments)    Tongue swells    Pravachol  [Pravastatin ] Diarrhea and Nausea And Vomiting    Tongue swelling   Sulfa Antibiotics Other (See Comments)    Tongue swells   Ace Inhibitors Other (See Comments)    Would avoid.  She has h/o lip swelling with other meds.    Albuterol      Pulse elevation.   Angiotensin Receptor Blockers Other (See Comments)    Would avoid.  She has h/o lip swelling with other meds.    Clonidine  And  Derivatives Other (See Comments)    bradycardia   Diphenhydramine Hcl     Benadryl cream causes local skin irritation.  Would avoid med.  She can tolerate zyrtec.    Fentanyl  Other (See Comments)    She felt diffusely awful while on med   Hydrochlorothiazide-Triamterene     Possible hives and GI upset   Hydrocodone     intolerant   Metformin And Related     rash   Metoprolol      Bradycardia.   Nitrofurantoin Other (See Comments)    rash   Oxycodone Other (See Comments)    intolerant   Tetanus Toxoid-Containing Vaccines     Local swelling, can consider split dosing 2 weeks apart    Cefuroxime Axetil Rash    Rash   Levofloxacin In D5w Itching    Severe itching and flushing.   Tetanus Toxoid, Adsorbed Rash  [2]  Current Outpatient Medications:    Cyanocobalamin (B-12) 3000 MCG CAPS, Take 1 tablet by mouth daily., Disp: , Rfl:    empagliflozin  (JARDIANCE ) 10 MG TABS tablet, Take 1 tablet (10 mg total) by mouth every other day., Disp: , Rfl:    hydrocortisone  (PROCTOZONE -HC) 2.5 % rectal cream, Place 1 Application rectally 2 (two) times daily as needed for hemorrhoids or anal itching., Disp: 30 g, Rfl: 0   letrozole  (FEMARA ) 2.5 MG tablet, Take 1 tablet (2.5 mg total) by mouth daily., Disp: 90 tablet, Rfl: 1   Multiple Vitamin (MULTIVITAMIN) tablet, Take 1 tablet by mouth daily., Disp: , Rfl:    spironolactone  (ALDACTONE ) 50 MG tablet, TAKE 1 TABLET BY MOUTH EVERY DAY, Disp: 90 tablet, Rfl: 0   triamcinolone  cream (KENALOG ) 0.5 %, Apply 1 Application topically 2 (two) times daily as needed., Disp: 454 g, Rfl: 0   Calcium Carb-Cholecalciferol (CALCIUM-VITAMIN D  PO), Take by mouth in the morning and at bedtime. (Patient not taking: Reported on 03/17/2024), Disp: , Rfl:    loteprednol (LOTEMAX) 0.2 % SUSP, As needed, Disp: , Rfl:    VITAMIN D , CHOLECALCIFEROL, PO, Take 5,000 Units by mouth. (Patient not taking: Reported on 03/17/2024), Disp: , Rfl:   "

## 2024-03-20 ENCOUNTER — Other Ambulatory Visit: Payer: Medicare (Managed Care)

## 2024-03-20 ENCOUNTER — Other Ambulatory Visit

## 2024-03-22 ENCOUNTER — Ambulatory Visit: Attending: Family Medicine

## 2024-03-22 ENCOUNTER — Ambulatory Visit
Admission: RE | Admit: 2024-03-22 | Discharge: 2024-03-22 | Payer: Self-pay | Attending: Radiation Oncology | Admitting: Radiation Oncology

## 2024-03-22 ENCOUNTER — Encounter: Payer: Self-pay | Admitting: Radiation Oncology

## 2024-03-22 VITALS — BP 116/74 | HR 72 | Temp 97.4°F | Resp 16 | Wt 201.0 lb

## 2024-03-22 DIAGNOSIS — M6281 Muscle weakness (generalized): Secondary | ICD-10-CM

## 2024-03-22 DIAGNOSIS — M5459 Other low back pain: Secondary | ICD-10-CM

## 2024-03-22 DIAGNOSIS — M5416 Radiculopathy, lumbar region: Secondary | ICD-10-CM

## 2024-03-22 DIAGNOSIS — C50412 Malignant neoplasm of upper-outer quadrant of left female breast: Secondary | ICD-10-CM

## 2024-03-22 NOTE — Progress Notes (Signed)
 Radiation Oncology Follow up Note  Name: Alexandria Fry   Date:   03/22/2024 MRN:  [id number] DOB: [birth year]    This 69 y.o. 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  Seen today in routine follow-up she is doing well.  She specifically denies breast tenderness cough or bone pain.  She has been started on.  Letrozole  is having some hot flashes for which I recommended some vitamin D  supplements.  She is also having some problems with her calcium and that is being managed by medical oncology.  COMPLICATIONS OF TREATMENT: none  FOLLOW UP COMPLIANCE: keeps appointments   PHYSICAL EXAM:  BP 116/74   Pulse 72   Temp (!) 97.4 F (36.3 C) (Tympanic)   Resp 16   Wt 201 lb (91.2 kg)   LMP  (LMP Unknown)   BMI 33.45 kg/m  Lungs are clear to A&P cardiac examination essentially unremarkable with regular rate and rhythm. No dominant mass or nodularity is noted in either breast in 2 positions examined. Incision is well-healed. No axillary or supraclavicular adenopathy is appreciated. Cosmetic result is excellent.  Well-developed well-nourished patient in NAD. HEENT reveals PERLA, EOMI, discs not visualized.  Oral cavity is clear. No oral mucosal lesions are identified. Neck is clear without evidence of cervical or supraclavicular adenopathy. Lungs are clear to A&P. Cardiac examination is essentially unremarkable with regular rate and rhythm without murmur rub or thrill. Abdomen is benign with no organomegaly or masses noted. Motor sensory and DTR levels are equal and symmetric in the upper and lower extremities. Cranial nerves II through XII are grossly intact. Proprioception  is intact. No peripheral adenopathy or edema is identified. No motor or sensory levels are noted. Crude visual fields are within normal range.  RADIOLOGY RESULTS: No current films for review  PLAN: At the present time patient is doing well no significant side effect from whole breast radiation I am pleased with her overall progress.  Of asked to see her back in 6 months for follow-up.  She continues on letrozole .  Again have recommended some vitamin D  supplements for hot flashes.  Patient is to call with any concerns.  I would like to take this opportunity to thank you for allowing me to participate in the care of your patient.SABRA Marcey Penton, MD

## 2024-03-22 NOTE — Progress Notes (Signed)
 Agree.  Thanks.  Arlyss EDISON Cleatus, MD 03/22/24  6:07 PM

## 2024-03-22 NOTE — Therapy (Signed)
 " OUTPATIENT PHYSICAL THERAPY THORACOLUMBAR TREATMENT/RE CERTIFICATION   Patient Name: Alexandria Fry MRN: [id number] DOB:[birth year], 69 y.o., female Today's Date: 03/22/2024  END OF SESSION:  PT End of Session - 03/22/24 1602     Visit Number 9    Number of Visits 17    Date for Recertification  05/17/24    Progress Note Due on Visit 10    PT Start Time 1601    PT Stop Time 1640    PT Time Calculation (min) 39 min    Activity Tolerance Patient tolerated treatment well    Behavior During Therapy [facility] for tasks assessed/performed              Past Medical History:  Diagnosis Date   Allergic rhinitis    Allergy Age 33   Borax detergent...hives   Arthritis 11/22   Diabetes mellitus without complication (HCC)    Difficult intubation    a.) limited cervical ROM; b.) anterior anatomical airway   Fatty liver    GERD (gastroesophageal reflux disease)    Hirsutism    Hypertension    Irregular menses    with neg endometrial biopsy 2013   Menorrhagia    Murmur, cardiac    PCOS (polycystic ovarian syndrome)    Pneumonia 2014   PONV (postoperative nausea and vomiting)    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)    Thyroid  nodule    Past Surgical History:  Procedure Laterality Date   ABDOMINAL HYSTERECTOMY     AXILLARY SENTINEL NODE BIOPSY Left 11/11/2023   Procedure: BIOPSY, LYMPH NODE, SENTINEL, AXILLARY;  Surgeon: Jordis Laneta FALCON, MD;  Location: [facility] OR[facility];  Service: General;  Laterality: Left;   BREAST BIOPSY Left 10/28/2023   u/s bx heart clip path pending   BREAST BIOPSY Left 10/28/2023   US  LT BREAST BX W LOC DEV 1ST LESION IMG BX SPEC US  GUIDE 10/28/2023 [facility]   BREAST BIOPSY Left 11/09/2023   US  LT BREAST SAVI/RF TAG 1ST LESION US  GUIDE 11/09/2023 [facility]   BREAST LUMPECTOMY WITH RADIO FREQUENCY LOCALIZER Left 11/11/2023   Procedure: BREAST LUMPECTOMY WITH RADIO FREQUENCY LOCALIZER;  Surgeon: Jordis Laneta FALCON, MD;  Location: [facility];  Service: General;  Laterality: Left;   CERVICAL CONE BIOPSY     COLONOSCOPY WITH PROPOFOL  N/A 01/18/2018   Procedure: COLONOSCOPY WITH PROPOFOL ;  Surgeon: [name], MD;  Location: [facility]OS[facility];  Service: Endoscopy;  Laterality: N/A;   COLONOSCOPY WITH PROPOFOL  N/A 02/25/2022   Procedure: COLONOSCOPY WITH PROPOFOL ;  Surgeon: [name], MD;  Location: [facility] END[facility];  Service: Gastroenterology;  Laterality: N/A;   CYSTOSCOPY  11/11/2015   Procedure: CYSTOSCOPY;  Surgeon: [name], MD;  Location: [facility];  Service: Gynecology;;   DILATATION & CURETTAGE/HYSTEROSCOPY WITH MYOSURE N/A 03/21/2015   Procedure: DILATATION & CURETTAGE/HYSTEROSCOPY, POLYPECTOMY;  Surgeon: [name], MD;  Location: [facility];  Service: Gynecology;  Laterality: N/A;   DILATION AND CURETTAGE OF UTERUS  1981   [facility] BX    DILATION AND CURETTAGE OF UTERUS  06/25/2009   normal (Dr. [name])   LAPAROSCOPIC BILATERAL SALPINGECTOMY Bilateral 11/11/2015   Procedure: LAPAROSCOPIC BILATERAL SALPINGECTOMY;  Surgeon: [name], MD;  Location: [facility];  Service: Gynecology;  Laterality: Bilateral;   LAPAROSCOPIC SUPRACERVICAL HYSTERECTOMY  11/11/2015   Procedure: LAPAROSCOPIC SUPRACERVICAL HYSTERECTOMY CONVERTED TO OPEN;  Surgeon: [name], MD;  Location: [facility];  Service: Gynecology;;   LAPAROTOMY  11/11/2015   Procedure: LAPAROTOMY;  Surgeon: [name], MD;  Location: [facility];  Service: Gynecology;;   VAGINAL DELIVERY     NSVD x 2   Patient Active Problem List   Diagnosis Date Noted   Elevated serum creatinine 12/12/2023   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) 11/06/2023   Decreased range of motion of shoulder, left 10/10/2023   Right hip pain 06/13/2023   Healthcare maintenance 02/21/2023   Statin intolerance 02/21/2023   Rash 02/21/2023   Dysuria 02/21/2023   Family history of colorectal cancer 02/25/2022   Adenomatous polyp of colon 02/25/2022    Medicare welcome exam 12/28/2021   Lung nodule 05/19/2021   Respiratory failure with hypoxia (HCC) 05/06/2021   AKI (acute kidney injury) 05/06/2021   Hyperandrogenemia 05/27/2020   Multinodular goiter 05/27/2020   History of UTI 05/19/2020   Hammer toe of left foot 06/22/2019   Allergy history, drug 04/10/2018   Leg pain 04/10/2018   Status post laparotomy 11/11/2015   Diabetes mellitus without complication (HCC) 10/18/2015   Hyperglycemia 03/09/2015   Advance care planning 02/07/2014   Hyperlipidemia 02/07/2014   Atypical pneumonia 09/30/2012   Vitamin D  deficiency 10/09/2008   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   Overweight 01/18/2007   Essential hypertension 01/18/2007   Allergic rhinitis 11/29/2006   FIBROCYSTIC BREAST DISEASE 11/29/2006    PCP: [name], MD  REFERRING PROVIDER: [name], MD  REFERRING DIAG:  M54.50 (ICD-10-CM) - Low back pain, unspecified back pain laterality, unspecified chronicity, unspecified whether sciatica present    RATIONALE FOR EVALUATION AND TREATMENT: Rehabilitation  THERAPY DIAG:   ONSET DATE: Chronic   FOLLOW-UP APPT SCHEDULED WITH REFERRING PROVIDER: No    SUBJECTIVE:                                                                                                                                                                                         SUBJECTIVE STATEMENT:    Patient reports to OPPT with acute on chronic lower back pain.   PERTINENT HISTORY:   Alexandria Fry is a 69 y.o female with chief concern of lower back pain. She has had previous bout of physical therapy for same diagnosis but had to take a break due other medical concerns. She is receiving radiation therapy every day for her current diagnosis. She reports that her lower back has worsened due to the position of the radiation chair and recent sedentary lifestyle. She reports that she's always fatigued and contributes it to the cancer treatments. She's  also having intermittent L sided hip pain. She has noticed that she has developed muscle weakness in her BLE.  She denies bowel/bladder changes, saddle paresthesia, h/o abdominal aneurysm, abdominal pain, chills/fever, night sweats.    PAIN:    Pain Intensity: Present: 2/10, Best: 0/10, Worst: 7/10 Pain location: Lower Back Pain Quality: intermittent, sharp, and stabbing  Radiating: Yes  Numbness/Tingling: No Focal Weakness: No Aggravating factors: Prolonged walking, sitting, standing  Relieving factors: Positional Changes  History of prior back injury, pain, surgery, or therapy: Yes  PRECAUTIONS: Fall  WEIGHT BEARING RESTRICTIONS: No  FALLS: Has patient fallen in last 6 months? No  Living Environment Lives with: lives with their family Lives in: House/apartment Stairs: Yes: Internal: 8 steps; can reach both Has following equipment at home: None  Prior level of function: Independent  Occupational demands: Retired  Hobbies:Reading   Patient Goals: I want to walk longer and do more activities   OBJECTIVE:  Patient Surveys  moDI: TBD   Cognition WNL    Gross Musculoskeletal Assessment Tremor: None Bulk: Normal Tone: Normal No visible step-off along spinal column, no signs of scoliosis  GAIT: Distance walked: 5m  Assistive device utilized: Single point cane Level of assistance: Complete Independence Comments: Reciprocal gait, L hip pain.   Posture: Lumbar lordosis: WNL Iliac crest height: Equal bilaterally Lumbar lateral shift: Negative  AROM AROM (Normal range in degrees) AROM   Lumbar   Flexion (65) 50%  Extension (30) 25%  Right lateral flexion (25) 100%  Left lateral flexion (25) 100%  Right rotation (30)   Left rotation (30)       Hip Right Left  Flexion (125) WNL WNL  Extension (15)    Abduction (40)    Adduction     Internal Rotation (45)    External Rotation (45)        Knee    Flexion (135)    Extension (0)        Ankle     Dorsiflexion (20)    Plantarflexion (50)    Inversion (35)    Eversion (15)    (* = pain; Blank rows = not tested)  LE MMT: MMT (out of 5) Right  Left   Hip flexion 4- 4-  Hip extension    Hip abduction (Seated) 4 4  Hip adduction 5 5  Hip internal rotation    Hip external rotation    Knee flexion 4- 4-  Knee extension 4- 4-  Ankle dorsiflexion 5 5  Ankle plantarflexion    Ankle inversion    Ankle eversion    (* = pain; Blank rows = not tested)  Sensation Grossly intact to light touch throughout bilateral LEs as determined by testing dermatomes L2-S2. Proprioception, stereognosis, and hot/cold testing deferred on this date.  Reflexes R/L Knee Jerk (L3/4): 2+/2+  Ankle Jerk (S1/2): 2+/2+   Muscle Length Hamstrings: R: Positive L: Positive  Palpation Location Right Left         Lumbar paraspinals 1 1  Quadratus Lumborum    Gluteus Maximus    Gluteus Medius    Deep hip external rotators    PSIS    Fortin's Area (SIJ)    Greater Trochanter    (Blank rows = not tested) Graded on 0-4 scale (0 = no pain, 1 = pain, 2 = pain with wincing/grimacing/flinching, 3 = pain with withdrawal, 4 = unwilling to allow palpation)  Passive Accessory  Motion Deferred   Special Tests Lumbar Radiculopathy and Discogenic:  SLR (SN 92, -LR 0.29): R: Positive L:  Negative Crossed SLR (SP 90): R: Negative L: Negative  Hip: FABER (SN 81): R: Positive L: Negative FADIR (SN 94): R: Negative L: Negative  TODAY'S TREATMENT: DATE: 03/22/24  Subjective: Patient reports 1/10 around ischial tuberosity and deep gluteal musculature. Patient reports that she provoked her lower back pain with inactivity however she was able to resolve her pain with adherence to HEP.    No further questions or concerns.  Physical Performance Measure (4 min unbilled):   MMT: See goals below   : See goals below  30s STS: 14.5 reps  The 30s STS test assesses lower body strength and endurance by  measuring how many times a person can stand up from and sit down on a chair in 30 seconds   Therapeutic Exercise:  Supine 90/90 with OH reach   3 x 10 - weighted dowel   Hookying OH reach with weighted dowel  3 x 10 - YTB in the middle   Therapeutic Activity: NuStep L5-2 x 6 min x UE/LE (Seat 8) x for LE endurance and strength; PT manually adjusted resistance throughout per patient tolerance.   Partial Squats - Red TB around lower leg   3 x 10 - 3 Kg MB   Lateral Stepping against TB   4 x 12' Red TB around ankle  4 x 12' Red TB around ankle   Standing Pallof Press   R/L: 1 x 10 - 5#, 1 x 10 - 7.5# ea dir  Standing Horizontal Torso Rotation against resistance   R/L dir: 2 x 10 - Green TB  PATIENT EDUCATION:  Education details: Exercise Technique  Person educated: Patient Education method: Medical Illustrator Education comprehension: verbalized understanding and returned demonstration   HOME EXERCISE PROGRAM:  Access Code: [email or web address] URL: [email or web address] Date: 02/03/2024 Prepared by: Lonni Jasie Meleski  Exercises - Supine Posterior Pelvic Tilt  - 2 x daily - 7 x weekly - 3 sets - 10 reps - 5 hold - Supine Active Straight Leg Raise  - 1 x daily - 3-4 x weekly - 3 sets - 10-12 reps - Mini Squat with Counter Support  - 1 x daily - 3-4 x weekly - 3 sets - 10-12 reps - Supine 90/90 Shoulder Flexion with Abdominal Bracing  - 1 x daily - 3-4 x weekly - 2-3 sets - 10 reps - Supine 90/90 Abdominal Bracing  - 2 x daily - 3-4 x weekly - 5 sets - 15-30s hold - Supine 90/90 Overhead Dumbbell Raise  - 2 x daily - 3-4 x weekly - 2-3 sets - 10 reps   ASSESSMENT:  CLINICAL IMPRESSION: Patient returns to OPPT in management of lower back pain and R hip pain. Good tolerance to increase in repetitions in core stability. Good participation throughout the session without exacerbation of pain. Goals reassessed in today's session; patient demonstrates improvements  with LE strength (See goals below). remained unchanged indicating her gait speed still limited due to LBP. Patient still with moderate lower back pain in certain positions however her activity tolerance increasing and pt endorsed improved energy level. Patient's condition has the potential to improve in response to therapy. Maximum improvement is yet to be obtained. The anticipated improvement is attainable and reasonable in a generally predictable time.  Her deficits in activity tolerance, LE strength and core stability prevent her from full participation with recreational activities and basic ADLs. PT requesting for 2x/week for 8 weeks in order to improve patient's LOF and overall QoL.  OBJECTIVE IMPAIRMENTS: Abnormal gait, decreased activity tolerance, decreased balance, decreased coordination, decreased endurance,  difficulty walking, decreased ROM, decreased strength, impaired flexibility, postural dysfunction, and pain.   ACTIVITY LIMITATIONS: carrying, lifting, bending, sitting, standing, squatting, sleeping, stairs, and transfers  PARTICIPATION LIMITATIONS: shopping, community activity, and yard work  PERSONAL FACTORS: Age, Past/current experiences, Time since onset of injury/illness/exacerbation, and 1-2 comorbidities: 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), chronic LBP, HTN.  are also affecting patient's functional outcome.   REHAB POTENTIAL: Fair Hx of CA and chronicity of pain  CLINICAL DECISION MAKING: Evolving/moderate complexity  EVALUATION COMPLEXITY: Moderate   GOALS: Goals reviewed with patient? No  SHORT TERM GOALS: Target date: 02/22/2023  Pt will be independent with HEP in order to improve strength and decrease back pain to improve pain-free function at home and work. Baseline: 01/25/2024: Initial HEP Provided; 03/22/2024: 60% adherence  Goal status: Progressing   LONG TERM GOALS: Target date: 05/17/2024   Pt will increase 30s Sit to Stand  by at least 6 reps in order to demonstrate significant improvement in lower extremity strength and endurance.   Baseline: 01/25/2024: To Be Tested; 03/07/2024: 10 reps; 03/22/2024: 14.5 reps   Goal status: INITIAL  2.  Pt will decrease worst back pain by at least 2 points on the NPRS in order to demonstrate clinically significant reduction in back pain. Baseline: 01/25/2024:07/10; 03/22/2024: 6/10 NPS Goal status: Progressing  3.  Pt will decrease mODI score by at least 13 points in order demonstrate clinically significant reduction in back pain/disability.       Baseline: 01/25/2024: 10/50; 03/22/2024:TBD  Goal status: INITIAL  4.  Pt will increase by at least 0.13 m/s in order to demonstrate clinically significant improvement in community ambulation.  Baseline: 01/25/2024: 1.00 m/s (9.92s - self selected); 03/22/2024: .98 m/s  Goal status: Progressing   5. Pt will increase Hip and Knee strength of by at least 1/2 MMT grade in order to demonstrate improvement in strength and function with walking and transfer.  Baseline: 01/25/2024:  MMT (out of 5) Right  Left  03/22/2024  R/L  Hip flexion 4- 4- 4/4  Hip extension   /  Hip abduction (Seated) 4 4 4+/4+  Hip adduction 5 5 4+/4+  Hip internal rotation     Hip external rotation     Knee flexion 4- 4- 4+/4+  Knee extension 4- 4- 5/5   Goal status: INITIAL   PLAN: PT FREQUENCY: 2x/week   PT DURATION: 12 weeks   PLANNED INTERVENTIONS: Therapeutic exercises, Therapeutic activity, Neuromuscular re-education, Balance training, Gait training, Patient/Family education, Self Care, Joint mobilization, Joint manipulation, DME instructions, Dry Needling, Electrical stimulation, Spinal manipulation, Spinal mobilization, Cryotherapy, Moist heat, Manual therapy, and Re-evaluation.   PLAN FOR NEXT SESSION: Progress Hip strengthening, progress core strengthening, functional training (stairs, transfers in bed)   Lonni Pall PT,  DPT Physical Therapist- [place]  03/22/2024, 4:03 PM  "

## 2024-03-23 ENCOUNTER — Encounter: Payer: Medicare (Managed Care) | Admitting: Family Medicine

## 2024-03-28 ENCOUNTER — Ambulatory Visit

## 2024-03-28 ENCOUNTER — Other Ambulatory Visit

## 2024-03-30 ENCOUNTER — Ambulatory Visit

## 2024-04-03 ENCOUNTER — Ambulatory Visit

## 2024-04-06 ENCOUNTER — Ambulatory Visit

## 2024-04-07 ENCOUNTER — Inpatient Hospital Stay

## 2024-04-11 ENCOUNTER — Ambulatory Visit

## 2024-04-13 ENCOUNTER — Ambulatory Visit

## 2024-04-18 ENCOUNTER — Ambulatory Visit: Attending: Family Medicine

## 2024-04-20 ENCOUNTER — Ambulatory Visit

## 2024-04-24 ENCOUNTER — Ambulatory Visit

## 2024-04-27 ENCOUNTER — Ambulatory Visit

## 2024-05-01 ENCOUNTER — Ambulatory Visit

## 2024-05-03 ENCOUNTER — Ambulatory Visit

## 2024-05-09 ENCOUNTER — Ambulatory Visit

## 2024-05-11 ENCOUNTER — Ambulatory Visit

## 2024-05-16 ENCOUNTER — Ambulatory Visit

## 2024-05-18 ENCOUNTER — Ambulatory Visit

## 2024-06-05 ENCOUNTER — Ambulatory Visit: Admitting: Family Medicine

## 2024-06-05 ENCOUNTER — Ambulatory Visit: Payer: Medicare (Managed Care) | Admitting: Family Medicine

## 2024-06-05 ENCOUNTER — Other Ambulatory Visit

## 2024-06-05 ENCOUNTER — Encounter: Payer: Medicare (Managed Care) | Admitting: Family Medicine

## 2024-06-08 ENCOUNTER — Encounter: Payer: Medicare (Managed Care) | Admitting: Family Medicine

## 2024-07-14 ENCOUNTER — Inpatient Hospital Stay: Admitting: Oncology

## 2024-09-27 ENCOUNTER — Ambulatory Visit: Admitting: Radiation Oncology

## 2024-12-26 ENCOUNTER — Ambulatory Visit: Payer: Medicare (Managed Care)
